# Patient Record
Sex: Female | Born: 1965
Health system: Southern US, Community
[De-identification: ages and names within clinical notes are randomized; demographics above are authoritative.]

## PROBLEM LIST (undated history)

## (undated) ENCOUNTER — Emergency Department (HOSPITAL_BASED_OUTPATIENT_CLINIC_OR_DEPARTMENT_OTHER): Admission: EM | Payer: 59 | Source: Home / Self Care

## (undated) DIAGNOSIS — I10 Essential (primary) hypertension: Secondary | ICD-10-CM

## (undated) DIAGNOSIS — K219 Gastro-esophageal reflux disease without esophagitis: Secondary | ICD-10-CM

## (undated) DIAGNOSIS — K635 Polyp of colon: Secondary | ICD-10-CM

## (undated) DIAGNOSIS — E669 Obesity, unspecified: Secondary | ICD-10-CM

## (undated) DIAGNOSIS — E119 Type 2 diabetes mellitus without complications: Secondary | ICD-10-CM

## (undated) HISTORY — DX: Essential (primary) hypertension: I10

## (undated) HISTORY — DX: Obesity, unspecified: E66.9

## (undated) HISTORY — PX: GASTRIC BYPASS: SHX52

## (undated) HISTORY — DX: Polyp of colon: K63.5

## (undated) HISTORY — PX: AUGMENTATION MAMMAPLASTY: SUR837

## (undated) HISTORY — DX: Gastro-esophageal reflux disease without esophagitis: K21.9

## (undated) HISTORY — DX: Type 2 diabetes mellitus without complications: E11.9

---

## 2020-11-06 DIAGNOSIS — S82851A Displaced trimalleolar fracture of right lower leg, initial encounter for closed fracture: Secondary | ICD-10-CM | POA: Diagnosis not present

## 2020-11-06 DIAGNOSIS — M25571 Pain in right ankle and joints of right foot: Secondary | ICD-10-CM | POA: Diagnosis not present

## 2020-11-07 DIAGNOSIS — S82851A Displaced trimalleolar fracture of right lower leg, initial encounter for closed fracture: Secondary | ICD-10-CM | POA: Diagnosis not present

## 2020-11-07 DIAGNOSIS — S82831A Other fracture of upper and lower end of right fibula, initial encounter for closed fracture: Secondary | ICD-10-CM | POA: Diagnosis not present

## 2020-11-07 DIAGNOSIS — S8251XA Displaced fracture of medial malleolus of right tibia, initial encounter for closed fracture: Secondary | ICD-10-CM | POA: Diagnosis not present

## 2020-11-12 DIAGNOSIS — S82851A Displaced trimalleolar fracture of right lower leg, initial encounter for closed fracture: Secondary | ICD-10-CM | POA: Diagnosis not present

## 2020-11-12 DIAGNOSIS — M25571 Pain in right ankle and joints of right foot: Secondary | ICD-10-CM | POA: Diagnosis not present

## 2020-11-15 DIAGNOSIS — Z6841 Body Mass Index (BMI) 40.0 and over, adult: Secondary | ICD-10-CM | POA: Diagnosis not present

## 2020-11-15 DIAGNOSIS — S93431A Sprain of tibiofibular ligament of right ankle, initial encounter: Secondary | ICD-10-CM | POA: Diagnosis not present

## 2020-11-15 DIAGNOSIS — M25371 Other instability, right ankle: Secondary | ICD-10-CM | POA: Diagnosis not present

## 2020-11-15 DIAGNOSIS — S82851A Displaced trimalleolar fracture of right lower leg, initial encounter for closed fracture: Secondary | ICD-10-CM | POA: Diagnosis not present

## 2020-11-15 DIAGNOSIS — I10 Essential (primary) hypertension: Secondary | ICD-10-CM | POA: Diagnosis not present

## 2020-12-03 DIAGNOSIS — S82831D Other fracture of upper and lower end of right fibula, subsequent encounter for closed fracture with routine healing: Secondary | ICD-10-CM | POA: Diagnosis not present

## 2020-12-03 DIAGNOSIS — S82851D Displaced trimalleolar fracture of right lower leg, subsequent encounter for closed fracture with routine healing: Secondary | ICD-10-CM | POA: Diagnosis not present

## 2020-12-03 DIAGNOSIS — Z09 Encounter for follow-up examination after completed treatment for conditions other than malignant neoplasm: Secondary | ICD-10-CM | POA: Diagnosis not present

## 2020-12-19 ENCOUNTER — Ambulatory Visit: Payer: 59 | Attending: Foot and Ankle Surgery | Admitting: Physical Therapy

## 2020-12-19 ENCOUNTER — Encounter: Payer: Self-pay | Admitting: Physical Therapy

## 2020-12-19 ENCOUNTER — Other Ambulatory Visit: Payer: Self-pay

## 2020-12-19 DIAGNOSIS — R262 Difficulty in walking, not elsewhere classified: Secondary | ICD-10-CM | POA: Diagnosis not present

## 2020-12-19 DIAGNOSIS — R600 Localized edema: Secondary | ICD-10-CM | POA: Insufficient documentation

## 2020-12-19 DIAGNOSIS — M25671 Stiffness of right ankle, not elsewhere classified: Secondary | ICD-10-CM | POA: Insufficient documentation

## 2020-12-19 DIAGNOSIS — M25571 Pain in right ankle and joints of right foot: Secondary | ICD-10-CM | POA: Diagnosis not present

## 2020-12-19 NOTE — Therapy (Signed)
[facility] [address], [address] [place], [place], [address] Phone: [phone]   Fax:  [phone]  Physical Therapy Evaluation  Patient Details  Name: Natasha [name] MRN: [id number] Date of Birth: 1966-03-07 Referring Provider (PT): Macie Burows, MD   Encounter Date: 12/19/2020   PT End of Session - 12/19/20 1456    Visit Number 1    Date for PT Re-Evaluation 02/13/21    Authorization Type [employer]    Authorization Time Period 12/19/20 to 02/13/21    PT Start Time 1158    PT Stop Time 1230    PT Time Calculation (min) 32 min    Activity Tolerance Patient tolerated treatment well;No increased pain    Behavior During Therapy [facility] for tasks assessed/performed           History reviewed. No pertinent past medical history.  History reviewed. No pertinent surgical history.  There were no vitals filed for this visit.    Subjective Assessment - 12/19/20 1201    Subjective Pt states that she fell and broke her ankle. She had surgery 11/15/20. She is now 50% weight bearing and is wearing her CAM boot during the day. Her pain has been minimal.    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 No/denies              [facility] PT Assessment - 12/19/20 0001      Assessment   Medical Diagnosis s/p Rt ankle ORIF    Referring Provider (PT) Macie Burows, MD    Onset Date/Surgical Date 11/15/20    Next MD Visit 12/16/20      Precautions   Precaution Comments ankle: CAM boot with ambulation      Restrictions   Other Position/Activity Restrictions 50% WB with CAM boot, increase to 100% over the next week      Balance Screen   Has the patient fallen in the past 6 months Yes    How many times? 1   injury leading to her surgery   Has the patient had a decrease in activity level because of a fear of falling?  No    Is the patient reluctant to leave their home because of a  fear of falling?  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 her      Cognition   Overall Cognitive Status Within Functional Limits for tasks assessed      ROM / Strength   AROM / PROM / Strength AROM;Strength      AROM   Overall AROM Comments Rt great toe extension 25 deg    AROM Assessment Site Ankle    Right/Left Ankle Right    Right Ankle Dorsiflexion 0    Right Ankle Plantar Flexion 25    Right Ankle Inversion 15      Palpation   Palpation comment tenderness along scar, mild adhesions noted along the scar      Ambulation/Gait   Assistive device L Axillary Crutch    Pre-Gait Activities PT adjustments to crutches for improved UE placement    Gait Comments Pt initially walking with step to pattern holding crutch in Rt arm. Following PT adjustments, she reported improvements in back discomfort and ability to ambulate upright and improved steadiness  Objective measurements completed on examination: See above findings.       [facility] Adult PT Treatment/Exercise - 12/19/20 0001      Exercises   Exercises Ankle      Ankle Exercises: Stretches   Gastroc Stretch 2 reps;30 seconds    Gastroc Stretch Limitations long sitting with strap    Other Stretch Rt plantarflexion stretch 2x30 sec    Other Stretch Rt great toe extension stretch 2x30 sec      Ankle Exercises: Supine   Other Supine Ankle Exercises Rt long sitting: PF with great toe flexion red TB x20 reps                  PT Education - 12/19/20 1454    Education Details crutch placement/set up; implemented and reviewed HEP; ice/elevation parameters; gentle scar massage    Person(s) Educated Patient    Methods Explanation;Handout;Verbal cues    Comprehension Verbalized understanding;Returned demonstration            PT Short Term Goals - 12/19/20 1520      PT SHORT TERM GOAL #1   Title Pt  will be independent with her initial HEP to increase ankle ROM, strength and decrease swelling.    Time 4    Period Weeks    Status New    Target Date 01/16/21      PT SHORT TERM GOAL #2   Title Pt will have atleast 5 deg of ankle dorsiflexion which will improve gait mechanics.    Time 4    Period Weeks    Status New      PT SHORT TERM GOAL #3   Title Pt will be able to demonstrate symmetrical gait mechanics and proper push off on the Rt during ambulation atleast 134ft, without or without AD, once cleared by her MD.    Status New             PT Long Term Goals - 12/19/20 1526      PT LONG TERM GOAL #1   Title Pt will have atleast 10 deg of active ankle DF to allow for safe walking on even surfaces.    Time 8    Period Weeks    Status New      PT LONG TERM GOAL #2   Title Pt will be able to maintain Rt single leg stance for atleast 5 sec, 2/3 trials, which will reflect improvements in ankle/foot proprioception.    Time 8    Period Weeks    Status New      PT LONG TERM GOAL #3   Title Pt will have greater than 45 deg of plantar flexion on the Rt to improve push off during ambulation.    Time 8    Period Weeks    Status New      PT LONG TERM GOAL #4   Title Pt will have improved gastroc strength, evident by her ability to complete 15 single leg heel raises on the Rt.    Time 8    Period Weeks    Status New      PT LONG TERM GOAL #5   Title Pt will be able to complete 5x sit to stand in less than 14 sec without the need for UE support and without significant weight shift off the Rt LE.    Time 8    Period Weeks    Status New  Plan - 12/19/20 1459    Clinical Impression Statement Pt is a pleasant 55 y.o F referred to OPPT s/p Rt ankle ORIF on Nov 15, 2020. Pt's pain is well controlled and she presents wearing her CAM boot. She post surgical swelling, limited ankle/foot ROM and functional weakness. Pt has dorsiflexion to neutral. She was able  to complete a series of exercises to increase ankle ROM and strength. PT also educated pt on ice/elevation and scar mobilization. PT made several adjustments to the pt's crutches to improve her gait mechanics. Pt works as a Engineer, civil (consulting) and would benefit from skilled PT to decrease post surgical swelling and address her limitations in ROM, strength and proprioception to facilitate her return to work.    Personal Factors and Comorbidities Fitness;Past/Current Experience    Examination-Activity Limitations Stairs    Stability/Clinical Decision Making Stable/Uncomplicated    Clinical Decision Making Low    Rehab Potential Excellent    PT Frequency 2x / week    PT Duration 8 weeks    PT Treatment/Interventions ADLs/Self Care Home Management;Aquatic Therapy;Cryotherapy;Stair training;Gait training;Functional mobility training;Therapeutic activities;Therapeutic exercise;Balance training;Neuromuscular re-education;Patient/family education;Dry needling;Scar mobilization;Manual techniques;Passive range of motion;Taping;Vasopneumatic Device    PT Next Visit Plan ankle ROM, progress stretches and some activities to standing if WB restrictions allow, intrinsice foot strength, gait mechanics review    PT Home Exercise Plan [email or web address]    Consulted and Agree with Plan of Care Patient           Patient will benefit from skilled therapeutic intervention in order to improve the following deficits and impairments:  Abnormal gait,Decreased activity tolerance,Hypomobility,Decreased strength,Decreased skin integrity,Decreased range of motion,Pain,Decreased balance,Decreased mobility,Difficulty walking,Increased edema,Increased muscle spasms,Decreased scar mobility  Visit Diagnosis: Stiffness of right ankle, not elsewhere classified  Pain in right ankle and joints of right foot  Localized edema  Difficulty in walking, not elsewhere classified     Problem List There are no problems to display for this  patient.   3:38 PM,12/19/20 Donita Brooks PT, DPT [facility]  [phone]  [facility] [address], [address] [place], [place], [address] Phone: [phone]   Fax:  [phone]  Name: Natasha [name] MRN: [id number] Date of Birth: 09-28-1966

## 2020-12-19 NOTE — Patient Instructions (Signed)
Access Code: [email or web address] URL: [email or web address] Date: 12/19/2020 Prepared by: [facility]  Exercises      Long Sitting Calf Stretch with Strap - 2-3 x daily - 7 x weekly - 2 sets - 30 seconds hold     Seated Ankle Plantarflexion Dorsiflexion PROM - 2-3 x daily - 7 x weekly - 2 sets - 30 second hold     Seated Self Great Toe Stretch - 2-3 x daily - 7 x weekly - 2 sets - 30 second hold     Ankle and Toe Plantarflexion with Resistance - 1-2 x daily - 7 x weekly - 1 sets - 20 reps     Seated Ankle Circles - 1 x daily - 7 x weekly - 3 sets - 10 reps   [facility] [address], [address] [place], [place] [address] Phone # [phone] Fax [phone]

## 2020-12-26 ENCOUNTER — Ambulatory Visit: Payer: 59 | Admitting: Physical Therapy

## 2020-12-27 ENCOUNTER — Other Ambulatory Visit (HOSPITAL_COMMUNITY): Payer: Self-pay | Admitting: Foot and Ankle Surgery

## 2020-12-30 ENCOUNTER — Ambulatory Visit: Payer: 59 | Admitting: Physical Therapy

## 2020-12-30 ENCOUNTER — Other Ambulatory Visit: Payer: Self-pay

## 2020-12-30 ENCOUNTER — Encounter: Payer: Self-pay | Admitting: Physical Therapy

## 2020-12-30 DIAGNOSIS — R262 Difficulty in walking, not elsewhere classified: Secondary | ICD-10-CM

## 2020-12-30 DIAGNOSIS — R600 Localized edema: Secondary | ICD-10-CM | POA: Diagnosis not present

## 2020-12-30 DIAGNOSIS — M25571 Pain in right ankle and joints of right foot: Secondary | ICD-10-CM | POA: Diagnosis not present

## 2020-12-30 DIAGNOSIS — M25671 Stiffness of right ankle, not elsewhere classified: Secondary | ICD-10-CM

## 2020-12-30 NOTE — Therapy (Signed)
[facility] [address] [place], [place], [address] Phone: [phone]   Fax:  [phone]  Physical Therapy Treatment  Patient Details  Name: Natasha [name] MRN: [id number] Date of Birth: Dec 19, 1965 Referring Provider (PT): Macie Burows, MD   Encounter Date: 12/30/2020   PT End of Session - 12/30/20 1450    Visit Number 2    Date for PT Re-Evaluation 02/13/21    Authorization Type [employer]    Authorization Time Period 12/19/20 to 02/13/21    PT Start Time 1446    PT Stop Time 1535    PT Time Calculation (min) 49 min    Activity Tolerance Patient tolerated treatment well;No increased pain    Behavior During Therapy [facility] for tasks assessed/performed           History reviewed. No pertinent past medical history.  History reviewed. No pertinent surgical history.  There were no vitals filed for this visit.   Subjective Assessment - 12/30/20 1452    Subjective Weightbearing with 100% weight in boot, uses crutch mainly as cane to get up slight inclines. Mostly stiff and achey in the AM and when she sits for long time.    Currently in Pain? No/denies    Multiple Pain Sites No              [facility] PT Assessment - 12/30/20 0001      AROM   AROM Assessment Site Ankle    Right/Left Ankle Right    Right Ankle Dorsiflexion --   5                        [facility] Adult PT Treatment/Exercise - 12/30/20 0001      Vasopneumatic   Number Minutes Vasopneumatic  --   15   Vasopnuematic Location  Ankle    Vasopneumatic Pressure Medium    Vasopneumatic Temperature  3 flakes      Manual Therapy   Manual Therapy Soft tissue mobilization    Soft tissue mobilization Scar mobs, achilles soft tissue      Ankle Exercises: Stretches   Gastroc Stretch 3 reps;30 seconds   used towel   Slant Board Stretch --   5 min Df/PF     Ankle Exercises: Seated   BAPS Sitting;Level 2   2x10 VC to keep trunk  still: all planes                   PT Short Term Goals - 12/19/20 1520      PT SHORT TERM GOAL #1   Title Pt will be independent with her initial HEP to increase ankle ROM, strength and decrease swelling.    Time 4    Period Weeks    Status New    Target Date 01/16/21      PT SHORT TERM GOAL #2   Title Pt will have atleast 5 deg of ankle dorsiflexion which will improve gait mechanics.    Time 4    Period Weeks    Status New      PT SHORT TERM GOAL #3   Title Pt will be able to demonstrate symmetrical gait mechanics and proper push off on the Rt during ambulation atleast 146ft, without or without AD, once cleared by her MD.    Status New             PT Long Term Goals - 12/19/20 1526  PT LONG TERM GOAL #1   Title Pt will have atleast 10 deg of active ankle DF to allow for safe walking on even surfaces.    Time 8    Period Weeks    Status New      PT LONG TERM GOAL #2   Title Pt will be able to maintain Rt single leg stance for atleast 5 sec, 2/3 trials, which will reflect improvements in ankle/foot proprioception.    Time 8    Period Weeks    Status New      PT LONG TERM GOAL #3   Title Pt will have greater than 45 deg of plantar flexion on the Rt to improve push off during ambulation.    Time 8    Period Weeks    Status New      PT LONG TERM GOAL #4   Title Pt will have improved gastroc strength, evident by her ability to complete 15 single leg heel raises on the Rt.    Time 8    Period Weeks    Status New      PT LONG TERM GOAL #5   Title Pt will be able to complete 5x sit to stand in less than 14 sec without the need for UE support and without significant weight shift off the Rt LE.    Time 8    Period Weeks    Status New                 Plan - 12/30/20 1451    Clinical Impression Statement Pt is independent in initial HEP. She is still in th e CAM boot until next MD appt on Monday. She only uses the crutch more as a cane when she  has to walk up a lisght incline or decline. Ankle remains with edema but showing progress with her ROM: see chart for measurements.    Personal Factors and Comorbidities Fitness;Past/Current Experience    Examination-Activity Limitations Stairs    Stability/Clinical Decision Making Stable/Uncomplicated    Rehab Potential Excellent    PT Frequency 2x / week    PT Duration 8 weeks    PT Treatment/Interventions ADLs/Self Care Home Management;Aquatic Therapy;Cryotherapy;Stair training;Gait training;Functional mobility training;Therapeutic activities;Therapeutic exercise;Balance training;Neuromuscular re-education;Patient/family education;Dry needling;Scar mobilization;Manual techniques;Passive range of motion;Taping;Vasopneumatic Device    PT Next Visit Plan Take measurements to send to MD, seated BAPS for AROM, gait if ok to be out of boot for weighbearing. Game Ready for edema    PT Home Exercise Plan [email or web address]    Consulted and Agree with Plan of Care Patient           Patient will benefit from skilled therapeutic intervention in order to improve the following deficits and impairments:  Abnormal gait,Decreased activity tolerance,Hypomobility,Decreased strength,Decreased skin integrity,Decreased range of motion,Pain,Decreased balance,Decreased mobility,Difficulty walking,Increased edema,Increased muscle spasms,Decreased scar mobility  Visit Diagnosis: Stiffness of right ankle, not elsewhere classified  Pain in right ankle and joints of right foot  Localized edema  Difficulty in walking, not elsewhere classified     Problem List There are no problems to display for this patient.   Dalynn Jhaveri, pta 12/30/2020, 3:38 PM  [facility] [address], [address] [place], [place], [address] Phone: [phone]   Fax:  [phone]  Name: Natasha [name] MRN: [id number] Date of Birth: Feb 05, 1966

## 2021-01-01 ENCOUNTER — Encounter: Payer: Self-pay | Admitting: Physical Therapy

## 2021-01-01 ENCOUNTER — Ambulatory Visit: Payer: 59 | Admitting: Physical Therapy

## 2021-01-01 ENCOUNTER — Other Ambulatory Visit: Payer: Self-pay

## 2021-01-01 DIAGNOSIS — R600 Localized edema: Secondary | ICD-10-CM

## 2021-01-01 DIAGNOSIS — R262 Difficulty in walking, not elsewhere classified: Secondary | ICD-10-CM | POA: Diagnosis not present

## 2021-01-01 DIAGNOSIS — M25571 Pain in right ankle and joints of right foot: Secondary | ICD-10-CM

## 2021-01-01 DIAGNOSIS — M25671 Stiffness of right ankle, not elsewhere classified: Secondary | ICD-10-CM

## 2021-01-01 NOTE — Therapy (Signed)
[facility] [address], [address] [place], [place], [address] Phone: [phone]   Fax:  [phone]  Physical Therapy Treatment  Patient Details  Name: Natasha [name] MRN: [id number] Date of Birth: [birth year] Referring Provider (PT): Macie Burows, MD   Encounter Date: 01/01/2021   PT End of Session - 01/01/21 0809    Visit Number 3    Date for PT Re-Evaluation 02/13/21    Authorization Type [employer]    Authorization Time Period 12/19/20 to 02/13/21    PT Start Time 0808    PT Stop Time 0855   pt late   PT Time Calculation (min) 47 min    Activity Tolerance Patient tolerated treatment well;No increased pain    Behavior During Therapy [facility] for tasks assessed/performed           History reviewed. No pertinent past medical history.  History reviewed. No pertinent surgical history.  There were no vitals filed for this visit.   Subjective Assessment - 01/01/21 0810    Subjective No increased pain with treatment, arrives with a "stiff and achey" ankle. Pt also not wearing CAM boot or using crutch. Reports it doesn't hurt to do that.    Currently in Pain? --   Stiff and a little achey (2/10) at ankle   Multiple Pain Sites No              [facility] PT Assessment - 01/01/21 0001      Assessment   Medical Diagnosis s/p Rt ankle ORIF    Referring Provider (PT) Macie Burows, MD      AROM   AROM Assessment Site Ankle    Right Ankle Dorsiflexion --   5   Right Ankle Plantar Flexion --   30   Right Ankle Inversion 20                         [facility] Adult PT Treatment/Exercise - 01/01/21 0001      Vasopneumatic   Number Minutes Vasopneumatic  --   15   Vasopnuematic Location  Ankle    Vasopneumatic Pressure Medium    Vasopneumatic Temperature  3 flakes      Manual Therapy   Manual Therapy Soft tissue mobilization;Passive ROM    Soft tissue mobilization Scar mobs, achilles soft tissue     Passive ROM All planes with end range stretch      Ankle Exercises: Seated   BAPS Sitting;Level 2   2 min in each direction   Other Seated Ankle Exercises isometric inv/eversion with ball 10x each 5 sec hold      Ankle Exercises: Stretches   Other Stretch Green soft ball rolling to bottom of foot. 2 min    Other Stretch Toe flex/ext over Lax ball 20x                    PT Short Term Goals - 01/01/21 7782      PT SHORT TERM GOAL #1   Title Pt will be independent with her initial HEP to increase ankle ROM, strength and decrease swelling.    Time 4    Period Weeks    Status Achieved    Target Date 01/16/21      PT SHORT TERM GOAL #2   Title Pt will have atleast 5 deg of ankle dorsiflexion which will improve gait mechanics.    Period Weeks    Status Achieved  PT Long Term Goals - 12/19/20 1526      PT LONG TERM GOAL #1   Title Pt will have atleast 10 deg of active ankle DF to allow for safe walking on even surfaces.    Time 8    Period Weeks    Status New      PT LONG TERM GOAL #2   Title Pt will be able to maintain Rt single leg stance for atleast 5 sec, 2/3 trials, which will reflect improvements in ankle/foot proprioception.    Time 8    Period Weeks    Status New      PT LONG TERM GOAL #3   Title Pt will have greater than 45 deg of plantar flexion on the Rt to improve push off during ambulation.    Time 8    Period Weeks    Status New      PT LONG TERM GOAL #4   Title Pt will have improved gastroc strength, evident by her ability to complete 15 single leg heel raises on the Rt.    Time 8    Period Weeks    Status New      PT LONG TERM GOAL #5   Title Pt will be able to complete 5x sit to stand in less than 14 sec without the need for UE support and without significant weight shift off the Rt LE.    Time 8    Period Weeks    Status New                 Plan - 01/01/21 9563    Clinical Impression Statement Pt arrives with just  a little "ache " in her ankle. Edema appears better than Monday, but it is early in the AM and pt "has not done anything yet today." AROM measurements improved by 5 degrees in all planes, probably due to decreased edema. pt will see MD Monday.    Personal Factors and Comorbidities Fitness;Past/Current Experience    Examination-Activity Limitations Stairs    Stability/Clinical Decision Making Stable/Uncomplicated    Rehab Potential Excellent    PT Frequency 2x / week    PT Duration 8 weeks    PT Treatment/Interventions ADLs/Self Care Home Management;Aquatic Therapy;Cryotherapy;Stair training;Gait training;Functional mobility training;Therapeutic activities;Therapeutic exercise;Balance training;Neuromuscular re-education;Patient/family education;Dry needling;Scar mobilization;Manual techniques;Passive range of motion;Taping;Vasopneumatic Device    PT Next Visit Plan Check to see if MD lifted restrictions, progress per MD recommendation    PT Home Exercise Plan [email or web address]    Consulted and Agree with Plan of Care Patient           Patient will benefit from skilled therapeutic intervention in order to improve the following deficits and impairments:  Abnormal gait,Decreased activity tolerance,Hypomobility,Decreased strength,Decreased skin integrity,Decreased range of motion,Pain,Decreased balance,Decreased mobility,Difficulty walking,Increased edema,Increased muscle spasms,Decreased scar mobility  Visit Diagnosis: Stiffness of right ankle, not elsewhere classified  Pain in right ankle and joints of right foot  Localized edema  Difficulty in walking, not elsewhere classified     Problem List There are no problems to display for this patient.   Deshawn Witty, PTA 01/01/2021, 9:08 AM  [facility] [address], [address] [place], [place], [address] Phone: [phone]   Fax:  [phone]  Name: Natasha [name] MRN: [id number] Date of  Birth: 01/19/1966

## 2021-01-06 ENCOUNTER — Encounter: Payer: 59 | Admitting: Physical Therapy

## 2021-01-06 DIAGNOSIS — M7731 Calcaneal spur, right foot: Secondary | ICD-10-CM | POA: Diagnosis not present

## 2021-01-06 DIAGNOSIS — M7989 Other specified soft tissue disorders: Secondary | ICD-10-CM | POA: Diagnosis not present

## 2021-01-06 DIAGNOSIS — Z09 Encounter for follow-up examination after completed treatment for conditions other than malignant neoplasm: Secondary | ICD-10-CM | POA: Diagnosis not present

## 2021-01-09 ENCOUNTER — Ambulatory Visit: Payer: 59 | Attending: Foot and Ankle Surgery

## 2021-01-09 ENCOUNTER — Other Ambulatory Visit: Payer: Self-pay

## 2021-01-09 DIAGNOSIS — M25671 Stiffness of right ankle, not elsewhere classified: Secondary | ICD-10-CM | POA: Insufficient documentation

## 2021-01-09 DIAGNOSIS — M25571 Pain in right ankle and joints of right foot: Secondary | ICD-10-CM | POA: Diagnosis not present

## 2021-01-09 DIAGNOSIS — R262 Difficulty in walking, not elsewhere classified: Secondary | ICD-10-CM | POA: Diagnosis not present

## 2021-01-09 DIAGNOSIS — R600 Localized edema: Secondary | ICD-10-CM | POA: Diagnosis not present

## 2021-01-09 NOTE — Therapy (Signed)
[facility] [address] [place], [place], [address] Phone: [phone]   Fax:  [phone]  Physical Therapy Treatment  Patient Details  Name: Natasha [name] MRN: [id number] Date of Birth: 1966-05-09 Referring Provider (PT): Macie Burows, MD   Encounter Date: 01/09/2021   PT End of Session - 01/09/21 1319    Visit Number 4    Date for PT Re-Evaluation 02/13/21    Authorization Type [employer]    Authorization Time Period 12/19/20 to 02/13/21    PT Start Time 1242   late   PT Stop Time 1317    PT Time Calculation (min) 35 min    Activity Tolerance Patient tolerated treatment well;No increased pain    Behavior During Therapy [facility] for tasks assessed/performed           History reviewed. No pertinent past medical history.  History reviewed. No pertinent surgical history.  There were no vitals filed for this visit.   Subjective Assessment - 01/09/21 1243    Subjective Went to MD yesterday and he cleared to be WB without the boot. X-ray was good.    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 No/denies   pain when walking out of boot.                            [facility] Adult PT Treatment/Exercise - 01/09/21 0001      Ambulation/Gait   Ambulation/Gait Yes    Ambulation/Gait Assistance 6: Modified independent (Device/Increase time)    Pre-Gait Activities heel strike and toe off at countertop, weightshifting side to side    Gait Comments Gait training with standard cane on the Lt- emphasis on symmetry.      Exercises   Exercises Knee/Hip      Knee/Hip Exercises: Standing   Heel Raises Both;3 sets;5 reps    Forward Step Up Right;2 sets;10 reps;Step Height: 6";Hand Hold: 2      Knee/Hip Exercises: Seated   Sit to Sand 10 reps;1 set   focus on Rt=Lt weightbearing     Ankle Exercises: Seated   BAPS Sitting;Level 2   2 min in each direction                  PT Education - 01/09/21 1301    Education Details Access Code: [email or web address]    Person(s) Educated Patient    Methods Explanation;Demonstration;Handout    Comprehension Verbalized understanding;Returned demonstration            PT Short Term Goals - 01/01/21 0842      PT SHORT TERM GOAL #1   Title Pt will be independent with her initial HEP to increase ankle ROM, strength and decrease swelling.    Time 4    Period Weeks    Status Achieved    Target Date 01/16/21      PT SHORT TERM GOAL #2   Title Pt will have atleast 5 deg of ankle dorsiflexion which will improve gait mechanics.    Period Weeks    Status Achieved             PT Long Term Goals - 12/19/20 1526      PT LONG TERM GOAL #1   Title Pt will have atleast 10 deg of active ankle DF to allow for safe walking on even surfaces.    Time 8  Period Weeks    Status New      PT LONG TERM GOAL #2   Title Pt will be able to maintain Rt single leg stance for atleast 5 sec, 2/3 trials, which will reflect improvements in ankle/foot proprioception.    Time 8    Period Weeks    Status New      PT LONG TERM GOAL #3   Title Pt will have greater than 45 deg of plantar flexion on the Rt to improve push off during ambulation.    Time 8    Period Weeks    Status New      PT LONG TERM GOAL #4   Title Pt will have improved gastroc strength, evident by her ability to complete 15 single leg heel raises on the Rt.    Time 8    Period Weeks    Status New      PT LONG TERM GOAL #5   Title Pt will be able to complete 5x sit to stand in less than 14 sec without the need for UE support and without significant weight shift off the Rt LE.    Time 8    Period Weeks    Status New                 Plan - 01/09/21 1305    Clinical Impression Statement Pt saw MD this week with orders to transition out of the boot into normal shoe as able and no ROM restrictions.  Goal is to return to work on 02/12/21.   X-ray showed that hardware is intact and healing well.  PT worked on gait with cane and weight shifting to improve gait pattern and reduce compensatory motion.  Pt did very well with initial weight bearing session.  Pt requires tactile and verbal cues for hip alignment with standing exercise.  Pt will work on getting a cane and compression garments to control swelling.  Pt will continue to benefit from skilled PT to address Rt ankle strength, flexibility, gait and strength/endurance needed to return to work.    PT Frequency 2x / week    PT Duration 8 weeks    PT Treatment/Interventions ADLs/Self Care Home Management;Aquatic Therapy;Cryotherapy;Stair training;Gait training;Functional mobility training;Therapeutic activities;Therapeutic exercise;Balance training;Neuromuscular re-education;Patient/family education;Dry needling;Scar mobilization;Manual techniques;Passive range of motion;Taping;Vasopneumatic Device    PT Next Visit Plan work on weightbearing progression, flexiblity and strength progression    PT Home Exercise Plan [email or web address]    Consulted and Agree with Plan of Care Patient           Patient will benefit from skilled therapeutic intervention in order to improve the following deficits and impairments:  Abnormal gait,Decreased activity tolerance,Hypomobility,Decreased strength,Decreased skin integrity,Decreased range of motion,Pain,Decreased balance,Decreased mobility,Difficulty walking,Increased edema,Increased muscle spasms,Decreased scar mobility  Visit Diagnosis: Stiffness of right ankle, not elsewhere classified  Pain in right ankle and joints of right foot  Localized edema  Difficulty in walking, not elsewhere classified     Problem List There are no problems to display for this patient.   Lorrene Reid, PT 01/09/21 1:20 PM  [facility] [address], [address] [place], [place], [address] Phone: [phone]   Fax:   [phone]  Name: Natasha [name] MRN: [id number] Date of Birth: 1966-04-10

## 2021-01-09 NOTE — Patient Instructions (Signed)
Access Code: [email or web address] URL: [email or web address] Date: 01/09/2021 Prepared by: Tresa Endo  Exercises  Side to Side Weight Shift with Counter Support - 2 x daily - 7 x weekly - 2 sets - 10 reps Staggered Stance Forward Backward Weight Shift with Counter Support - 2 x daily - 7 x weekly - 2 sets - 10 reps Heel rises with counter support - 1 x daily - 7 x weekly - 2-3 sets - 5 reps Gastroc Stretch on Wall - 2 x daily - 7 x weekly - 1 sets - 3 reps - 20 hold

## 2021-01-13 ENCOUNTER — Other Ambulatory Visit: Payer: Self-pay

## 2021-01-13 ENCOUNTER — Ambulatory Visit: Payer: 59

## 2021-01-13 DIAGNOSIS — R262 Difficulty in walking, not elsewhere classified: Secondary | ICD-10-CM

## 2021-01-13 DIAGNOSIS — M25571 Pain in right ankle and joints of right foot: Secondary | ICD-10-CM

## 2021-01-13 DIAGNOSIS — M25671 Stiffness of right ankle, not elsewhere classified: Secondary | ICD-10-CM

## 2021-01-13 DIAGNOSIS — R600 Localized edema: Secondary | ICD-10-CM | POA: Diagnosis not present

## 2021-01-13 NOTE — Therapy (Signed)
[facility] [address] [place], [place], [address] Phone: [phone]   Fax:  [phone]  Physical Therapy Treatment  Patient Details  Name: Natasha [name] MRN: [id number] Date of Birth: November 16, 1966 Referring Provider (PT): Macie Burows, MD   Encounter Date: 01/13/2021   PT End of Session - 01/13/21 1227    Visit Number 5    Date for PT Re-Evaluation 02/13/21    Authorization Type [employer]    PT Start Time 1146    PT Stop Time 1241    PT Time Calculation (min) 55 min    Activity Tolerance Patient tolerated treatment well;No increased pain    Behavior During Therapy [facility] for tasks assessed/performed           History reviewed. No pertinent past medical history.  History reviewed. No pertinent surgical history.  There were no vitals filed for this visit.   Subjective Assessment - 01/13/21 1151    Subjective I am doing my exercises and working on my gait.    Currently in Pain? No/denies                             [facility] Adult PT Treatment/Exercise - 01/13/21 0001      Ambulation/Gait   Pre-Gait Activities heel strike, step through, toe off at edge of treadmill    Gait Comments .      Knee/Hip Exercises: Standing   Heel Raises Both;3 sets;5 reps    Forward Step Up Right;2 sets;10 reps;Step Height: 6";Hand Hold: 2      Vasopneumatic   Number Minutes Vasopneumatic  --   15   Vasopnuematic Location  Ankle    Vasopneumatic Pressure Medium    Vasopneumatic Temperature  3 flakes      Manual Therapy   Manual Therapy Soft tissue mobilization;Passive ROM    Soft tissue mobilization Scar mobs, achilles soft tissue    Passive ROM All planes with end range stretch      Ankle Exercises: Seated   BAPS Sitting;Level 2   2 min in each direction     Ankle Exercises: Stretches   Slant Board Stretch 3 reps;10 seconds               Balance Exercises - 01/13/21 0001       Balance Exercises: Standing   Tandem Stance Eyes open;2 reps;15 secs    SLS 5 reps;10 secs;Upper extremity support 1               PT Short Term Goals - 01/13/21 1152      PT SHORT TERM GOAL #1   Title Pt will be independent with her initial HEP to increase ankle ROM, strength and decrease swelling.    Status Achieved             PT Long Term Goals - 12/19/20 1526      PT LONG TERM GOAL #1   Title Pt will have atleast 10 deg of active ankle DF to allow for safe walking on even surfaces.    Time 8    Period Weeks    Status New      PT LONG TERM GOAL #2   Title Pt will be able to maintain Rt single leg stance for atleast 5 sec, 2/3 trials, which will reflect improvements in ankle/foot proprioception.    Time 8    Period Weeks    Status  New      PT LONG TERM GOAL #3   Title Pt will have greater than 45 deg of plantar flexion on the Rt to improve push off during ambulation.    Time 8    Period Weeks    Status New      PT LONG TERM GOAL #4   Title Pt will have improved gastroc strength, evident by her ability to complete 15 single leg heel raises on the Rt.    Time 8    Period Weeks    Status New      PT LONG TERM GOAL #5   Title Pt will be able to complete 5x sit to stand in less than 14 sec without the need for UE support and without significant weight shift off the Rt LE.    Time 8    Period Weeks    Status New                 Plan - 01/13/21 1210    Clinical Impression Statement Pt is doing well with weightbearing exercise. Gait symmetry is improved although remains antalgic with longer distances.  Pt with improved muscle activation with heel raises and step-ups on the Rt with reduced substitution.   Pt is challenged with proprioceptive tasks consistent with coming out of a boot and required minor tactile and verbal cues for alignment with exercise.  Pt with continued edema and responded well to manual therapy and Game ready to improve mobility and  reduce edema.    PT Frequency 2x / week    PT Duration 8 weeks    PT Treatment/Interventions ADLs/Self Care Home Management;Aquatic Therapy;Cryotherapy;Stair training;Gait training;Functional mobility training;Therapeutic activities;Therapeutic exercise;Balance training;Neuromuscular re-education;Patient/family education;Dry needling;Scar mobilization;Manual techniques;Passive range of motion;Taping;Vasopneumatic Device    PT Next Visit Plan work on weightbearing progression, flexiblity and strength progression.  Proprioception and edema management    PT Home Exercise Plan [email or web address]    Consulted and Agree with Plan of Care Patient           Patient will benefit from skilled therapeutic intervention in order to improve the following deficits and impairments:  Abnormal gait,Decreased activity tolerance,Hypomobility,Decreased strength,Decreased skin integrity,Decreased range of motion,Pain,Decreased balance,Decreased mobility,Difficulty walking,Increased edema,Increased muscle spasms,Decreased scar mobility  Visit Diagnosis: Pain in right ankle and joints of right foot  Stiffness of right ankle, not elsewhere classified  Localized edema  Difficulty in walking, not elsewhere classified     Problem List There are no problems to display for this patient.   Lorrene Reid, PT 01/13/21 12:28 PM  [facility] [address] [place], [place], [address] Phone: [phone]   Fax:  [phone]  Name: Natasha [name] MRN: [id number] Date of Birth: Oct 22, 1966

## 2021-01-15 ENCOUNTER — Encounter: Payer: Self-pay | Admitting: Physical Therapy

## 2021-01-15 ENCOUNTER — Other Ambulatory Visit: Payer: Self-pay

## 2021-01-15 ENCOUNTER — Ambulatory Visit: Payer: 59 | Admitting: Physical Therapy

## 2021-01-15 DIAGNOSIS — R600 Localized edema: Secondary | ICD-10-CM

## 2021-01-15 DIAGNOSIS — M25571 Pain in right ankle and joints of right foot: Secondary | ICD-10-CM | POA: Diagnosis not present

## 2021-01-15 DIAGNOSIS — M25671 Stiffness of right ankle, not elsewhere classified: Secondary | ICD-10-CM

## 2021-01-15 DIAGNOSIS — R262 Difficulty in walking, not elsewhere classified: Secondary | ICD-10-CM | POA: Diagnosis not present

## 2021-01-15 NOTE — Therapy (Signed)
[facility] [address], [address] [place], [place], [address] Phone: [phone]   Fax:  [phone]  Physical Therapy Treatment  Patient Details  Name: Natasha [name] MRN: [id number] Date of Birth: [birth year] Referring Provider (PT): Macie Burows, MD   Encounter Date: 01/15/2021   PT End of Session - 01/15/21 1146    Visit Number 6    Date for PT Re-Evaluation 02/13/21    Authorization Type [employer]    Authorization Time Period 12/19/20 to 02/13/21    PT Start Time 1145    PT Stop Time 1232    PT Time Calculation (min) 47 min    Activity Tolerance Patient tolerated treatment well;No increased pain    Behavior During Therapy [facility] for tasks assessed/performed           History reviewed. No pertinent past medical history.  History reviewed. No pertinent surgical history.  There were no vitals filed for this visit.   Subjective Assessment - 01/15/21 1147    Subjective Stiff ankle this AM. Pt bought and is wearing an ankle compression sleeve today.    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 No/denies   Stiff ankle   Multiple Pain Sites No                             [facility] Adult PT Treatment/Exercise - 01/15/21 0001      Vasopneumatic   Number Minutes Vasopneumatic  --   15   Vasopnuematic Location  Ankle    Vasopneumatic Pressure Medium    Vasopneumatic Temperature  3 flakes      Ankle Exercises: Seated   BAPS Sitting;Level 3   20x each   Other Seated Ankle Exercises Rocker board warm up 5 min ankle DF/PF      Ankle Exercises: Stretches   Slant Board Stretch 3 reps;20 seconds      Ankle Exercises: Standing   Rebounder weight shifting 3 ways 1 min    Heel Raises Both;10 reps   2 sets   Other Standing Ankle Exercises Df stretch on second step  20x   2 sets                   PT Short Term Goals - 01/13/21 1152      PT  SHORT TERM GOAL #1   Title Pt will be independent with her initial HEP to increase ankle ROM, strength and decrease swelling.    Status Achieved             PT Long Term Goals - 12/19/20 1526      PT LONG TERM GOAL #1   Title Pt will have atleast 10 deg of active ankle DF to allow for safe walking on even surfaces.    Time 8    Period Weeks    Status New      PT LONG TERM GOAL #2   Title Pt will be able to maintain Rt single leg stance for atleast 5 sec, 2/3 trials, which will reflect improvements in ankle/foot proprioception.    Time 8    Period Weeks    Status New      PT LONG TERM GOAL #3   Title Pt will have greater than 45 deg of plantar flexion on the Rt to improve push off during ambulation.    Time 8  Period Weeks    Status New      PT LONG TERM GOAL #4   Title Pt will have improved gastroc strength, evident by her ability to complete 15 single leg heel raises on the Rt.    Time 8    Period Weeks    Status New      PT LONG TERM GOAL #5   Title Pt will be able to complete 5x sit to stand in less than 14 sec without the need for UE support and without significant weight shift off the Rt LE.    Time 8    Period Weeks    Status New                 Plan - 01/15/21 1146    Clinical Impression Statement Pt arrives with chief complaint of ankle stiffness. pt successfully performed a combination of ROM and stretching exercises followed by weightbearing ankle strengthening and stabilization. pt had no pain with any exercises and reported a feeling of "loosening" as she stretched. Continue to use the Game Ready for ankle edema.    Personal Factors and Comorbidities Fitness;Past/Current Experience    Examination-Activity Limitations Stairs    Stability/Clinical Decision Making Stable/Uncomplicated    Rehab Potential Excellent    PT Frequency 2x / week    PT Duration 8 weeks    PT Treatment/Interventions ADLs/Self Care Home Management;Aquatic  Therapy;Cryotherapy;Stair training;Gait training;Functional mobility training;Therapeutic activities;Therapeutic exercise;Balance training;Neuromuscular re-education;Patient/family education;Dry needling;Scar mobilization;Manual techniques;Passive range of motion;Taping;Vasopneumatic Device    PT Next Visit Plan work on weightbearing progression, flexiblity and strength progression.  Proprioception and edema management    PT Home Exercise Plan [email or web address]    Consulted and Agree with Plan of Care Patient           Patient will benefit from skilled therapeutic intervention in order to improve the following deficits and impairments:  Abnormal gait,Decreased activity tolerance,Hypomobility,Decreased strength,Decreased skin integrity,Decreased range of motion,Pain,Decreased balance,Decreased mobility,Difficulty walking,Increased edema,Increased muscle spasms,Decreased scar mobility  Visit Diagnosis: Pain in right ankle and joints of right foot  Stiffness of right ankle, not elsewhere classified  Localized edema  Difficulty in walking, not elsewhere classified     Problem List There are no problems to display for this patient.   Octavius Shin, PTA 01/15/2021, 12:20 PM  [facility] [address], [address] [place], [place], [address] Phone: [phone]   Fax:  [phone]  Name: Natasha [name] MRN: [id number] Date of Birth: Sep 14, 1966

## 2021-01-20 ENCOUNTER — Other Ambulatory Visit: Payer: Self-pay

## 2021-01-20 ENCOUNTER — Ambulatory Visit: Payer: 59

## 2021-01-20 DIAGNOSIS — M25671 Stiffness of right ankle, not elsewhere classified: Secondary | ICD-10-CM

## 2021-01-20 DIAGNOSIS — R262 Difficulty in walking, not elsewhere classified: Secondary | ICD-10-CM | POA: Diagnosis not present

## 2021-01-20 DIAGNOSIS — R600 Localized edema: Secondary | ICD-10-CM

## 2021-01-20 DIAGNOSIS — M25571 Pain in right ankle and joints of right foot: Secondary | ICD-10-CM | POA: Diagnosis not present

## 2021-01-20 NOTE — Therapy (Signed)
[facility] [address] [place], [place], [address] Phone: [phone]   Fax:  [phone]  Physical Therapy Treatment  Patient Details  Name: Natasha [name] MRN: [id number] Date of Birth: [birth year] Referring Provider (PT): Macie Burows, MD   Encounter Date: 01/20/2021   PT End of Session - 01/20/21 1222    Visit Number 7    Date for PT Re-Evaluation 02/13/21    Authorization Type [employer]    PT Start Time 1143    PT Stop Time 1237    PT Time Calculation (min) 54 min    Activity Tolerance Patient tolerated treatment well;No increased pain    Behavior During Therapy [facility] for tasks assessed/performed           History reviewed. No pertinent past medical history.  History reviewed. No pertinent surgical history.  There were no vitals filed for this visit.   Subjective Assessment - 01/20/21 1143    Subjective I feel good once I get past the initial stiffness after I get up and moving.  I have to remember to elevate and ice when I have been on my leg all day.    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 No/denies   stiffness                            [facility] Adult PT Treatment/Exercise - 01/20/21 0001      Knee/Hip Exercises: Stretches   Gastroc Stretch Right;2 reps;20 seconds      Knee/Hip Exercises: Aerobic   Nustep Level 2x 10 minutes-PT present to discuss progress      Knee/Hip Exercises: Standing   Heel Raises Both;2 sets;10 reps    Heel Raises Limitations standing on foam pad    Rebounder wieght shifting 3 ways x 1 min each    Walking with Sports Cord 20# holding handles of pulleys: reverse x 10 reps- focus on heel to toe gait and control.    Gait Training tandem line walk 2x25 feet    Other Standing Knee Exercises single leg on Rt with reach to colored pods with Lt LE x 5 sets    Other Standing Knee Exercises tandem stance  with ball toss: Rt and Lt 2x10 tosses each position, modified single leg stance with ball toss 2x10 tosses      Vasopneumatic   Number Minutes Vasopneumatic  15 minutes    Vasopnuematic Location  Ankle    Vasopneumatic Pressure Medium      Ankle Exercises: Standing   Other Standing Ankle Exercises Df stretch on second step  20x   2 sets                   PT Short Term Goals - 01/20/21 1147      PT SHORT TERM GOAL #3   Title Pt will be able to demonstrate symmetrical gait mechanics and proper push off on the Rt during ambulation atleast 136ft, without or without AD, once cleared by her MD.    Status Achieved             PT Long Term Goals - 12/19/20 1526      PT LONG TERM GOAL #1   Title Pt will have atleast 10 deg of active ankle DF to allow for safe walking on even surfaces.    Time 8    Period  Weeks    Status New      PT LONG TERM GOAL #2   Title Pt will be able to maintain Rt single leg stance for atleast 5 sec, 2/3 trials, which will reflect improvements in ankle/foot proprioception.    Time 8    Period Weeks    Status New      PT LONG TERM GOAL #3   Title Pt will have greater than 45 deg of plantar flexion on the Rt to improve push off during ambulation.    Time 8    Period Weeks    Status New      PT LONG TERM GOAL #4   Title Pt will have improved gastroc strength, evident by her ability to complete 15 single leg heel raises on the Rt.    Time 8    Period Weeks    Status New      PT LONG TERM GOAL #5   Title Pt will be able to complete 5x sit to stand in less than 14 sec without the need for UE support and without significant weight shift off the Rt LE.    Time 8    Period Weeks    Status New                 Plan - 01/20/21 1153    Clinical Impression Statement Pt demonstrates improved gait pattern overall without device with symmetry with short distances. Pt arrives with chief complaint of ankle stiffness. Pt successfully performed a  combination of ROM and stretching exercises followed by weightbearing ankle strengthening and stabilization.  Was able to stabilize on Rt ankle with multidirectional reaching and perturbations.  Pt will continue to benefit from skilled PT to improve ankle strength, flexibility and proprioception/endurance s/p ORIF of Rt ankle.    PT Frequency 2x / week    PT Duration 8 weeks    PT Treatment/Interventions ADLs/Self Care Home Management;Aquatic Therapy;Cryotherapy;Stair training;Gait training;Functional mobility training;Therapeutic activities;Therapeutic exercise;Balance training;Neuromuscular re-education;Patient/family education;Dry needling;Scar mobilization;Manual techniques;Passive range of motion;Taping;Vasopneumatic Device    PT Next Visit Plan work on weightbearing progression, flexiblity and strength progression.  Proprioception and edema management    PT Home Exercise Plan [email or web address]    Recommended Other Services initial cert is signed    Consulted and Agree with Plan of Care Patient           Patient will benefit from skilled therapeutic intervention in order to improve the following deficits and impairments:  Abnormal gait,Decreased activity tolerance,Hypomobility,Decreased strength,Decreased skin integrity,Decreased range of motion,Pain,Decreased balance,Decreased mobility,Difficulty walking,Increased edema,Increased muscle spasms,Decreased scar mobility  Visit Diagnosis: Pain in right ankle and joints of right foot  Stiffness of right ankle, not elsewhere classified  Localized edema  Difficulty in walking, not elsewhere classified     Problem List There are no problems to display for this patient.   Lorrene Reid, PT 01/20/21 12:24 PM   [facility] [address], [address] [place], [place], [address] Phone: [phone]   Fax:  [phone]  Name: Natasha [name] MRN: [id number] Date of Birth: 08-28-1966

## 2021-01-22 ENCOUNTER — Other Ambulatory Visit: Payer: Self-pay

## 2021-01-22 ENCOUNTER — Ambulatory Visit: Payer: 59

## 2021-01-22 DIAGNOSIS — R262 Difficulty in walking, not elsewhere classified: Secondary | ICD-10-CM | POA: Diagnosis not present

## 2021-01-22 DIAGNOSIS — M25571 Pain in right ankle and joints of right foot: Secondary | ICD-10-CM

## 2021-01-22 DIAGNOSIS — M25671 Stiffness of right ankle, not elsewhere classified: Secondary | ICD-10-CM | POA: Diagnosis not present

## 2021-01-22 DIAGNOSIS — R600 Localized edema: Secondary | ICD-10-CM | POA: Diagnosis not present

## 2021-01-22 NOTE — Therapy (Signed)
[facility] [address] [place], [place], [address] Phone: [phone]   Fax:  [phone]  Physical Therapy Treatment  Patient Details  Name: Natasha [name] MRN: [id number] Date of Birth: 09-07-1966 Referring Provider (PT): Macie Burows, MD   Encounter Date: 01/22/2021   PT End of Session - 01/22/21 1216    Visit Number 8    Date for PT Re-Evaluation 02/13/21    Authorization Type [employer]    PT Start Time 1148    PT Stop Time 1240    PT Time Calculation (min) 52 min    Activity Tolerance Patient tolerated treatment well;No increased pain    Behavior During Therapy [facility] for tasks assessed/performed           History reviewed. No pertinent past medical history.  History reviewed. No pertinent surgical history.  There were no vitals filed for this visit.   Subjective Assessment - 01/22/21 1150    Subjective Walking more and I took the stairs.  I was sore after that.    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 No/denies                             [facility] Adult PT Treatment/Exercise - 01/22/21 0001      Knee/Hip Exercises: Aerobic   Nustep Level 2x 10 minutes-PT present to discuss progress      Knee/Hip Exercises: Standing   Heel Raises Both;2 sets;10 reps    Heel Raises Limitations standing on foam pad    Rocker Board 3 minutes    SLS on foam pad: Rt 5x10 seconds    Rebounder weight shifting 3 ways x 1 min each    Walking with Sports Cord 20# holding handles of pulleys: reverse x 10 reps- focus on heel to toe gait and control.    Gait Training tandem line walk 2x25 feet    Other Standing Knee Exercises single leg on Rt with reach to colored pods with Lt LE x 5 sets.  Tandem stance on foam pad 3x15 seconds in each position    Other Standing Knee Exercises tandem stance with ball toss: Rt and Lt 2x10 tosses each position,  modified single leg stance with ball toss 2x10 tosses- blue weighted toss      Vasopneumatic   Number Minutes Vasopneumatic  15 minutes    Vasopnuematic Location  Ankle    Vasopneumatic Pressure Medium    Vasopneumatic Temperature  3 flakes                    PT Short Term Goals - 01/20/21 1147      PT SHORT TERM GOAL #3   Title Pt will be able to demonstrate symmetrical gait mechanics and proper push off on the Rt during ambulation atleast 189ft, without or without AD, once cleared by her MD.    Status Achieved             PT Long Term Goals - 12/19/20 1526      PT LONG TERM GOAL #1   Title Pt will have atleast 10 deg of active ankle DF to allow for safe walking on even surfaces.    Time 8    Period Weeks    Status New      PT LONG TERM GOAL #2   Title Pt will be able  to maintain Rt single leg stance for atleast 5 sec, 2/3 trials, which will reflect improvements in ankle/foot proprioception.    Time 8    Period Weeks    Status New      PT LONG TERM GOAL #3   Title Pt will have greater than 45 deg of plantar flexion on the Rt to improve push off during ambulation.    Time 8    Period Weeks    Status New      PT LONG TERM GOAL #4   Title Pt will have improved gastroc strength, evident by her ability to complete 15 single leg heel raises on the Rt.    Time 8    Period Weeks    Status New      PT LONG TERM GOAL #5   Title Pt will be able to complete 5x sit to stand in less than 14 sec without the need for UE support and without significant weight shift off the Rt LE.    Time 8    Period Weeks    Status New                 Plan - 01/22/21 1209    Clinical Impression Statement Pt demonstrates improved gait pattern overall without device with symmetry with short distances. Pt successfully performed a combination of ROM and stretching exercises followed by weightbearing ankle strengthening and stabilization.  Was able to stabilize on Rt ankle with  multidirectional reaching and perturbations.  Pt was challenged by addition of foam pad for proprioception tasks.  Pt will continue to benefit from skilled PT to improve ankle strength, flexibility and proprioception/endurance s/p ORIF of Rt ankle.    PT Frequency 2x / week    PT Duration 8 weeks    PT Treatment/Interventions ADLs/Self Care Home Management;Aquatic Therapy;Cryotherapy;Stair training;Gait training;Functional mobility training;Therapeutic activities;Therapeutic exercise;Balance training;Neuromuscular re-education;Patient/family education;Dry needling;Scar mobilization;Manual techniques;Passive range of motion;Taping;Vasopneumatic Device    PT Next Visit Plan work on weightbearing progression, flexiblity and strength progression.  Proprioception and edema management    PT Home Exercise Plan [id number]    Consulted and Agree with Plan of Care Patient           Patient will benefit from skilled therapeutic intervention in order to improve the following deficits and impairments:  Abnormal gait,Decreased activity tolerance,Hypomobility,Decreased strength,Decreased skin integrity,Decreased range of motion,Pain,Decreased balance,Decreased mobility,Difficulty walking,Increased edema,Increased muscle spasms,Decreased scar mobility  Visit Diagnosis: Stiffness of right ankle, not elsewhere classified  Pain in right ankle and joints of right foot  Localized edema  Difficulty in walking, not elsewhere classified     Problem List There are no problems to display for this patient.    Lorrene Reid, PT 01/22/21 12:17 PM  [facility] [address] [place], [place], [address] Phone: [phone]   Fax:  [phone]  Name: Natasha [name] MRN: [id number] Date of Birth: 1966-07-16

## 2021-01-23 ENCOUNTER — Other Ambulatory Visit (HOSPITAL_COMMUNITY): Payer: Self-pay | Admitting: Internal Medicine

## 2021-01-23 ENCOUNTER — Other Ambulatory Visit: Payer: Self-pay | Admitting: Nurse Practitioner

## 2021-01-23 ENCOUNTER — Ambulatory Visit (INDEPENDENT_AMBULATORY_CARE_PROVIDER_SITE_OTHER): Payer: 59 | Admitting: Nurse Practitioner

## 2021-01-23 ENCOUNTER — Encounter: Payer: Self-pay | Admitting: Nurse Practitioner

## 2021-01-23 VITALS — BP 140/92 | HR 78 | Temp 97.8°F | Ht 67.0 in | Wt 343.0 lb

## 2021-01-23 DIAGNOSIS — Z8781 Personal history of (healed) traumatic fracture: Secondary | ICD-10-CM

## 2021-01-23 DIAGNOSIS — Z7689 Persons encountering health services in other specified circumstances: Secondary | ICD-10-CM | POA: Diagnosis not present

## 2021-01-23 DIAGNOSIS — Z566 Other physical and mental strain related to work: Secondary | ICD-10-CM

## 2021-01-23 DIAGNOSIS — G4709 Other insomnia: Secondary | ICD-10-CM | POA: Diagnosis not present

## 2021-01-23 DIAGNOSIS — Z1159 Encounter for screening for other viral diseases: Secondary | ICD-10-CM | POA: Diagnosis not present

## 2021-01-23 DIAGNOSIS — F1020 Alcohol dependence, uncomplicated: Secondary | ICD-10-CM | POA: Diagnosis not present

## 2021-01-23 DIAGNOSIS — Z13228 Encounter for screening for other metabolic disorders: Secondary | ICD-10-CM | POA: Diagnosis not present

## 2021-01-23 DIAGNOSIS — Z6841 Body Mass Index (BMI) 40.0 and over, adult: Secondary | ICD-10-CM

## 2021-01-23 DIAGNOSIS — I158 Other secondary hypertension: Secondary | ICD-10-CM | POA: Diagnosis not present

## 2021-01-23 DIAGNOSIS — R0683 Snoring: Secondary | ICD-10-CM | POA: Diagnosis not present

## 2021-01-23 MED ORDER — ESZOPICLONE 2 MG PO TABS: 2.0000 mg | ORAL_TABLET | Freq: Every day | ORAL | 2 refills | Status: DC

## 2021-01-23 MED FILL — ESZOPICLONE 2 MG TAB: 2 | 30 days supply | Qty: 30 | Fill #0

## 2021-01-23 MED FILL — SHINGRIX 50 MCG SUS: 50 | 1 days supply | Qty: 1 | Fill #0
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
## 2021-01-23 NOTE — Patient Instructions (Addendum)
Insomnia Insomnia is a sleep disorder that makes it difficult to fall asleep or stay asleep. Insomnia can cause fatigue, low energy, difficulty concentrating, mood swings, and poor performance at work or school. There are three different ways to classify insomnia:  Difficulty falling asleep.  Difficulty staying asleep.  Waking up too early in the morning. Any type of insomnia can be long-term (chronic) or short-term (acute). Both are common. Short-term insomnia usually lasts for three months or less. Chronic insomnia occurs at least three times a week for longer than three months. What are the causes? Insomnia may be caused by another condition, situation, or substance, such as:  Anxiety.  Certain medicines.  Gastroesophageal reflux disease (GERD) or other gastrointestinal conditions.  Asthma or other breathing conditions.  Restless legs syndrome, sleep apnea, or other sleep disorders.  Chronic pain.  Menopause.  Stroke.  Abuse of alcohol, tobacco, or illegal drugs.  Mental health conditions, such as depression.  Caffeine.  Neurological disorders, such as Alzheimer's disease.  An overactive thyroid (hyperthyroidism). Sometimes, the cause of insomnia may not be known. What increases the risk? Risk factors for insomnia include:  Gender. Women are affected more often than men.  Age. Insomnia is more common as you get older.  Stress.  Lack of exercise.  Irregular work schedule or working night shifts.  Traveling between different time zones.  Certain medical and mental health conditions. What are the signs or symptoms? If you have insomnia, the main symptom is having trouble falling asleep or having trouble staying asleep. This may lead to other symptoms, such as:  Feeling fatigued or having low energy.  Feeling nervous about going to sleep.  Not feeling rested in the morning.  Having trouble concentrating.  Feeling irritable, anxious, or depressed. How  is this diagnosed? This condition may be diagnosed based on:  Your symptoms and medical history. Your health care provider may ask about: ? Your sleep habits. ? Any medical conditions you have. ? Your mental health.  A physical exam. How is this treated? Treatment for insomnia depends on the cause. Treatment may focus on treating an underlying condition that is causing insomnia. Treatment may also include:  Medicines to help you sleep.  Counseling or therapy.  Lifestyle adjustments to help you sleep better. Follow these instructions at home: Eating and drinking  Limit or avoid alcohol, caffeinated beverages, and cigarettes, especially close to bedtime. These can disrupt your sleep.  Do not eat a large meal or eat spicy foods right before bedtime. This can lead to digestive discomfort that can make it hard for you to sleep.   Sleep habits  Keep a sleep diary to help you and your health care provider figure out what could be causing your insomnia. Write down: ? When you sleep. ? When you wake up during the night. ? How well you sleep. ? How rested you feel the next day. ? Any side effects of medicines you are taking. ? What you eat and drink.  Make your bedroom a dark, comfortable place where it is easy to fall asleep. ? Put up shades or blackout curtains to block light from outside. ? Use a white noise machine to block noise. ? Keep the temperature cool.  Limit screen use before bedtime. This includes: ? Watching TV. ? Using your smartphone, tablet, or computer.  Stick to a routine that includes going to bed and waking up at the same times every day and night. This can help you fall asleep faster. Consider   making a quiet activity, such as reading, part of your nighttime routine.  Try to avoid taking naps during the day so that you sleep better at night.  Get out of bed if you are still awake after 15 minutes of trying to sleep. Keep the lights down, but try reading or  doing a quiet activity. When you feel sleepy, go back to bed.   General instructions  Take over-the-counter and prescription medicines only as told by your health care provider.  Exercise regularly, as told by your health care provider. Avoid exercise starting several hours before bedtime.  Use relaxation techniques to manage stress. Ask your health care provider to suggest some techniques that may work well for you. These may include: ? Breathing exercises. ? Routines to release muscle tension. ? Visualizing peaceful scenes.  Make sure that you drive carefully. Avoid driving if you feel very sleepy.  Keep all follow-up visits as told by your health care provider. This is important. Contact a health care provider if:  You are tired throughout the day.  You have trouble in your daily routine due to sleepiness.  You continue to have sleep problems, or your sleep problems get worse. Get help right away if:  You have serious thoughts about hurting yourself or someone else. If you ever feel like you may hurt yourself or others, or have thoughts about taking your own life, get help right away. You can go to your nearest emergency department or call:  Your local emergency services (911 in the [place]  A suicide crisis helpline, such as the National Suicide Prevention Lifeline at (419)435-0248. This is open 24 hours a day. Summary  Insomnia is a sleep disorder that makes it difficult to fall asleep or stay asleep.  Insomnia can be long-term (chronic) or short-term (acute).  Treatment for insomnia depends on the cause. Treatment may focus on treating an underlying condition that is causing insomnia.  Keep a sleep diary to help you and your health care provider figure out what could be causing your insomnia. This information is not intended to replace advice given to you by your health care provider. Make sure you discuss any questions you have with your health care provider. Document  Revised: 10/03/2020 Document Reviewed: 10/03/2020 Elsevier Patient Education  2021 [employer]    We will be checking her thyroid studies and insulin levels  Will be ordering a sleep study allow 7-10 business days to process  The [facility] will call you for the bone density  Let's start with your sleep issues and work through to see if we can help with weight loss  Be sure to take the Lunesta at least 8 hours before awakening  Call if any issues or concerns

## 2021-01-24 DIAGNOSIS — Z1159 Encounter for screening for other viral diseases: Secondary | ICD-10-CM | POA: Diagnosis not present

## 2021-01-24 DIAGNOSIS — Z8781 Personal history of (healed) traumatic fracture: Secondary | ICD-10-CM | POA: Diagnosis not present

## 2021-01-24 DIAGNOSIS — Z13228 Encounter for screening for other metabolic disorders: Secondary | ICD-10-CM | POA: Diagnosis not present

## 2021-01-24 DIAGNOSIS — Z6841 Body Mass Index (BMI) 40.0 and over, adult: Secondary | ICD-10-CM | POA: Diagnosis not present

## 2021-01-25 LAB — CMP14+EGFR
ALT: 17 IU/L (ref 0–32)
AST: 17 IU/L (ref 0–40)
Albumin/Globulin Ratio: 1.4 (ref 1.2–2.2)
Albumin: 4.3 g/dL (ref 3.8–4.9)
Alkaline Phosphatase: 108 IU/L (ref 44–121)
BUN/Creatinine Ratio: 18 (ref 9–23)
BUN: 15 mg/dL (ref 6–24)
Bilirubin Total: 0.6 mg/dL (ref 0.0–1.2)
CO2: 23 mmol/L (ref 20–29)
Calcium: 9.8 mg/dL (ref 8.7–10.2)
Chloride: 97 mmol/L (ref 96–106)
Creatinine, Ser: 0.84 mg/dL (ref 0.57–1.00)
GFR calc Af Amer: 92 mL/min/{1.73_m2} (ref >59)
GFR calc non Af Amer: 80 mL/min/{1.73_m2} (ref >59)
Globulin, Total: 3.1 g/dL (ref 1.5–4.5)
Glucose: 98 mg/dL (ref 65–99)
Potassium: 4.1 mmol/L (ref 3.5–5.2)
Sodium: 137 mmol/L (ref 134–144)
Total Protein: 7.4 g/dL (ref 6.0–8.5)

## 2021-01-25 LAB — INSULIN, RANDOM: INSULIN: 5.1 u[IU]/mL (ref 2.6–24.9)

## 2021-01-25 LAB — HEPATITIS C ANTIBODY: Hep C Virus Ab: <0.1 s/co ratio (ref 0.0–0.9)

## 2021-01-25 LAB — VITAMIN D 25 HYDROXY (VIT D DEFICIENCY, FRACTURES): Vit D, 25-Hydroxy: 29.5 ng/mL — ABNORMAL LOW (ref 30.0–100.0)

## 2021-01-25 LAB — HEMOGLOBIN A1C
Est. average glucose Bld gHb Est-mCnc: 126 mg/dL
Hgb A1c MFr Bld: 6 % — ABNORMAL HIGH (ref 4.8–5.6)

## 2021-01-25 LAB — TSH: TSH: 1.69 u[IU]/mL (ref 0.450–4.500)

## 2021-01-25 LAB — THYROID ANTIBODIES
Thyroglobulin Antibody: <1 IU/mL (ref 0.0–0.9)
Thyroperoxidase Ab SerPl-aCnc: <8 IU/mL (ref 0–34)

## 2021-01-27 ENCOUNTER — Other Ambulatory Visit: Payer: Self-pay

## 2021-01-27 ENCOUNTER — Encounter: Payer: Self-pay | Admitting: Physical Therapy

## 2021-01-27 ENCOUNTER — Ambulatory Visit: Payer: 59 | Admitting: Physical Therapy

## 2021-01-27 DIAGNOSIS — M25671 Stiffness of right ankle, not elsewhere classified: Secondary | ICD-10-CM

## 2021-01-27 DIAGNOSIS — R600 Localized edema: Secondary | ICD-10-CM

## 2021-01-27 DIAGNOSIS — M25571 Pain in right ankle and joints of right foot: Secondary | ICD-10-CM | POA: Diagnosis not present

## 2021-01-27 DIAGNOSIS — R262 Difficulty in walking, not elsewhere classified: Secondary | ICD-10-CM | POA: Diagnosis not present

## 2021-01-27 NOTE — Therapy (Signed)
[facility] [address], [address] [place], [place], [address] Phone: [phone]   Fax:  [phone]  Physical Therapy Treatment  Patient Details  Name: Natasha [name] MRN: [id number] Date of Birth: Feb 13, 1966 Referring Provider (PT): Macie Burows, MD   Encounter Date: 01/27/2021   PT End of Session - 01/27/21 1011    Visit Number 9    Date for PT Re-Evaluation 02/13/21    Authorization Type [employer]    Authorization Time Period 12/19/20 to 02/13/21    PT Start Time 1012    PT Stop Time 1105    PT Time Calculation (min) 53 min    Activity Tolerance Patient tolerated treatment well;No increased pain    Behavior During Therapy WFL for tasks assessed/performed           Past Medical History:  Diagnosis Date  . Hypertension     Past Surgical History:  Procedure Laterality Date  . GASTRIC BYPASS      There were no vitals filed for this visit.   Subjective Assessment - 01/27/21 1012    Subjective I walk bettter when I take an Advil in the morning.    Currently in Pain? No/denies   just stiff   Multiple Pain Sites No                             [facility] Adult PT Treatment/Exercise - 01/27/21 0001      Knee/Hip Exercises: Standing   Heel Raises Limitations standing on mini tramp 2x10    Forward Step Up Right;1 set;10 reps;Hand Hold: 2;Step Height: 6"    Step Down Left;10 reps;Hand Hold: 2;Step Height: 6";2 sets    Step Down Limitations Vc for alignment of foot/hip    SLS with toe taps to colored pods 10x total passes    Rebounder weight shifting 3 ways x 1 min each    Walking with Sports Cord 20# holding handles of pulleys: reverse x 10 reps- focus on heel to toe gait and control.    Other Standing Knee Exercises SLS RT, LT on floor slider; 10x eaach direction      Vasopneumatic   Number Minutes Vasopneumatic  15 minutes    Vasopnuematic Location  Ankle    Vasopneumatic Pressure  Medium    Vasopneumatic Temperature  3 flakes      Ankle Exercises: Seated   Other Seated Ankle Exercises Rocker board warm up 5 min ankle DF/PF                    PT Short Term Goals - 01/20/21 1147      PT SHORT TERM GOAL #3   Title Pt will be able to demonstrate symmetrical gait mechanics and proper push off on the Rt during ambulation atleast 125ft, without or without AD, once cleared by her MD.    Status Achieved             PT Long Term Goals - 12/19/20 1526      PT LONG TERM GOAL #1   Title Pt will have atleast 10 deg of active ankle DF to allow for safe walking on even surfaces.    Time 8    Period Weeks    Status New      PT LONG TERM GOAL #2   Title Pt will be able to maintain Rt single leg stance for atleast 5 sec, 2/3 trials,  which will reflect improvements in ankle/foot proprioception.    Time 8    Period Weeks    Status New      PT LONG TERM GOAL #3   Title Pt will have greater than 45 deg of plantar flexion on the Rt to improve push off during ambulation.    Time 8    Period Weeks    Status New      PT LONG TERM GOAL #4   Title Pt will have improved gastroc strength, evident by her ability to complete 15 single leg heel raises on the Rt.    Time 8    Period Weeks    Status New      PT LONG TERM GOAL #5   Title Pt will be able to complete 5x sit to stand in less than 14 sec without the need for UE support and without significant weight shift off the Rt LE.    Time 8    Period Weeks    Status New                 Plan - 01/27/21 1011    Clinical Impression Statement Pt continues to do well improving her symmetry when walking. pt demonstrated no significant compensations with steps downs or any other weightbearing exercise today, also pain free. Edema continues in ankle but improving. pt notices when she takes 2 Advil in the AM hersore/stiff morning ankle almost abolishes.    Personal Factors and Comorbidities Fitness;Past/Current  Experience    Examination-Activity Limitations Stairs    Stability/Clinical Decision Making Stable/Uncomplicated    Rehab Potential Excellent    PT Frequency 2x / week    PT Duration 8 weeks    PT Treatment/Interventions ADLs/Self Care Home Management;Aquatic Therapy;Cryotherapy;Stair training;Gait training;Functional mobility training;Therapeutic activities;Therapeutic exercise;Balance training;Neuromuscular re-education;Patient/family education;Dry needling;Scar mobilization;Manual techniques;Passive range of motion;Taping;Vasopneumatic Device    PT Next Visit Plan work on weightbearing progression, flexiblity and strength progression.  Proprioception and edema management. pt out of town x1 week.    PT Home Exercise Plan [email or web address]    Consulted and Agree with Plan of Care Patient           Patient will benefit from skilled therapeutic intervention in order to improve the following deficits and impairments:  Abnormal gait,Decreased activity tolerance,Hypomobility,Decreased strength,Decreased skin integrity,Decreased range of motion,Pain,Decreased balance,Decreased mobility,Difficulty walking,Increased edema,Increased muscle spasms,Decreased scar mobility  Visit Diagnosis: Stiffness of right ankle, not elsewhere classified  Pain in right ankle and joints of right foot  Difficulty in walking, not elsewhere classified  Localized edema     Problem List There are no problems to display for this patient.   Wylder Macomber, PTA 01/27/2021, 10:53 AM  [facility] [address], [address] [place], [place], [address] Phone: [phone]   Fax:  [phone]  Name: Natasha [name] MRN: [id number] Date of Birth: Jul 07, 1966

## 2021-01-29 ENCOUNTER — Other Ambulatory Visit: Payer: Self-pay | Admitting: Nurse Practitioner

## 2021-01-29 ENCOUNTER — Encounter: Payer: 59 | Admitting: Physical Therapy

## 2021-01-29 ENCOUNTER — Other Ambulatory Visit (HOSPITAL_COMMUNITY): Payer: Self-pay | Admitting: Nurse Practitioner

## 2021-01-29 MED ORDER — METFORMIN HCL 500 MG PO TABS
500.0000 mg | ORAL_TABLET | Freq: Two times a day (BID) | ORAL | 2 refills | Status: DC
Start: 2021-01-29 — End: 2021-07-01

## 2021-01-30 MED FILL — METFORMIN HCL 500 MG TABS: 500 | 30 days supply | Qty: 60 | Fill #0

## 2021-01-31 MED FILL — VIT D2 1.25 MG (50,000 UNIT: 1.25 MG | 28 days supply | Qty: 4 | Fill #0

## 2021-02-03 ENCOUNTER — Other Ambulatory Visit: Payer: Self-pay

## 2021-02-03 ENCOUNTER — Ambulatory Visit: Payer: 59

## 2021-02-03 DIAGNOSIS — M25571 Pain in right ankle and joints of right foot: Secondary | ICD-10-CM | POA: Diagnosis not present

## 2021-02-03 DIAGNOSIS — M25671 Stiffness of right ankle, not elsewhere classified: Secondary | ICD-10-CM

## 2021-02-03 DIAGNOSIS — R262 Difficulty in walking, not elsewhere classified: Secondary | ICD-10-CM

## 2021-02-03 DIAGNOSIS — R600 Localized edema: Secondary | ICD-10-CM | POA: Diagnosis not present

## 2021-02-03 NOTE — Therapy (Signed)
[facility] [address], [address] [place], [place], [address] Phone: [phone]   Fax:  [phone]  Physical Therapy Treatment  Patient Details  Name: Natasha [name] MRN: [id number] Date of Birth: March 17, 1966 Referring Provider (PT): Macie Burows, MD   Encounter Date: 02/03/2021   PT End of Session - 02/03/21 1225    Visit Number 10    Date for PT Re-Evaluation 02/13/21    Authorization Type [employer]    Authorization Time Period 12/19/20 to 02/13/21    PT Start Time 1147    PT Stop Time 1225    PT Time Calculation (min) 38 min    Activity Tolerance Patient tolerated treatment well;No increased pain    Behavior During Therapy WFL for tasks assessed/performed           Past Medical History:  Diagnosis Date  . Hypertension     Past Surgical History:  Procedure Laterality Date  . GASTRIC BYPASS      There were no vitals filed for this visit.   Subjective Assessment - 02/03/21 1151    Subjective I have stiffness in the morning when I get up.    Currently in Pain? No/denies                             [facility] Adult PT Treatment/Exercise - 02/03/21 0001      Knee/Hip Exercises: Standing   Heel Raises Both;2 sets;10 reps    Forward Step Up Right;1 set;10 reps;Hand Hold: 2;Step Height: 8"    Rocker Board 3 minutes    Walking with Sports Cord 20# holding handles of pulleys: reverse x 10 reps- focus on heel to toe gait and control.    Other Standing Knee Exercises tandem stance on foam pad: Rt and Lt 2x30 seconds each    Other Standing Knee Exercises SLS Rt, Lt on floor slider; 3x6 each direction      Ankle Exercises: Seated   BAPS Sitting;Level 3   4 ways x 1 min each                   PT Short Term Goals - 01/20/21 1147      PT SHORT TERM GOAL #3   Title Pt will be able to demonstrate symmetrical gait mechanics and proper push off on the Rt during ambulation atleast  170ft, without or without AD, once cleared by her MD.    Status Achieved             PT Long Term Goals - 12/19/20 1526      PT LONG TERM GOAL #1   Title Pt will have atleast 10 deg of active ankle DF to allow for safe walking on even surfaces.    Time 8    Period Weeks    Status New      PT LONG TERM GOAL #2   Title Pt will be able to maintain Rt single leg stance for atleast 5 sec, 2/3 trials, which will reflect improvements in ankle/foot proprioception.    Time 8    Period Weeks    Status New      PT LONG TERM GOAL #3   Title Pt will have greater than 45 deg of plantar flexion on the Rt to improve push off during ambulation.    Time 8    Period Weeks    Status New  PT LONG TERM GOAL #4   Title Pt will have improved gastroc strength, evident by her ability to complete 15 single leg heel raises on the Rt.    Time 8    Period Weeks    Status New      PT LONG TERM GOAL #5   Title Pt will be able to complete 5x sit to stand in less than 14 sec without the need for UE support and without significant weight shift off the Rt LE.    Time 8    Period Weeks    Status New                 Plan - 02/03/21 1224    Clinical Impression Statement Pt continues to experience ankle stiffness particularly in the morning consistent with s/p ORIF of the ankle.  Pt was challenged by the increase in step height with step-up exercise.  Pt requires verbal cues for knee flexion on Rt with slider activity to improve eccentric control of the Rt LE.  Pt demonstrates good symmetry with ambulation on level surface.  Pt with good proprioceptive control in standing on foam pad today and self corrects for ankle alignment and equal weightbearing. Pt has vasopneumatic device at home and declined [employer] today.  Pt will continue to benefit from skilled PT to address Rt ankle strength, stability and flexibility.    PT Frequency 2x / week    PT Duration 8 weeks    PT Treatment/Interventions  ADLs/Self Care Home Management;Aquatic Therapy;Cryotherapy;Stair training;Gait training;Functional mobility training;Therapeutic activities;Therapeutic exercise;Balance training;Neuromuscular re-education;Patient/family education;Dry needling;Scar mobilization;Manual techniques;Passive range of motion;Taping;Vasopneumatic Device    PT Next Visit Plan work on weightbearing progression, flexiblity and strength progression.  Proprioception and edema management.    PT Home Exercise Plan [email or web address]    Consulted and Agree with Plan of Care Patient           Patient will benefit from skilled therapeutic intervention in order to improve the following deficits and impairments:  Abnormal gait,Decreased activity tolerance,Hypomobility,Decreased strength,Decreased skin integrity,Decreased range of motion,Pain,Decreased balance,Decreased mobility,Difficulty walking,Increased edema,Increased muscle spasms,Decreased scar mobility  Visit Diagnosis: Pain in right ankle and joints of right foot  Stiffness of right ankle, not elsewhere classified  Difficulty in walking, not elsewhere classified  Localized edema     Problem List There are no problems to display for this patient.    Lorrene Reid, PT 02/03/21 12:27 PM  [facility] [address], [address] [place], [place], [address] Phone: [phone]   Fax:  [phone]  Name: Natasha [name] MRN: [id number] Date of Birth: 29-Oct-1966

## 2021-02-04 ENCOUNTER — Encounter: Payer: Self-pay | Admitting: Nurse Practitioner

## 2021-02-05 ENCOUNTER — Other Ambulatory Visit: Payer: Self-pay

## 2021-02-05 ENCOUNTER — Encounter: Payer: Self-pay | Admitting: Physical Therapy

## 2021-02-05 ENCOUNTER — Ambulatory Visit: Payer: 59 | Attending: Foot and Ankle Surgery | Admitting: Physical Therapy

## 2021-02-05 DIAGNOSIS — M25671 Stiffness of right ankle, not elsewhere classified: Secondary | ICD-10-CM | POA: Diagnosis not present

## 2021-02-05 DIAGNOSIS — R262 Difficulty in walking, not elsewhere classified: Secondary | ICD-10-CM | POA: Insufficient documentation

## 2021-02-05 DIAGNOSIS — R600 Localized edema: Secondary | ICD-10-CM | POA: Diagnosis not present

## 2021-02-05 DIAGNOSIS — M25571 Pain in right ankle and joints of right foot: Secondary | ICD-10-CM | POA: Insufficient documentation

## 2021-02-05 NOTE — Patient Instructions (Signed)
Access Code: [email or web address] URL: [email or web address] Date: 02/05/2021 Prepared by: Raynelle Fanning  Exercises Long Sitting Calf Stretch with Strap - 2-3 x daily - 7 x weekly - 2 sets - 30 seconds hold Seated Ankle Plantarflexion Dorsiflexion PROM - 2-3 x daily - 7 x weekly - 2 sets - 30 second hold Seated Self Great Toe Stretch - 2-3 x daily - 7 x weekly - 2 sets - 30 second hold Ankle and Toe Plantarflexion with Resistance - 1-2 x daily - 7 x weekly - 1 sets - 20 reps Seated Ankle Circles - 1 x daily - 7 x weekly - 3 sets - 10 reps Side to Side Weight Shift with Counter Support - 2 x daily - 7 x weekly - 2 sets - 10 reps Staggered Stance Forward Backward Weight Shift with Counter Support - 2 x daily - 7 x weekly - 2 sets - 10 reps Heel rises with counter support - 1 x daily - 7 x weekly - 2-3 sets - 5 reps Gastroc Stretch on Wall - 2 x daily - 7 x weekly - 1 sets - 3 reps - 20 hold Eccentric Heel Lowering on Step - 1 x daily - 7 x weekly - 3 sets - 10 reps  Patient Education Trigger Point Dry Needling

## 2021-02-05 NOTE — Therapy (Signed)
[facility] [address], [address] [place], [place], [address] Phone: [phone]   Fax:  [phone]  Physical Therapy Treatment  Patient Details  Name: Natasha [name] MRN: [id number] Date of Birth: 06/12/1966 Referring Provider (PT): Macie Burows, MD   Encounter Date: 02/05/2021   PT End of Session - 02/05/21 1148    Visit Number 11    Date for PT Re-Evaluation 02/13/21    Authorization Type [employer]    Authorization Time Period 12/19/20 to 02/13/21    PT Start Time 1148    PT Stop Time 1227    PT Time Calculation (min) 39 min    Activity Tolerance Patient tolerated treatment well;No increased pain    Behavior During Therapy WFL for tasks assessed/performed           Past Medical History:  Diagnosis Date  . Hypertension     Past Surgical History:  Procedure Laterality Date  . GASTRIC BYPASS      There were no vitals filed for this visit.   Subjective Assessment - 02/05/21 1148    Subjective I have been having some achiles pain in the right.    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 Yes    Pain Score 4     Pain Location Ankle    Pain Orientation Right    Pain Descriptors / Indicators Sharp    Pain Type Acute pain    Pain Frequency Intermittent    Aggravating Factors  getting up from floor, heel raises                             [facility] Adult PT Treatment/Exercise - 02/05/21 0001      Manual Therapy   Manual Therapy Soft tissue mobilization;Passive ROM;Myofascial release    Manual therapy comments Skilled palpation and monitoring of soft tissues during DN    Soft tissue mobilization to right gastroc/soleus    Myofascial Release transfriction massge to right achilles tendon      Ankle Exercises: Standing   Heel Raises Both;Limitations    Heel Raises Limitations 8 reps with eccentric lowering; 4 reps just right ankle with  eccentric lowering first but started to hurt      Ankle Exercises: Stretches   Plantar Fascia Stretch 1 rep;20 seconds    Slant Board Stretch 3 reps;60 seconds    Other Stretch ant tib stretch seated and with leg extended and pointing toes med/straight/lat x 5 reps    Other Stretch foot on 1st step and lunging for ankle DF x 10      Ankle Exercises: Seated   Other Seated Ankle Exercises 4 way tband blue and black x 5 ea; black band issued for HEP            Trigger Point Dry Needling - 02/05/21 0001    Consent Given? Yes    Education Handout Provided Yes    Muscles Treated Lower Quadrant Gastrocnemius;Soleus    Dry Needling Comments right medial    Gastrocnemius Response Palpable increased muscle length    Soleus Response Palpable increased muscle length                PT Education - 02/05/21 1740    Education Details HEP; DN education and aftercare    Person(s) Educated Patient    Methods Explanation;Demonstration;Handout    Comprehension Verbalized understanding;Returned  demonstration            PT Short Term Goals - 01/20/21 1147      PT SHORT TERM GOAL #3   Title Pt will be able to demonstrate symmetrical gait mechanics and proper push off on the Rt during ambulation atleast 167ft, without or without AD, once cleared by her MD.    Status Achieved             PT Long Term Goals - 12/19/20 1526      PT LONG TERM GOAL #1   Title Pt will have atleast 10 deg of active ankle DF to allow for safe walking on even surfaces.    Time 8    Period Weeks    Status New      PT LONG TERM GOAL #2   Title Pt will be able to maintain Rt single leg stance for atleast 5 sec, 2/3 trials, which will reflect improvements in ankle/foot proprioception.    Time 8    Period Weeks    Status New      PT LONG TERM GOAL #3   Title Pt will have greater than 45 deg of plantar flexion on the Rt to improve push off during ambulation.    Time 8    Period Weeks    Status New       PT LONG TERM GOAL #4   Title Pt will have improved gastroc strength, evident by her ability to complete 15 single leg heel raises on the Rt.    Time 8    Period Weeks    Status New      PT LONG TERM GOAL #5   Title Pt will be able to complete 5x sit to stand in less than 14 sec without the need for UE support and without significant weight shift off the Rt LE.    Time 8    Period Weeks    Status New                 Plan - 02/05/21 1740    Clinical Impression Statement Patient presents today with c/o achilles tendon pain which happens when getting off of floor and with heel raises. She is point tender at distal achilles and in medial soleus and gastroc. We worked on eccentric heel raises on the step. She was able to do unilaterally, but pain started to increase, so bil heel raise with eccentric lowering was issued. Additionally, an initial trial of DN was done to her right med soleus and gastroc with good results. Patient requested stronger band for ankle HEP so black was issued. She is still having pain with weighted DF on the right.    PT Treatment/Interventions ADLs/Self Care Home Management;Aquatic Therapy;Cryotherapy;Stair training;Gait training;Functional mobility training;Therapeutic activities;Therapeutic exercise;Balance training;Neuromuscular re-education;Patient/family education;Dry needling;Scar mobilization;Manual techniques;Passive range of motion;Taping;Vasopneumatic Device    PT Next Visit Plan Assess DN and achilles pain, may benefit from ionto, but not on POC; work on weightbearing progression, flexiblity and strength progression.  Proprioception and edema management.    PT Home Exercise Plan [email or web address]           Patient will benefit from skilled therapeutic intervention in order to improve the following deficits and impairments:  Abnormal gait,Decreased activity tolerance,Hypomobility,Decreased strength,Decreased skin integrity,Decreased range of  motion,Pain,Decreased balance,Decreased mobility,Difficulty walking,Increased edema,Increased muscle spasms,Decreased scar mobility  Visit Diagnosis: Pain in right ankle and joints of right foot  Stiffness of right ankle, not elsewhere classified  Difficulty in walking,  not elsewhere classified     Problem List There are no problems to display for this patient.   Raynelle Fanning Rilei Kravitz PT 02/05/2021, 5:46 PM  [facility] [address] [place], [place], [address] Phone: [phone]   Fax:  [phone]  Name: Natasha [name] MRN: [id number] Date of Birth: 01-29-1966

## 2021-02-10 ENCOUNTER — Encounter: Payer: 59 | Admitting: Physical Therapy

## 2021-02-10 DIAGNOSIS — M7989 Other specified soft tissue disorders: Secondary | ICD-10-CM | POA: Diagnosis not present

## 2021-02-10 DIAGNOSIS — M7731 Calcaneal spur, right foot: Secondary | ICD-10-CM | POA: Diagnosis not present

## 2021-02-10 DIAGNOSIS — Z09 Encounter for follow-up examination after completed treatment for conditions other than malignant neoplasm: Secondary | ICD-10-CM | POA: Diagnosis not present

## 2021-02-12 ENCOUNTER — Other Ambulatory Visit: Payer: Self-pay

## 2021-02-12 ENCOUNTER — Ambulatory Visit: Payer: 59

## 2021-02-12 DIAGNOSIS — R600 Localized edema: Secondary | ICD-10-CM

## 2021-02-12 DIAGNOSIS — M25571 Pain in right ankle and joints of right foot: Secondary | ICD-10-CM

## 2021-02-12 DIAGNOSIS — R262 Difficulty in walking, not elsewhere classified: Secondary | ICD-10-CM | POA: Diagnosis not present

## 2021-02-12 DIAGNOSIS — M25671 Stiffness of right ankle, not elsewhere classified: Secondary | ICD-10-CM

## 2021-02-12 NOTE — Therapy (Addendum)
[facility] [address] [place], [place], [address] Phone: [phone]   Fax:  [phone]  Physical Therapy Treatment  Patient Details  Name: Natasha [name] MRN: [id number] Date of Birth: [birth year] Referring Provider (PT): Altamese Cabal, MD   Encounter Date: 02/12/2021   PT End of Session - 02/12/21 1228    Visit Number 12    Date for PT Re-Evaluation 02/13/21    Authorization Type [employer]    PT Start Time 9179    PT Stop Time 1225    PT Time Calculation (min) 34 min    Activity Tolerance Patient tolerated treatment well;No increased pain    Behavior During Therapy WFL for tasks assessed/performed           Past Medical History:  Diagnosis Date  . Hypertension     Past Surgical History:  Procedure Laterality Date  . GASTRIC BYPASS      There were no vitals filed for this visit.   Subjective Assessment - 02/12/21 1153    Subjective I saw the MD.  He extended my return to work 03/14/21.  I have been trying to get back to the things I like to do.    Currently in Pain? Yes    Pain Score --   1-7/10   Pain Location Ankle    Pain Orientation Right    Pain Type Surgical pain    Pain Onset More than a month ago    Pain Frequency Intermittent    Aggravating Factors  morning, getting up from the floor, change of weather    Pain Relieving Factors stretching, rest              [facility] PT Assessment - 02/12/21 0001      Assessment   Medical Diagnosis s/p Rt ankle ORIF    Referring Provider (PT) Altamese Cabal, MD    Onset Date/Surgical Date 11/15/20    Next MD Visit 3 months      AROM   Right Ankle Dorsiflexion 5    Right Ankle Plantar Flexion 40      Strength   Overall Strength Deficits    Overall Strength Comments ankle PF: 4/5 bilaterally, all other 5/5      Palpation   Palpation comment single leg stance: 5-7 seconds 3 trials      Ambulation/Gait   Ambulation/Gait Yes     Ambulation/Gait Assistance 6: Modified independent (Device/Increase time)    Gait Pattern Step-through pattern;Antalgic;Decreased dorsiflexion - right                                 PT Education - 02/12/21 1224    Education Details discussion of progression of exercises and endurance tasks to prepare for return to work    [employer]) Educated Patient    Methods Explanation;Demonstration    Comprehension Verbalized understanding;Returned demonstration            PT Short Term Goals - 01/20/21 1147      PT SHORT TERM GOAL #3   Title Pt will be able to demonstrate symmetrical gait mechanics and proper push off on the Rt during ambulation atleast 120f, without or without AD, once cleared by her MD.    Status Achieved             PT Long Term Goals - 02/12/21 1156      PT LONG TERM GOAL #  1   Title Pt will have atleast 10 deg of active ankle DF to allow for safe walking on even surfaces.    Status Not Met      PT LONG TERM GOAL #2   Title Pt will be able to maintain Rt single leg stance for atleast 5 sec, 2/3 trials, which will reflect improvements in ankle/foot proprioception.    Status Achieved      PT LONG TERM GOAL #3   Title Pt will have greater than 45 deg of plantar flexion on the Rt to improve push off during ambulation.    Baseline 40    Status Partially Met      PT LONG TERM GOAL #4   Title Pt will have improved gastroc strength, evident by her ability to complete 15 single leg heel raises on the Rt.    Baseline not able to do this on either leg    Status Deferred      PT LONG TERM GOAL #5   Title Pt will be able to complete 5x sit to stand in less than 14 sec without the need for UE support and without significant weight shift off the Rt LE.    Baseline 11.37    Status Achieved                 Plan - 02/12/21 1227    Clinical Impression Statement Pt will be out of work until 03/14/21 per MD while continuing to work on her  endurance, gait and functional strength. Pt has done very well post-op and has met most of her goals.  Gait is antalgic after periods of immobility when ankle is stiff and when fatigued.  Pt with improved 5x sit to stand time and SLS is improved to 5-7 seconds for 2/3 trials. Session focused on discussion of how to advance exercise and activity to prepare for return to work.  Pt will return if needed and if not, will be discharged to HEP after 03/14/21.    PT Frequency 2x / week    PT Duration 8 weeks    PT Treatment/Interventions ADLs/Self Care Home Management;Aquatic Therapy;Cryotherapy;Stair training;Gait training;Functional mobility training;Therapeutic activities;Therapeutic exercise;Balance training;Neuromuscular re-education;Patient/family education;Dry needling;Scar mobilization;Manual techniques;Passive range of motion;Taping;Vasopneumatic Device    PT Next Visit Plan ERO will be needed if pt returns.  If pt doesn't return by 03/14/21- D/C to HEP    PT Home Exercise Plan [id number]    Consulted and Agree with Plan of Care Patient           Patient will benefit from skilled therapeutic intervention in order to improve the following deficits and impairments:  Abnormal gait,Decreased activity tolerance,Hypomobility,Decreased strength,Decreased skin integrity,Decreased range of motion,Pain,Decreased balance,Decreased mobility,Difficulty walking,Increased edema,Increased muscle spasms,Decreased scar mobility  Visit Diagnosis: Pain in right ankle and joints of right foot  Stiffness of right ankle, not elsewhere classified  Difficulty in walking, not elsewhere classified  Localized edema     Problem List There are no problems to display for this patient.   Sigurd Sos, PT 02/12/21 12:29 PM PHYSICAL THERAPY DISCHARGE SUMMARY  Visits from Start of Care: 12  Current functional level related to goals / functional outcomes: See above for current PT status.  Pt was placed on hold and was  going back to work.     Remaining deficits: See above for current PT status.     Education / Equipment: HEP Plan: Patient agrees to discharge.  Patient goals were partially met. Patient is being discharged  due to the patient's request.  ?????        Sigurd Sos, PT 04/03/21 10:14 AM   [facility] [address], [place], [place], [address] Phone: [phone]   Fax:  [phone]  Name: TAMECA JEREZ MRN: [id number] Date of Birth: [birth year]

## 2021-02-25 ENCOUNTER — Institutional Professional Consult (permissible substitution): Payer: 59 | Admitting: Neurology

## 2021-02-26 ENCOUNTER — Ambulatory Visit: Payer: 59 | Admitting: Neurology

## 2021-02-26 ENCOUNTER — Encounter: Payer: Self-pay | Admitting: Neurology

## 2021-02-26 VITALS — BP 122/78 | HR 78 | Ht 67.5 in | Wt 339.0 lb

## 2021-02-26 DIAGNOSIS — R0683 Snoring: Secondary | ICD-10-CM | POA: Diagnosis not present

## 2021-02-26 DIAGNOSIS — R0681 Apnea, not elsewhere classified: Secondary | ICD-10-CM | POA: Diagnosis not present

## 2021-02-26 DIAGNOSIS — G4700 Insomnia, unspecified: Secondary | ICD-10-CM | POA: Diagnosis not present

## 2021-02-26 DIAGNOSIS — Z9189 Other specified personal risk factors, not elsewhere classified: Secondary | ICD-10-CM

## 2021-02-26 DIAGNOSIS — Z6841 Body Mass Index (BMI) 40.0 and over, adult: Secondary | ICD-10-CM | POA: Diagnosis not present

## 2021-02-26 DIAGNOSIS — G4719 Other hypersomnia: Secondary | ICD-10-CM | POA: Diagnosis not present

## 2021-02-26 NOTE — Patient Instructions (Signed)

## 2021-02-26 NOTE — Progress Notes (Signed)
Subjective:    Patient ID: Natasha [name] is a 55 y.o. female.  HPI     Huston Foley, MD, PhD [facility] [address] [address] [place], [place] [address]  Dear Lolita Cram,   I saw your patient, Natasha [name], upon your kind request in my sleep clinic today for initial consultation of her sleep disorder, in particular, concern for underlying obstructive sleep apnea in the context of difficulty sleeping.  The patient is unaccompanied today.  As you know, Natasha [name] is a 55 year old right-handed woman with an underlying medical history of hypertension, history of ankle fracture on the left and right, mildly low vitamin D, and morbid obesity with a BMI of over 50, status post weight loss surgery in 2004 with reversal in 2017, who reports snoring and witnessed issues breathing while asleep, as reported by her boyfriend who is a PA.  Patient reports that her snoring is worse when she sleeps on her back.  She typically sleeps on her sides.  She has no family history of sleep apnea.  She is not sure if her tonsils came out.  She had gastric bypass surgery but had a reversal several years later.  She has regained most of her weight.  She is working currently on weight loss and is counting her calories.  She works as an Charity fundraiser at [employer].  She moved from [place] a little over 1 year ago.  She is single, her boyfriend still lives and works in [place].  She is not sure if she will stay long-term.  She reports that her father had a diagnosis of narcolepsy.  She is currently not working because she had right ankle repair in December 2021.  Bedtime generally when she works is around 11 and rise time around 5:45 AM.  She has been on [place].  In the recent past, she admits to utilizing alcohol to help her sleep at night.  She has chronic difficulty going to sleep and staying asleep.  She denies using any alcohol currently.  She has been on Lunesta and takes it around 11 PM, 2 mg  strength.  It helps.  She went from getting an average of 3 hours of sleep to about 4 to 5 hours of sleep I reviewed your office note from 01/23/2021.  Her Epworth sleepiness score is 6 out of 24, fatigue severity score is 26 out of 63.  She denies night to night nocturia or recurrent morning headaches.  She does not drink caffeine daily, typically only when she has to go to work.  She has to be at work at 7.  She is a non-smoker and currently does not drink any alcohol.  Her Past Medical History Is Significant For: Past Medical History:  Diagnosis Date  . Hypertension     Her Past Surgical History Is Significant For: Past Surgical History:  Procedure Laterality Date  . GASTRIC BYPASS      Her Family History Is Significant For: Family History  Problem Relation Age of Onset  . Kidney disease Mother   . Diabetes Father     Her Social History Is Significant For: Social History   Socioeconomic History  . Marital status: Single    Spouse name: Not on file  . Number of children: Not on file  . Years of education: Not on file  . Highest education level: Not on file  Occupational History  . Not on file  Tobacco Use  . Smoking status: Never  Smoker  . Smokeless tobacco: Never Used  Substance and Sexual Activity  . Alcohol use: Yes  . Drug use: Never  . Sexual activity: Not on file  Other Topics Concern  . Not on file  Social History Narrative  . Not on file   Social Determinants of Health   Financial Resource Strain: Not on file  Food Insecurity: Not on file  Transportation Needs: Not on file  Physical Activity: Not on file  Stress: Not on file  Social Connections: Not on file    Her Allergies Are:  No Known Allergies:   Her Current Medications Are:  Outpatient Encounter Medications as of 02/26/2021  Medication Sig  . amLODipine (NORVASC) 5 MG tablet Take 5 mg by mouth daily.  . eszopiclone (LUNESTA) 2 MG TABS tablet Take 1 tablet (2 mg total) by mouth at bedtime.  Take immediately before bedtime  . metFORMIN (GLUCOPHAGE) 500 MG tablet Take 1 tablet (500 mg total) by mouth 2 (two) times daily with a meal.  . telmisartan-hydrochlorothiazide (MICARDIS HCT) 80-25 MG tablet Take 1 tablet by mouth daily.   No facility-administered encounter medications on file as of 02/26/2021.  :   Review of Systems:  Out of a complete 14 point review of systems, all are reviewed and negative with the exception of these symptoms as listed below: Review of Systems  Neurological:       Here for sleep consult. Reports she thinks she had a sleep study many years ago but was never started on CPAP.   Epworth Sleepiness Scale 0= would never doze 1= slight chance of dozing 2= moderate chance of dozing 3= high chance of dozing  Sitting and reading: Watching TV: Sitting inactive in a public place (ex. Theater or meeting): As a passenger in a car for an hour without a break: Lying down to rest in the afternoon: Sitting and talking to someone: Sitting quietly after lunch (no alcohol): In a car, while stopped in traffic: Total:     Objective:  Neurological Exam  Physical Exam Physical Examination:   Vitals:   02/26/21 0914  BP: 122/78  Pulse: 78  SpO2: 98%    General Examination: The patient is a very pleasant 55 y.o. female in no acute distress. She appears well-developed and well-nourished and well groomed.   HEENT: Normocephalic, atraumatic, pupils are equal, round and reactive to light, extraocular tracking is good without limitation to gaze excursion or nystagmus noted. Hearing is grossly intact. Face is symmetric with normal facial animation. Speech is clear with no dysarthria noted. There is no hypophonia. There is no lip, neck/head, jaw or voice tremor. Neck is supple with full range of passive and active motion. There are no carotid bruits on auscultation. Oropharynx exam reveals: mild mouth dryness, good dental hygiene, and mild airway crowding secondary  to small airway entry, tonsils could be absent or very small, Mallampati class III, neck circumference of 15 5/8 inches.  Tongue is on the longer side.  Tongue protrudes centrally and palate elevates symmetrically.  She has a mild to moderate overbite.   Chest: Clear to auscultation without wheezing, rhonchi or crackles noted.  Heart: S1+S2+0, regular and normal without murmurs, rubs or gallops noted.   Abdomen: Soft, non-tender and non-distended with normal bowel sounds appreciated on auscultation.  Extremities: There is no pitting edema in the distal lower extremities bilaterally.   Skin: Warm and dry without trophic changes noted.   Musculoskeletal: exam reveals healing surgical scar lateral right ankle.  Neurologically:  Mental status: The patient is awake, alert and oriented in all 4 spheres. Her immediate and remote memory, attention, language skills and fund of knowledge are appropriate. There is no evidence of aphasia, agnosia, apraxia or anomia. Speech is clear with normal prosody and enunciation. Thought process is linear. Mood is normal and affect is normal.  Cranial nerves II - XII are as described above under HEENT exam.  Motor exam: Normal bulk, strength and tone is noted. There is no tremor. Fine motor skills and coordination: grossly intact.  Cerebellar testing: No dysmetria or intention tremor. There is no truncal or gait ataxia.  Sensory exam: intact to light touch in the upper and lower extremities.  Gait, station and balance: She stands with mild difficulty, has a slight limp.  She walks slowly and cautiously.  Assessment and Plan:  In summary, Natasha [name] is a very pleasant 55 y.o.-year old female with an underlying medical history of hypertension, history of ankle fracture on the left and right, mildly low vitamin D, and morbid obesity with a BMI of over 50, status post gastric bypass surgery in 2004 with reversal in 2017, whose history and physical exam are  concerning for obstructive sleep apnea (OSA). I had a long chat with the patient about my findings and the diagnosis of OSA, its prognosis and treatment options. We talked about medical treatments, surgical interventions and non-pharmacological approaches. I explained in particular the risks and ramifications of untreated moderate to severe OSA, especially with respect to developing cardiovascular disease down the Road, including congestive heart failure, difficult to treat hypertension, cardiac arrhythmias, or stroke. Even type 2 diabetes has, in part, been linked to untreated OSA. Symptoms of untreated OSA include daytime sleepiness, memory problems, mood irritability and mood disorder such as depression and anxiety, lack of energy, as well as recurrent headaches, especially morning headaches. We talked about trying to maintain a healthy lifestyle in general, as well as the importance of weight control. We also talked about the importance of good sleep hygiene.  She knows not to use alcohol as a sleep aid and certainly not to combine alcohol with Lunesta. I recommended the following at this time: sleep study.  I explained the sleep test procedure to the patient and also outlined possible surgical and non-surgical treatment options of OSA, including the use of a custom-made dental device (which would require a referral to a specialist dentist or oral surgeon), upper airway surgical options, such as traditional UPPP or a novel less invasive surgical option in the form of Inspire hypoglossal nerve stimulation (which would involve a referral to an ENT surgeon). I also explained the CPAP treatment option to the patient, who indicated that she would be willing to try CPAP if the need arises. I explained the importance of being compliant with PAP treatment, not only for insurance purposes but primarily to improve Her symptoms, and for the patient's long term health benefit, including to reduce Her cardiovascular  risks. I answered all her questions today and the patient was in agreement. I plan to see her back after the sleep study is completed and encouraged her to call with any interim questions, concerns, problems or updates.   Thank you very much for allowing me to participate in the care of this nice patient. If I can be of any further assistance to you please do not hesitate to call me at [phone].  Sincerely,   Huston Foley, MD, PhD

## 2021-02-27 ENCOUNTER — Other Ambulatory Visit (HOSPITAL_COMMUNITY): Payer: Self-pay | Admitting: Nurse Practitioner

## 2021-02-27 ENCOUNTER — Encounter: Payer: Self-pay | Admitting: Nurse Practitioner

## 2021-02-27 ENCOUNTER — Other Ambulatory Visit: Payer: Self-pay

## 2021-02-27 ENCOUNTER — Ambulatory Visit: Payer: 59 | Admitting: Nurse Practitioner

## 2021-02-27 VITALS — BP 128/72 | HR 61 | Temp 97.7°F | Ht 67.0 in | Wt 338.4 lb

## 2021-02-27 DIAGNOSIS — Z6841 Body Mass Index (BMI) 40.0 and over, adult: Secondary | ICD-10-CM | POA: Diagnosis not present

## 2021-02-27 DIAGNOSIS — G4709 Other insomnia: Secondary | ICD-10-CM | POA: Diagnosis not present

## 2021-02-27 MED ORDER — SAXENDA 18 MG/3ML ~~LOC~~ SOPN: 3.0000 mg | PEN_INJECTOR | Freq: Every day | SUBCUTANEOUS | 1 refills | Status: DC

## 2021-02-27 NOTE — Patient Instructions (Addendum)

## 2021-02-27 NOTE — Progress Notes (Signed)
I,Yamilka Roman [employer] as a Neurosurgeon for [employer], FNP.,have documented all relevant documentation on the behalf of Arnette Felts, FNP,as directed by  Arnette Felts, FNP while in the presence of Arnette Felts, FNP. This visit occurred during the SARS-CoV-2 public health emergency.  Safety protocols were in place, including screening questions prior to the visit, additional usage of staff PPE, and extensive cleaning of exam room while observing appropriate contact time as indicated for disinfecting solutions.  Subjective:     Patient ID: Natasha [name] , female    DOB: [birth year] , 55 y.o.   MRN: [id number]   Chief Complaint  Patient presents with  . Insomnia    HPI  Patient presents today for insomnia follow up. Pt does state she is experiencing more shortness of breath when she is walking.    She is able to sleep about 5 5 hours with the Lunesta.   She seen the Sleep provider yesterday and is awaiting approval from insurance.   She is having aching ankles   Wt Readings from Last 3 Encounters: 02/27/21 : (!) 338 lb 6.4 oz (153.5 kg) 02/26/21 : (!) 339 lb (153.8 kg) 01/23/21 : (!) 343 lb (155.6 kg)  Her scale at home was 336 lbs this morning. She has done vitamin B12 injections, phentermine. She had gastric bypass in 2004.      Insomnia Primary symptoms: no fragmented sleep, no sleep disturbance, no difficulty falling asleep, frequent awakening.  The current episode started more than one year. The onset quality is gradual. The problem occurs every several days. The problem has been gradually worsening since onset. The symptoms are aggravated by alcohol. How many beverages per day that contain caffeine: 2-3.  Past treatments include alcohol. Typical bedtime:  11-12 P.M..  PMH includes: no hypertension.     Past Medical History:  Diagnosis Date  . Hypertension      Family History  Problem Relation Age of Onset  . Kidney disease Mother   . Diabetes Father      Current  Outpatient Medications:  .  amLODipine (NORVASC) 5 MG tablet, Take 5 mg by mouth daily., Disp: , Rfl:  .  eszopiclone (LUNESTA) 2 MG TABS tablet, Take 1 tablet (2 mg total) by mouth at bedtime. Take immediately before bedtime, Disp: 30 tablet, Rfl: 2 .  Ibuprofen (ADVIL) 200 MG CAPS, Take 200 mg by mouth. Pt states she takes 200 in the morning 200 at night., Disp: , Rfl:  .  Liraglutide -Weight Management (SAXENDA) 18 MG/3ML SOPN, Inject 3 mg into the skin daily., Disp: 15 mL, Rfl: 1 .  metFORMIN (GLUCOPHAGE) 500 MG tablet, Take 1 tablet (500 mg total) by mouth 2 (two) times daily with a meal., Disp: 60 tablet, Rfl: 2 .  telmisartan-hydrochlorothiazide (MICARDIS HCT) 80-25 MG tablet, Take 1 tablet by mouth daily., Disp: , Rfl:    No Known Allergies   Review of Systems  Constitutional: Negative.   HENT: Negative.   Eyes: Negative.   Respiratory: Negative.   Cardiovascular: Negative.   Gastrointestinal: Negative.   Endocrine: Negative.   Genitourinary: Negative.   Musculoskeletal: Negative.   Skin: Negative.   Allergic/Immunologic: Negative.   Neurological: Negative.   Hematological: Negative.   Psychiatric/Behavioral: Negative for sleep disturbance. The patient has insomnia.      Today's Vitals   02/27/21 1015  BP: 128/72  Pulse: 61  Temp: 97.7 F (36.5 C)  SpO2: 99%  Weight: (!) 338 lb 6.4 oz (153.5 kg)  Height:  5\' 7"  (1.702 m)   Body mass index is 53 kg/m.  BP Readings from Last 3 Encounters:  02/27/21 128/72  02/26/21 122/78  01/23/21 (!) 140/92   Objective:  Physical Exam Vitals reviewed.  Constitutional:      General: She is not in acute distress.    Appearance: Normal appearance. She is obese.  Cardiovascular:     Rate and Rhythm: Normal rate and regular rhythm.     Pulses: Normal pulses.     Heart sounds: Normal heart sounds. No murmur heard.   Pulmonary:     Effort: Pulmonary effort is normal. No respiratory distress.     Breath sounds: Normal breath  sounds. No wheezing.  Skin:    General: Skin is warm and dry.     Capillary Refill: Capillary refill takes less than 2 seconds.  Neurological:     General: No focal deficit present.     Mental Status: She is alert and oriented to person, place, and time.     Cranial Nerves: No cranial nerve deficit.     Motor: No weakness.  Psychiatric:        Mood and Affect: Mood normal.        Behavior: Behavior normal.        Thought Content: Thought content normal.        Judgment: Judgment normal.         Assessment And Plan:     1. Other insomnia  She is doing well on the lunesta, she has been getting at least 5.5 hours of sleep a night  Continue with current dose.  2. Class 3 severe obesity due to excess calories without serious comorbidity with body mass index (BMI) of 50.0 to 59.9 in adult [facility])  Chronic  Discussed healthy diet and regular exercise options   Encouraged to exercise at least 150 minutes per week with 2 days of strength training  we have started Saxenda, discussed side effects to include nausea, difficulty swallowing and abdominal pain. She is to titrate weekly as tolerated. Goal to lose 10% body weight in 4 months if approved by insurance  Return in 2 months for weight check. - Liraglutide -Weight Management (SAXENDA) 18 MG/3ML SOPN; Inject 3 mg into the skin daily.  Dispense: 15 mL; Refill: 1   Patient was given opportunity to ask questions. Patient verbalized understanding of the plan and was able to repeat key elements of the plan. All questions were answered to their satisfaction.  11-21-1992, FNP   I, Arnette Felts, FNP, have reviewed all documentation for this visit. The documentation on 02/27/21 for the exam, diagnosis, procedures, and orders are all accurate and complete.   IF YOU HAVE BEEN REFERRED TO A SPECIALIST, IT MAY TAKE 1-2 WEEKS TO SCHEDULE/PROCESS THE REFERRAL. IF YOU HAVE NOT HEARD FROM US/SPECIALIST IN TWO WEEKS, PLEASE GIVE 03/01/21 A CALL AT  [phone] X [phone].   THE PATIENT IS ENCOURAGED TO PRACTICE SOCIAL DISTANCING DUE TO THE COVID-19 PANDEMIC.

## 2021-02-28 ENCOUNTER — Encounter: Payer: Self-pay | Admitting: Nurse Practitioner

## 2021-03-03 ENCOUNTER — Telehealth: Payer: Self-pay

## 2021-03-03 NOTE — Telephone Encounter (Signed)
Patient notified that Saxenda Pa has been approved through 06/29/21 [facility]

## 2021-03-04 MED FILL — METFORMIN HCL 500 MG TABS: 500 | 30 days supply | Qty: 60 | Fill #1

## 2021-03-04 MED FILL — ESZOPICLONE 2 MG TAB: 2 | 30 days supply | Qty: 30 | Fill #1

## 2021-03-04 MED FILL — SAXENDA 18 MG/3 ML PEN: 18 | 30 days supply | Qty: 15 | Fill #0

## 2021-03-05 ENCOUNTER — Other Ambulatory Visit: Payer: Self-pay

## 2021-03-05 ENCOUNTER — Encounter: Payer: Self-pay | Admitting: Nurse Practitioner

## 2021-03-05 ENCOUNTER — Telehealth: Payer: Self-pay

## 2021-03-05 ENCOUNTER — Other Ambulatory Visit (HOSPITAL_COMMUNITY): Payer: Self-pay | Admitting: Nurse Practitioner

## 2021-03-05 MED ORDER — NOVOFINE 30G X 8 MM MISC: 2 refills | Status: AC

## 2021-03-05 MED ORDER — ACCU-CHEK GUIDE ME W/DEVICE KIT: PACK | 3 refills | Status: DC

## 2021-03-05 MED FILL — UNIFINE PENTIPS 8MM 31G: 31G X 8 MM | 90 days supply | Qty: 100 | Fill #0

## 2021-03-05 NOTE — Telephone Encounter (Signed)
Spoke w/pt pen needles has been sent in, per JM pt needs to contact her insurance company and let us know which monitor they will pay for. Per pt she will do that. I contacted the pharmacy regarding the glucose monitor after receiving a text message, apparently the one that was ordered isn't covered by my insurance; my blood sugar may be more affected by me intermittent fasting as well ( 14:10) along with the Saxenda. I have been experiencing feeling weakness after meals.  Secondly, I am missing the needles for the prescription of Saxenda, I was advised that I am waiting on your office to submit the order  I am doing pretty good with the medication otherwise, I have lost 9 pounds since the last visit and I am very optimistic  Thank you for you help with this journey!

## 2021-03-06 ENCOUNTER — Other Ambulatory Visit (HOSPITAL_COMMUNITY): Payer: Self-pay | Admitting: Nurse Practitioner

## 2021-03-06 ENCOUNTER — Other Ambulatory Visit: Payer: Self-pay

## 2021-03-06 MED ORDER — FREESTYLE LANCETS MISC: 3 refills | Status: AC

## 2021-03-06 MED ORDER — FREESTYLE LITE TEST VI STRP: ORAL_STRIP | 3 refills | Status: DC

## 2021-03-06 MED ORDER — FREESTYLE LITE W/DEVICE KIT: 1.0000 | PACK | Freq: Three times a day (TID) | 1 refills | Status: AC

## 2021-03-06 MED FILL — FREESTYLE LANCETS: 90 days supply | Qty: 100 | Fill #0

## 2021-03-06 MED FILL — FREESTYLE LITE TEST STRIP: 90 days supply | Qty: 50 | Fill #0

## 2021-03-06 MED FILL — FREESTYLE LITE METER: W/DEVICE | 1 days supply | Qty: 1 | Fill #0

## 2021-03-10 ENCOUNTER — Encounter: Payer: Self-pay | Admitting: Nurse Practitioner

## 2021-03-27 ENCOUNTER — Other Ambulatory Visit (HOSPITAL_COMMUNITY): Payer: Self-pay

## 2021-03-28 ENCOUNTER — Other Ambulatory Visit (HOSPITAL_COMMUNITY): Payer: Self-pay

## 2021-03-31 ENCOUNTER — Other Ambulatory Visit (HOSPITAL_COMMUNITY): Payer: Self-pay

## 2021-03-31 MED FILL — Eszopiclone Tab 2 MG: ORAL | 30 days supply | Qty: 30 | Fill #0 | Status: AC

## 2021-03-31 MED FILL — Zoster Vac Recombinant Adjuvanted for IM Inj 50 MCG/0.5ML: INTRAMUSCULAR | 1 days supply | Qty: 1 | Fill #0 | Status: AC

## 2021-03-31 MED FILL — Metformin HCl Tab 500 MG: ORAL | 30 days supply | Qty: 60 | Fill #0 | Status: AC

## 2021-03-31 MED FILL — Eszopiclone Tab 2 MG: ORAL | 30 days supply | Qty: 30 | Fill #0 | Status: CN

## 2021-04-01 ENCOUNTER — Other Ambulatory Visit (HOSPITAL_COMMUNITY): Payer: Self-pay

## 2021-04-02 ENCOUNTER — Other Ambulatory Visit: Payer: Self-pay

## 2021-04-02 ENCOUNTER — Ambulatory Visit (INDEPENDENT_AMBULATORY_CARE_PROVIDER_SITE_OTHER): Payer: 59 | Admitting: Neurology

## 2021-04-02 DIAGNOSIS — Z9189 Other specified personal risk factors, not elsewhere classified: Secondary | ICD-10-CM

## 2021-04-02 DIAGNOSIS — R0683 Snoring: Secondary | ICD-10-CM

## 2021-04-02 DIAGNOSIS — G4700 Insomnia, unspecified: Secondary | ICD-10-CM

## 2021-04-02 DIAGNOSIS — R0681 Apnea, not elsewhere classified: Secondary | ICD-10-CM

## 2021-04-02 DIAGNOSIS — G4719 Other hypersomnia: Secondary | ICD-10-CM

## 2021-04-02 DIAGNOSIS — G4720 Circadian rhythm sleep disorder, unspecified type: Secondary | ICD-10-CM

## 2021-04-18 NOTE — Procedures (Signed)
PATIENT'S NAME:  Natasha, [name] DOB:      1966/01/26      MR#:    [id number]     DATE OF RECORDING: 04/02/2021 REFERRING M.D.:  Arnette Felts, FNP Study Performed:   Baseline Polysomnogram HISTORY: 55 year old woman with a history of hypertension, history of ankle fracture on the left and right, mildly low vitamin D, and morbid obesity with a BMI of over 50, status post weight loss surgery in 2004 with reversal in 2017, who reports snoring and witnessed apneas. The patient endorsed the Epworth Sleepiness Scale at 6 points. The patient's weight 339 pounds with a height of 67 (inches), resulting in a BMI of 53.3 kg/m2. The patient's neck circumference measured 16 inches.  CURRENT MEDICATIONS: Norvasc, Lunesta, Glucophage, Micardis HCT   PROCEDURE:  This is a multichannel digital polysomnogram utilizing the Somnostar 11.2 system.  Electrodes and sensors were applied and monitored per AASM Specifications.   EEG, EOG, Chin and Limb EMG, were sampled at 200 Hz.  ECG, Snore and Nasal Pressure, Thermal Airflow, Respiratory Effort, CPAP Flow and Pressure, Oximetry was sampled at 50 Hz. Digital video and audio were recorded.      BASELINE STUDY  Lights Out was at 22:02 and Lights On at 05:02.  Total recording time (TRT) was 420 minutes, with a total sleep time (TST) of 341.5 minutes.   The patient's sleep latency was 23.5 minutes.  REM latency was 166 minutes, which is delayed. The sleep efficiency was 81.3%.     SLEEP ARCHITECTURE: WASO (Wake after sleep onset) was 49 minutes, with mild to moderate sleep fragmentation noted. There were 42.5 minutes in Stage N1, 236.5 minutes Stage N2, 0 minutes Stage N3 and 62.5 minutes in Stage REM.  The percentage of Stage N1 was 12.4%, which is increased, Stage N2 was 69.3%, which is increased, Stage N3 was absent and Stage R (REM sleep) was 18.3%, which is near-normal.   The arousals were noted as: 38 were spontaneous, 0 were associated with PLMs, 1 were associated with  respiratory events.  RESPIRATORY ANALYSIS:  There were a total of 17 respiratory events:  0 obstructive apneas, 0 central apneas and 0 mixed apneas with a total of 0 apneas and an apnea index (AI) of 0 /hour. There were 17 hypopneas with a hypopnea index of 3. /hour. The patient also had 0 respiratory event related arousals (RERAs).      The total APNEA/HYPOPNEA INDEX (AHI) was 3./hour and the total RESPIRATORY DISTURBANCE INDEX was  3. /hour.  13 events occurred in REM sleep and 8 events in NREM. The REM AHI was  12.48 /hour, versus a non-REM AHI of .9. The patient spent 0 minutes of total sleep time in the supine position and 342 minutes in non-supine.. The supine AHI was 0.0 versus a non-supine AHI of 3.0.  OXYGEN SATURATION & C02:  The Wake baseline 02 saturation was 96%, with the lowest being 87%. Time spent below 89% saturation equaled 1 minutes.  PERIODIC LIMB MOVEMENTS: The patient had a total of 18 Periodic Limb Movements.  The Periodic Limb Movement (PLM) index was 3.2 and the PLM Arousal index was 0/hour.  Audio and video analysis did not show any abnormal or unusual movements, behaviors, phonations or vocalizations. The patient took no bathroom breaks. Mild to moderate snoring was noted. The EKG was in keeping with normal sinus rhythm (NSR).  Post-study, the patient indicated that sleep was better than usual.   IMPRESSION:  1. Primary Snoring 2. Dysfunctions  associated with sleep stages or arousal from sleep  RECOMMENDATIONS:  1. This study does not demonstrate any significant obstructive or central sleep disordered breathing, with the exception of snoring and mild REM related sleep apnea. The absence of supine sleep may have led to some underestimation of her sleep disordered breathing. With a total AHI of less than 5/hour (3.0/hour), treatment with a positive airway pressure device, such as CPAP or AutoPap is not recommended.  Weight loss and ongoing avoidance of the supine sleep  position may improve her snoring. 2. This study shows sleep fragmentation and abnormal sleep stage percentages; these are nonspecific findings and per se do not signify an intrinsic sleep disorder or a cause for the patient's sleep-related symptoms. Causes include (but are not limited to) the first night effect of the sleep study, circadian rhythm disturbances, medication effect or an underlying mood disorder or medical problem.  3. The patient should be cautioned not to drive, work at heights, or operate dangerous or heavy equipment when tired or sleepy. Review and reiteration of good sleep hygiene measures should be pursued with any patient. 4. The patient will be advised to follow up with the referring provider, who will be notified of the test results.  I certify that I have reviewed the entire raw data recording prior to the issuance of this report in accordance with the Standards of Accreditation of the American Academy of Sleep Medicine (AASM)  Huston Foley, MD, PhD Diplomat, American Board of Neurology and Sleep Medicine (Neurology and Sleep Medicine)

## 2021-04-18 NOTE — Progress Notes (Signed)
Patient referred by Arnette Felts, NP, seen by me on 02/26/21, diagnostic PSG on 04/02/21.   Please call and notify the patient that the recent sleep study did not show any significant obstructive sleep apnea, with the exception of snoring and mild REM related sleep apnea. The absence of supine sleep may have led to some underestimation of her sleep disordered breathing. With a total AHI of less than 5/hour (3.0/hour), treatment with a positive airway pressure device, such as CPAP or AutoPap is not recommended.  Weight loss and ongoing avoidance of the supine sleep position may improve her snoring. At this juncture, she can follow up with her PCP routinely.   Thanks,  Huston Foley, MD, PhD [facility])

## 2021-04-23 ENCOUNTER — Other Ambulatory Visit (HOSPITAL_COMMUNITY): Payer: Self-pay

## 2021-04-23 MED FILL — Liraglutide (Weight Mngmt) Soln Pen-Inj 18 MG/3ML (6 MG/ML): SUBCUTANEOUS | 30 days supply | Qty: 15 | Fill #0 | Status: AC

## 2021-04-24 ENCOUNTER — Ambulatory Visit: Payer: 59 | Admitting: Nurse Practitioner

## 2021-05-01 ENCOUNTER — Ambulatory Visit: Payer: 59 | Admitting: Nurse Practitioner

## 2021-05-01 ENCOUNTER — Other Ambulatory Visit (HOSPITAL_COMMUNITY): Payer: Self-pay

## 2021-05-01 ENCOUNTER — Other Ambulatory Visit: Payer: Self-pay

## 2021-05-01 ENCOUNTER — Encounter: Payer: Self-pay | Admitting: Nurse Practitioner

## 2021-05-01 VITALS — BP 122/84 | HR 77 | Temp 98.2°F | Ht 66.6 in | Wt 311.0 lb

## 2021-05-01 DIAGNOSIS — R7309 Other abnormal glucose: Secondary | ICD-10-CM | POA: Diagnosis not present

## 2021-05-01 DIAGNOSIS — Z114 Encounter for screening for human immunodeficiency virus [HIV]: Secondary | ICD-10-CM

## 2021-05-01 DIAGNOSIS — Z01419 Encounter for gynecological examination (general) (routine) without abnormal findings: Secondary | ICD-10-CM

## 2021-05-01 DIAGNOSIS — R232 Flushing: Secondary | ICD-10-CM

## 2021-05-01 DIAGNOSIS — Z6841 Body Mass Index (BMI) 40.0 and over, adult: Secondary | ICD-10-CM | POA: Diagnosis not present

## 2021-05-01 DIAGNOSIS — G4709 Other insomnia: Secondary | ICD-10-CM

## 2021-05-01 DIAGNOSIS — Z1231 Encounter for screening mammogram for malignant neoplasm of breast: Secondary | ICD-10-CM | POA: Diagnosis not present

## 2021-05-01 DIAGNOSIS — Z124 Encounter for screening for malignant neoplasm of cervix: Secondary | ICD-10-CM | POA: Diagnosis not present

## 2021-05-01 DIAGNOSIS — Z1211 Encounter for screening for malignant neoplasm of colon: Secondary | ICD-10-CM

## 2021-05-01 MED ORDER — SAXENDA 18 MG/3ML ~~LOC~~ SOPN
3.0000 mg | PEN_INJECTOR | Freq: Every day | SUBCUTANEOUS | 1 refills | Status: DC
  Filled 2021-05-01 – 2021-06-26 (×3): qty 15, 30d supply, fill #0

## 2021-05-01 MED ORDER — ESZOPICLONE 2 MG PO TABS
2.0000 mg | ORAL_TABLET | Freq: Every day | ORAL | 5 refills | Status: DC
  Filled 2021-05-01: qty 30, 30d supply, fill #0

## 2021-05-01 NOTE — Progress Notes (Signed)
I,Natasha [name] [employer] as a Education administrator for [employer], FNP.,have documented all relevant documentation on the behalf of Natasha Brine, FNP,as directed by  Natasha Brine, FNP while in the presence of Natasha [name], Natasha [name]. This visit occurred during the SARS-CoV-2 public health emergency.  Safety protocols were in place, including screening questions prior to the visit, additional usage of staff PPE, and extensive cleaning of exam room while observing appropriate contact time as indicated for disinfecting solutions.  Subjective:     Patient ID: Natasha [name] , female    DOB: Oct 21, 1966 , 55 y.o.   MRN: [id number]   Chief Complaint  Patient presents with  . Weight Check    HPI  Patient presents today for a weight check. Patient stated when she first started the saxenda it was really rough but now she is tolerating it well with diet changes.  She is no longer intermittent fasting due to causing her GI upset. When she is working she is not able to exercise as much.  She is having constipation. She is drinking at least 64 oz water a day.   Wt Readings from Last 3 Encounters: 05/01/21 : (!) 311 lb (141.1 kg) 02/27/21 : (!) 338 lb 6.4 oz (153.5 kg) 02/26/21 : (!) 339 lb (153.8 kg)    Past Medical History:  Diagnosis Date  . Hypertension      Family History  Problem Relation Age of Onset  . Kidney disease Mother   . Diabetes Father      Current Outpatient Medications:  .  amLODipine (NORVASC) 5 MG tablet, Take 5 mg by mouth daily., Disp: , Rfl:  .  Blood Glucose Monitoring Suppl (FREESTYLE LITE) w/Device KIT, 1 each by Does not apply route 3 (three) times daily. DX: E11.65, Disp: 1 kit, Rfl: 1 .  Blood Glucose Monitoring Suppl (FREESTYLE LITE) w/Device KIT, USE AS DIRECTED TO CHECK BLOOD SUGAR, Disp: 1 kit, Rfl: 1 .  Cholecalciferol (VITAMIN D) 125 MCG (5000 UT) CAPS, Take 1 capsule by mouth daily at 6 (six) AM., Disp: , Rfl:  .  glucose blood test strip, USE TO CHECK BLOOD SUGARS 3  TIMES A WEEK DX: E11.65 (Patient taking differently: USE TO CHECK BLOOD SUGARS 3 TIMES A WEEK DX: E11.65), Disp: 150 strip, Rfl: 3 .  Insulin Pen Needle (NOVOFINE) 30G X 8 MM MISC, USE AS DIRECTED WITH SAXENDA, Disp: 100 each, Rfl: 2 .  Insulin Pen Needle 31G X 8 MM MISC, USE AS DIRECTED WITH SAXENDA, Disp: 100 each, Rfl: 2 .  Lancets (FREESTYLE) lancets, Use to check blood sugars 3 times a week DX: E11.65, Disp: 150 each, Rfl: 3 .  metFORMIN (GLUCOPHAGE) 500 MG tablet, Take 1 tablet (500 mg total) by mouth 2 (two) times daily with a meal., Disp: 60 tablet, Rfl: 2 .  telmisartan-hydrochlorothiazide (MICARDIS HCT) 80-25 MG tablet, Take 1 tablet by mouth daily., Disp: , Rfl:  .  eszopiclone (LUNESTA) 2 MG TABS tablet, Take 1 tablet (2 mg total) by mouth daily at bedtime. Take immediately before bedime., Disp: 30 tablet, Rfl: 5 .  Liraglutide -Weight Management (SAXENDA) 18 MG/3ML SOPN, Inject 3 mg into the skin daily., Disp: 15 mL, Rfl: 1   No Known Allergies   Review of Systems  Constitutional: Negative.   Respiratory: Negative.  Negative for shortness of breath and wheezing.   Cardiovascular: Negative.  Negative for chest pain, palpitations and leg swelling.  Neurological: Negative.   Psychiatric/Behavioral: Negative.      Today's Vitals  05/01/21 1054  BP: 122/84  Pulse: 77  Temp: 98.2 F (36.8 C)  TempSrc: Oral  Weight: (!) 311 lb (141.1 kg)  Height: 5' 6.6" (1.692 m)  PainSc: 0-No pain   Body mass index is 49.3 kg/m.   Objective:  Physical Exam Vitals reviewed.  Constitutional:      General: She is not in acute distress.    Appearance: Normal appearance. She is obese.  Cardiovascular:     Rate and Rhythm: Normal rate and regular rhythm.     Pulses: Normal pulses.     Heart sounds: Normal heart sounds. No murmur heard.   Pulmonary:     Effort: Pulmonary effort is normal. No respiratory distress.     Breath sounds: Normal breath sounds. No wheezing.  Neurological:      General: No focal deficit present.     Mental Status: She is alert and oriented to person, place, and time.     Cranial Nerves: No cranial nerve deficit.     Motor: No weakness.  Psychiatric:        Mood and Affect: Mood normal.        Behavior: Behavior normal.        Thought Content: Thought content normal.        Judgment: Judgment normal.         Assessment And Plan:     1. Class 3 severe obesity due to excess calories with body mass index (BMI) of 45.0 to 49.9 in adult, unspecified whether serious comorbidity present (HCC)  Chronic, I have congratulated her on losing 23 lbs since her last visit  Discussed healthy diet and regular exercise options   Encouraged to exercise at least 150 minutes per week with 2 days of strength training  She is going to continue with the [place] for now as it is working if she comes to a plateau we can consider changing her medications - Liraglutide -Weight Management (SAXENDA) 18 MG/3ML SOPN; Inject 3 mg into the skin daily.  Dispense: 15 mL; Refill: 1  2. Other insomnia  Chronic, this has been effective for her - eszopiclone (LUNESTA) 2 MG TABS tablet; Take 1 tablet (2 mg total) by mouth daily at bedtime. Take immediately before bedime.  Dispense: 30 tablet; Refill: 5  3. Abnormal glucose  Chronic, will recheck HgbA1c today was 6 as last visit  Continue with current medications  Encouraged to limit intake of sugary foods and drinks - Hemoglobin A1c - CMP14+EGFR  4. Encounter for screening colonoscopy  According to USPTF Colorectal cancer Screening guidelines. Colonoscopy is recommended every 10 years, starting at age 30 years.  Will refer to GI for colon cancer screening. - Ambulatory referral to Gastroenterology  5. Screening mammogram, encounter for  Pt instructed on Self Breast Exam.According to ACOG guidelines Women aged 31 and older are recommended to get an annual mammogram. Form completed and given to patient contact the  [facility] for appointment scheduing.   Pt encouraged to get annual mammogram - MM DIGITAL SCREENING BILATERAL; Future  6. Encounter for gynecological examination  She would like to have her PAP done with a GYN due to having problems with hot flashes as well - Ambulatory referral to Obstetrics / Gynecology  7. Hot flashes - Ambulatory referral to Obstetrics / Gynecology  8. Encounter for HIV (human immunodeficiency virus) test - HIV antibody (with reflex)     Patient was given opportunity to ask questions. Patient verbalized understanding of the plan and was  able to repeat key elements of the plan. All questions were answered to their satisfaction.  Natasha Brine, FNP   I, Natasha Brine, FNP, have reviewed all documentation for this visit. The documentation on 05/01/21 for the exam, diagnosis, procedures, and orders are all accurate and complete.   IF YOU HAVE BEEN REFERRED TO A SPECIALIST, IT MAY TAKE 1-2 WEEKS TO SCHEDULE/PROCESS THE REFERRAL. IF YOU HAVE NOT HEARD FROM US/SPECIALIST IN TWO WEEKS, PLEASE GIVE [place] A CALL AT [phone].   THE PATIENT IS ENCOURAGED TO PRACTICE SOCIAL DISTANCING DUE TO THE COVID-19 PANDEMIC.

## 2021-05-01 NOTE — Patient Instructions (Signed)

## 2021-05-02 LAB — CMP14+EGFR
ALT: 11 IU/L (ref 0–32)
AST: 11 IU/L (ref 0–40)
Albumin/Globulin Ratio: 1.7 (ref 1.2–2.2)
Albumin: 4.6 g/dL (ref 3.8–4.9)
Alkaline Phosphatase: 103 IU/L (ref 44–121)
BUN/Creatinine Ratio: 13 (ref 9–23)
BUN: 11 mg/dL (ref 6–24)
Bilirubin Total: 0.4 mg/dL (ref 0.0–1.2)
CO2: 23 mmol/L (ref 20–29)
Calcium: 9.4 mg/dL (ref 8.7–10.2)
Chloride: 100 mmol/L (ref 96–106)
Creatinine, Ser: 0.84 mg/dL (ref 0.57–1.00)
Globulin, Total: 2.7 g/dL (ref 1.5–4.5)
Glucose: 76 mg/dL (ref 65–99)
Potassium: 4.2 mmol/L (ref 3.5–5.2)
Sodium: 139 mmol/L (ref 134–144)
Total Protein: 7.3 g/dL (ref 6.0–8.5)
eGFR: 83 mL/min/{1.73_m2} (ref >59)

## 2021-05-02 LAB — HEMOGLOBIN A1C
Est. average glucose Bld gHb Est-mCnc: 123 mg/dL
Hgb A1c MFr Bld: 5.9 % — ABNORMAL HIGH (ref 4.8–5.6)

## 2021-05-02 LAB — HIV ANTIBODY (ROUTINE TESTING W REFLEX): HIV Screen 4th Generation wRfx: NONREACTIVE

## 2021-05-09 ENCOUNTER — Other Ambulatory Visit (HOSPITAL_COMMUNITY): Payer: Self-pay

## 2021-05-20 ENCOUNTER — Encounter: Payer: Self-pay | Admitting: Nurse Practitioner

## 2021-05-20 ENCOUNTER — Other Ambulatory Visit: Payer: Self-pay | Admitting: Nurse Practitioner

## 2021-05-20 ENCOUNTER — Other Ambulatory Visit (HOSPITAL_COMMUNITY): Payer: Self-pay

## 2021-05-20 MED ORDER — METFORMIN HCL 500 MG PO TABS
500.0000 mg | ORAL_TABLET | Freq: Two times a day (BID) | ORAL | 2 refills | Status: DC
  Filled 2021-05-20 – 2021-06-26 (×2): qty 60, 30d supply, fill #0
  Filled 2021-08-15: qty 60, 30d supply, fill #1

## 2021-06-11 ENCOUNTER — Other Ambulatory Visit: Payer: Self-pay | Admitting: Nurse Practitioner

## 2021-06-11 ENCOUNTER — Other Ambulatory Visit (HOSPITAL_BASED_OUTPATIENT_CLINIC_OR_DEPARTMENT_OTHER): Payer: Self-pay

## 2021-06-11 ENCOUNTER — Other Ambulatory Visit (HOSPITAL_COMMUNITY): Payer: Self-pay

## 2021-06-11 MED ORDER — ESZOPICLONE 2 MG PO TABS
ORAL_TABLET | ORAL | 2 refills | Status: DC
  Filled 2021-06-11: qty 30, 30d supply, fill #0
  Filled 2021-10-02: qty 30, 30d supply, fill #1

## 2021-06-12 ENCOUNTER — Other Ambulatory Visit (HOSPITAL_BASED_OUTPATIENT_CLINIC_OR_DEPARTMENT_OTHER): Payer: Self-pay

## 2021-06-13 ENCOUNTER — Other Ambulatory Visit (HOSPITAL_BASED_OUTPATIENT_CLINIC_OR_DEPARTMENT_OTHER): Payer: Self-pay

## 2021-06-26 ENCOUNTER — Other Ambulatory Visit (HOSPITAL_BASED_OUTPATIENT_CLINIC_OR_DEPARTMENT_OTHER): Payer: Self-pay

## 2021-06-26 MED FILL — Insulin Pen Needle 31 G X 8 MM (1/3" or 5/16"): 90 days supply | Qty: 100 | Fill #0 | Status: AC

## 2021-07-01 ENCOUNTER — Other Ambulatory Visit (HOSPITAL_COMMUNITY): Payer: Self-pay

## 2021-07-01 ENCOUNTER — Ambulatory Visit: Payer: 59 | Admitting: Nurse Practitioner

## 2021-07-01 ENCOUNTER — Encounter: Payer: Self-pay | Admitting: Nurse Practitioner

## 2021-07-01 ENCOUNTER — Other Ambulatory Visit: Payer: Self-pay

## 2021-07-01 VITALS — BP 122/88 | HR 78 | Temp 98.9°F | Ht 66.0 in | Wt 305.0 lb

## 2021-07-01 DIAGNOSIS — Z6841 Body Mass Index (BMI) 40.0 and over, adult: Secondary | ICD-10-CM

## 2021-07-01 DIAGNOSIS — M25551 Pain in right hip: Secondary | ICD-10-CM

## 2021-07-01 MED ORDER — SAXENDA 18 MG/3ML ~~LOC~~ SOPN
3.0000 mg | PEN_INJECTOR | Freq: Every day | SUBCUTANEOUS | 1 refills | Status: DC
Start: 2021-07-01 — End: 2021-09-02
  Filled 2021-07-01 – 2021-07-25 (×2): qty 15, 30d supply, fill #0
  Filled 2021-08-29: qty 15, 30d supply, fill #1

## 2021-07-01 NOTE — Progress Notes (Signed)
I,Kamee Bobst,acting as a Education administrator for [name], FNP.,have documented all relevant documentation on the behalf of [name], FNP,as directed by  [name], FNP while in the presence of [name], Natasha [name].  This visit occurred during the SARS-CoV-2 public health emergency.  Safety protocols were in place, including screening questions prior to the visit, additional usage of staff PPE, and extensive cleaning of exam room while observing appropriate contact time as indicated for disinfecting solutions.  Subjective:     Patient ID: Natasha [name] , female    DOB: July 01, 1966 , 55 y.o.   MRN: [id number]   Chief Complaint  Patient presents with   Obesity     HPI  Patient presents today for a weight check. Patient stated she has no questions or concerns as of now. She is going to sch her colonoscopy to be done soon. Pt currently takes saxenda for weight loss. She is working with a Physiological scientist 2 times a week. She will go walking if she does not see him will walk. She is incorporating strength training. Does not have an appetite in the morning and will typically snack on healthy options.   Continues to have right hip pain when lying down at night. Has been using diclofenac.   Wt Readings from Last 3 Encounters: 07/01/21 : (!) 305 lb (138.3 kg) 05/01/21 : (!) 311 lb (141.1 kg) 02/27/21 : (!) 338 lb 6.4 oz (153.5 kg)       Past Medical History:  Diagnosis Date   Hypertension      Family History  Problem Relation Age of Onset   Kidney disease Mother    Diabetes Father      Current Outpatient Medications:    amLODipine (NORVASC) 5 MG tablet, Take 5 mg by mouth daily., Disp: , Rfl:    Blood Glucose Monitoring Suppl (FREESTYLE LITE) w/Device KIT, 1 each by Does not apply route 3 (three) times daily. DX: E11.65, Disp: 1 kit, Rfl: 1   Blood Glucose Monitoring Suppl (FREESTYLE LITE) w/Device KIT, USE AS DIRECTED TO CHECK BLOOD SUGAR, Disp: 1 kit, Rfl: 1   Cholecalciferol (VITAMIN  D) 125 MCG (5000 UT) CAPS, Take 1 capsule by mouth daily at 6 (six) AM., Disp: , Rfl:    eszopiclone (LUNESTA) 2 MG TABS tablet, Take 1 tablet (2 mg total) by mouth daily at bedtime. Take immediately before bedime., Disp: 30 tablet, Rfl: 5   eszopiclone (LUNESTA) 2 MG TABS tablet, TAKE 1 TABLET (2 MG TOTAL) BY MOUTH AT BEDTIME. TAKE IMMEDIATELY BEFORE BEDTIME, Disp: 30 tablet, Rfl: 2   glucose blood test strip, USE TO CHECK BLOOD SUGARS 3 TIMES A WEEK DX: E11.65 (Patient taking differently: USE TO CHECK BLOOD SUGARS 3 TIMES A WEEK DX: E11.65), Disp: 150 strip, Rfl: 3   Insulin Pen Needle (NOVOFINE) 30G X 8 MM MISC, USE AS DIRECTED WITH SAXENDA, Disp: 100 each, Rfl: 2   Insulin Pen Needle 31G X 8 MM MISC, USE AS DIRECTED WITH SAXENDA, Disp: 100 each, Rfl: 2   Lancets (FREESTYLE) lancets, Use to check blood sugars 3 times a week DX: E11.65, Disp: 150 each, Rfl: 3   metFORMIN (GLUCOPHAGE) 500 MG tablet, Take 1 tablet (500 mg total) by mouth 2 (two) times daily with a meal., Disp: 60 tablet, Rfl: 2   telmisartan-hydrochlorothiazide (MICARDIS HCT) 80-25 MG tablet, Take 1 tablet by mouth daily., Disp: , Rfl:    Liraglutide -Weight Management (SAXENDA) 18 MG/3ML SOPN, Inject 3 mg into the skin daily.,  Disp: 15 mL, Rfl: 1   No Known Allergies   Review of Systems  Constitutional: Negative.   Respiratory: Negative.    Cardiovascular: Negative.  Negative for chest pain, palpitations and leg swelling.  Neurological: Negative.  Negative for dizziness and headaches.  Psychiatric/Behavioral: Negative.      Today's Vitals   07/01/21 0937  BP: 122/88  Pulse: 78  Temp: 98.9 F (37.2 C)  Weight: (!) 305 lb (138.3 kg)  Height: _0  (1.676 m)   Body mass index is 49.23 kg/m.  Wt Readings from Last 3 Encounters:  07/01/21 (!) 305 lb (138.3 kg)  05/01/21 (!) 311 lb (141.1 kg)  02/27/21 (!) 338 lb 6.4 oz (153.5 kg)    Objective:  Physical Exam Vitals reviewed.  Constitutional:      General: She is  not in acute distress.    Appearance: Normal appearance. She is obese.  Cardiovascular:     Rate and Rhythm: Normal rate and regular rhythm.     Pulses: Normal pulses.     Heart sounds: Normal heart sounds. No murmur heard. Pulmonary:     Effort: Pulmonary effort is normal. No respiratory distress.     Breath sounds: Normal breath sounds. No wheezing.  Neurological:     General: No focal deficit present.     Mental Status: She is alert and oriented to person, place, and time.     Cranial Nerves: No cranial nerve deficit.     Motor: No weakness.  Psychiatric:        Mood and Affect: Mood normal.        Behavior: Behavior normal.        Thought Content: Thought content normal.        Judgment: Judgment normal.        Assessment And Plan:     1. Class 3 severe obesity due to excess calories with body mass index (BMI) of 45.0 to 49.9 in adult, unspecified whether serious comorbidity present ([place]) Comments: Continued weight loss of 6 lbs Encouraged to change her exercising or diet more for persistent weight loss She is encouraged to strive for BMI less than 30 to decrease cardiac risk. Advised to aim for at least 150 minutes of exercise per week.  - Liraglutide -Weight Management (SAXENDA) 18 MG/3ML SOPN; Inject 3 mg into the skin daily.  Dispense: 15 mL; Refill: 1  2. Right hip pain  She will continue to use the diclofenac and weight loss then see how she is doing before having a referral    Patient was given opportunity to ask questions. Patient verbalized understanding of the plan and was able to repeat key elements of the plan. All questions were answered to their satisfaction.  [name], FNP   I, [name], FNP, have reviewed all documentation for this visit. The documentation on 07/01/21 for the exam, diagnosis, procedures, and orders are all accurate and complete.   IF YOU HAVE BEEN REFERRED TO A SPECIALIST, IT MAY TAKE 1-2 WEEKS TO SCHEDULE/PROCESS THE REFERRAL. IF  YOU HAVE NOT HEARD FROM US/SPECIALIST IN TWO WEEKS, PLEASE GIVE [place] A CALL AT [phone].   THE PATIENT IS ENCOURAGED TO PRACTICE SOCIAL DISTANCING DUE TO THE COVID-19 PANDEMIC.

## 2021-07-01 NOTE — Patient Instructions (Addendum)
Exercising to Lose Weight Exercise is structured, repetitive physical activity to improve fitness and health. Getting regular exercise is important for everyone. It is especially important if you are overweight. Being overweight increases your risk of heart disease, stroke, diabetes, high blood pressure, and several types of cancer.Reducing your calorie intake and exercising can help you lose weight. Exercise is usually categorized as moderate or vigorous intensity. To lose weight, most people need to do a certain amount of moderate-intensity orvigorous-intensity exercise each week. Moderate-intensity exercise  Moderate-intensity exercise is any activity that gets you moving enough to burn at least three times more energy (calories) than if you were sitting. Examples of moderate exercise include: Walking a mile in 15 minutes. Doing light yard work. Biking at an easy pace. Most people should get at least 150 minutes (2 hours and 30 minutes) a week ofmoderate-intensity exercise to maintain their body weight. Vigorous-intensity exercise Vigorous-intensity exercise is any activity that gets you moving enough to burn at least six times more calories than if you were sitting. When you exercise at this intensity, you should be working hard enough that you are not able tocarry on a conversation. Examples of vigorous exercise include: Running. Playing a team sport, such as football, basketball, and soccer. Jumping rope. Most people should get at least 75 minutes (1 hour and 15 minutes) a week ofvigorous-intensity exercise to maintain their body weight. How can exercise affect me? When you exercise enough to burn more calories than you eat, you lose weight. Exercise also reduces body fat and builds muscle. The more muscle you have, the more calories you burn. Exercise also: Improves mood. Reduces stress and tension. Improves your overall fitness, flexibility, and endurance. Increases bone strength. The  amount of exercise you need to lose weight depends on: Your age. The type of exercise. Any health conditions you have. Your overall physical ability. Talk to your health care provider about how much exercise you need and whattypes of activities are safe for you. What actions can I take to lose weight? Nutrition  Make changes to your diet as told by your health care provider or diet and nutrition specialist (dietitian). This may include: Eating fewer calories. Eating more protein. Eating less unhealthy fats. Eating a diet that includes fresh fruits and vegetables, whole grains, low-fat dairy products, and lean protein. Avoiding foods with added fat, salt, and sugar. Drink plenty of water while you exercise to prevent dehydration or heat stroke.  Activity Choose an activity that you enjoy and set realistic goals. Your health care provider can help you make an exercise plan that works for you. Exercise at a moderate or vigorous intensity most days of the week. The intensity of exercise may vary from person to person. You can tell how intense a workout is for you by paying attention to your breathing and heartbeat. Most people will notice their breathing and heartbeat get faster with more intense exercise. Do resistance training twice each week, such as: Push-ups. Sit-ups. Lifting weights. Using resistance bands. Getting short amounts of exercise can be just as helpful as long structured periods of exercise. If you have trouble finding time to exercise, try to include exercise in your daily routine. Get up, stretch, and walk around every 30 minutes throughout the day. Go for a walk during your lunch break. Park your car farther away from your destination. If you take public transportation, get off one stop early and walk the rest of the way. Make phone calls while standing up and   walking around. Take the stairs instead of elevators or escalators. Wear comfortable clothes and shoes with  good support. Do not exercise so much that you hurt yourself, feel dizzy, or get very short of breath. Where to find more information U.S. Department of Health and Human Services: ThisPath.fi Centers for Disease Control and Prevention (CDC): FootballExhibition.com.br Contact a health care provider: Before starting a new exercise program. If you have questions or concerns about your weight. If you have a medical problem that keeps you from exercising. Get help right away if you have any of the following while exercising: Injury. Dizziness. Difficulty breathing or shortness of breath that does not go away when you stop exercising. Chest pain. Rapid heartbeat. Summary Being overweight increases your risk of heart disease, stroke, diabetes, high blood pressure, and several types of cancer. Losing weight happens when you burn more calories than you eat. Reducing the amount of calories you eat in addition to getting regular moderate or vigorous exercise each week helps you lose weight. This information is not intended to replace advice given to you by your health care provider. Make sure you discuss any questions you have with your healthcare provider. Document Revised: 03/04/2020 Document Reviewed: 03/21/2020 Elsevier Patient Education  2022 Elsevier Inc.   [facility] phone number for your colonoscopy [phone]   [facility] [phone]   [address]

## 2021-07-04 ENCOUNTER — Other Ambulatory Visit: Payer: 59

## 2021-07-16 ENCOUNTER — Encounter: Payer: Self-pay | Admitting: Nurse Practitioner

## 2021-07-25 ENCOUNTER — Other Ambulatory Visit (HOSPITAL_BASED_OUTPATIENT_CLINIC_OR_DEPARTMENT_OTHER): Payer: Self-pay

## 2021-07-28 ENCOUNTER — Other Ambulatory Visit (HOSPITAL_BASED_OUTPATIENT_CLINIC_OR_DEPARTMENT_OTHER): Payer: Self-pay

## 2021-07-29 ENCOUNTER — Other Ambulatory Visit (HOSPITAL_BASED_OUTPATIENT_CLINIC_OR_DEPARTMENT_OTHER): Payer: Self-pay

## 2021-07-30 ENCOUNTER — Telehealth: Payer: Self-pay

## 2021-07-30 ENCOUNTER — Other Ambulatory Visit (HOSPITAL_BASED_OUTPATIENT_CLINIC_OR_DEPARTMENT_OTHER): Payer: Self-pay

## 2021-07-30 NOTE — Telephone Encounter (Signed)
Attempted to call pt to let her know her PA for Bernie Covey has been approved through 07/28/22. YL,RMA

## 2021-07-31 ENCOUNTER — Ambulatory Visit (HOSPITAL_BASED_OUTPATIENT_CLINIC_OR_DEPARTMENT_OTHER)
Admission: RE | Admit: 2021-07-31 | Discharge: 2021-07-31 | Disposition: A | Discharge status: Home / Self Care | Payer: 59 | Source: Ambulatory Visit | Attending: Nurse Practitioner | Admitting: Nurse Practitioner

## 2021-07-31 ENCOUNTER — Other Ambulatory Visit (HOSPITAL_BASED_OUTPATIENT_CLINIC_OR_DEPARTMENT_OTHER): Payer: Self-pay

## 2021-07-31 ENCOUNTER — Other Ambulatory Visit: Payer: Self-pay | Admitting: Nurse Practitioner

## 2021-07-31 ENCOUNTER — Encounter (HOSPITAL_BASED_OUTPATIENT_CLINIC_OR_DEPARTMENT_OTHER): Payer: Self-pay | Admitting: Radiology

## 2021-07-31 ENCOUNTER — Other Ambulatory Visit: Payer: Self-pay

## 2021-07-31 DIAGNOSIS — Z1231 Encounter for screening mammogram for malignant neoplasm of breast: Secondary | ICD-10-CM | POA: Diagnosis not present

## 2021-07-31 MED ORDER — CARESTART COVID-19 HOME TEST VI KIT
PACK | 0 refills | Status: DC
  Filled 2021-07-31: qty 4, 4d supply, fill #0

## 2021-08-05 ENCOUNTER — Encounter: Payer: Self-pay | Admitting: Internal Medicine

## 2021-08-06 ENCOUNTER — Other Ambulatory Visit: Payer: Self-pay | Admitting: Nurse Practitioner

## 2021-08-06 DIAGNOSIS — R928 Other abnormal and inconclusive findings on diagnostic imaging of breast: Secondary | ICD-10-CM

## 2021-08-07 ENCOUNTER — Encounter: Payer: 59 | Admitting: Obstetrics and Gynecology

## 2021-08-15 ENCOUNTER — Encounter: Payer: Self-pay | Admitting: Internal Medicine

## 2021-08-15 ENCOUNTER — Other Ambulatory Visit (HOSPITAL_BASED_OUTPATIENT_CLINIC_OR_DEPARTMENT_OTHER): Payer: Self-pay

## 2021-08-15 ENCOUNTER — Ambulatory Visit: Payer: 59 | Admitting: Internal Medicine

## 2021-08-15 DIAGNOSIS — K219 Gastro-esophageal reflux disease without esophagitis: Secondary | ICD-10-CM

## 2021-08-15 DIAGNOSIS — Z1211 Encounter for screening for malignant neoplasm of colon: Secondary | ICD-10-CM | POA: Diagnosis not present

## 2021-08-15 NOTE — Progress Notes (Signed)
Chief Complaint: Consideration of colonoscopy  HPI : 21 female with history of pre-diabetes, obesity s/p gastric bypass in 2004, and hypertension presents for consideration of colonoscopy.  Her last colonoscopy was 5+ years ago that showed small polyps. She thinks that she is overdue. Denies melena, hematochezia, diarrhea, constipation, weight loss. She is actively trying to lose weight. Has family history of colon cancer in her maternal grandmother at an unclear age. Her only abdominal surgery is a gastric bypass.   She has issues with GERD that are not excessively bothersome. She is currently taking Rolaids PRN. Used to be on omeprazole. Denies dysphagia, N&V, or abdominal pain.  BMI of 49.2 on 07/01/21   Past Medical History:  Diagnosis Date   Colon polyps    GERD (gastroesophageal reflux disease)    Hypertension    Obesity      Past Surgical History:  Procedure Laterality Date   AUGMENTATION MAMMAPLASTY     GASTRIC BYPASS     Family History  Problem Relation Age of Onset   Kidney disease Mother    Hypertension Mother    Heart disease Father    Diabetes Father    Colon cancer Maternal Grandmother    Social History   Tobacco Use   Smoking status: Never   Smokeless tobacco: Never  Vaping Use   Vaping Use: Never used  Substance Use Topics   Alcohol use: Yes    Comment: 1 drink per night   Drug use: Never   Current Outpatient Medications  Medication Sig Dispense Refill   amLODipine (NORVASC) 5 MG tablet Take 5 mg by mouth daily.     Blood Glucose Monitoring Suppl (FREESTYLE LITE) w/Device KIT 1 each by Does not apply route 3 (three) times daily. DX: E11.65 1 kit 1   Blood Glucose Monitoring Suppl (FREESTYLE LITE) w/Device KIT USE AS DIRECTED TO CHECK BLOOD SUGAR 1 kit 1   Ca Carbonate-Mag Hydroxide (ROLAIDS PO) Take 1 tablet by mouth as needed.     Cholecalciferol (VITAMIN D) 125 MCG (5000 UT) CAPS Take 1 capsule by mouth daily at 6 (six) AM.     eszopiclone  (LUNESTA) 2 MG TABS tablet TAKE 1 TABLET (2 MG TOTAL) BY MOUTH AT BEDTIME. TAKE IMMEDIATELY BEFORE BEDTIME 30 tablet 2   glucose blood test strip USE TO CHECK BLOOD SUGARS 3 TIMES A WEEK DX: E11.65 (Patient taking differently: USE TO CHECK BLOOD SUGARS 3 TIMES A WEEK DX: E11.65) 150 strip 3   Insulin Pen Needle (NOVOFINE) 30G X 8 MM MISC USE AS DIRECTED WITH SAXENDA 100 each 2   Insulin Pen Needle 31G X 8 MM MISC USE AS DIRECTED WITH SAXENDA 100 each 2   Lancets (FREESTYLE) lancets Use to check blood sugars 3 times a week DX: E11.65 150 each 3   Liraglutide -Weight Management (SAXENDA) 18 MG/3ML SOPN Inject 3 mg into the skin daily. 15 mL 1   metFORMIN (GLUCOPHAGE) 500 MG tablet Take 1 tablet (500 mg total) by mouth 2 (two) times daily with a meal. 60 tablet 2   Nutritional Supplements (ESTROVEN PO) Take 1 tablet by mouth daily.     telmisartan-hydrochlorothiazide (MICARDIS HCT) 80-25 MG tablet Take 1 tablet by mouth daily.     No current facility-administered medications for this visit.   No Known Allergies   Review of Systems: All systems reviewed and negative except where noted in HPI.   Physical Exam: BP 108/76 (BP Location: Right Arm, Patient Position: Sitting, Cuff Size: Large)  Pulse 84   Ht 5' 6"  (1.676 m)   Wt (!) 304 lb 8 oz (138.1 kg)   SpO2 99%   BMI 49.15 kg/m  Constitutional: Pleasant,well-developed, female in no acute distress. HEENT: Normocephalic and atraumatic. Conjunctivae are normal.  Cardiovascular: Normal rate, regular rhythm.  Pulmonary/chest: Effort normal and breath sounds normal. No wheezing, rales or rhonchi. Abdominal: Soft, nondistended, nontender. Bowel sounds active throughout. Extremities: no edema Lymphadenopathy: No cervical adenopathy noted. Neurological: Alert and oriented to person place and time. Skin: Skin is warm and dry. No rashes noted. Psychiatric: Normal mood and affect. Behavior is normal.  Labs 05/01/21: Nml CMP  ASSESSMENT AND  PLAN:  GERD Colon cancer screening Discussed colonoscopy procedure with the patient today. She does have family history of colon cancer, though only in a 2nd degree relative. Last colonoscopy was >5 years ago with some small polyps. We will attempt to get a formal report of her prior colonoscopy. BMI is appropriate for [facility] at this time. GERD is relatively well controlled with PRN meds.  - Continue PRN Rolaids - Plan for colonoscopy in [place] - Attempt to obtain prior colonoscopy records from [facility] [phone]), name was [facility]  Christia Reading, MD

## 2021-08-15 NOTE — Patient Instructions (Signed)
If you are age 54 or younger, your body mass index should be between 19-25. Your Body mass index is 49.15 kg/m. If this is out of the aformentioned range listed, please consider follow up with your Primary Care Provider.  _________________________________________________________  The [facility] providers would like to encourage you to use [facility] to communicate with providers for non-urgent requests or questions.  Due to long hold times on the telephone, sending your provider a message by [facility] may be a faster and more efficient way to get a response.  Please allow 48 business hours for a response.  Please remember that this is for non-urgent requests.   You have been scheduled for a colonoscopy. Please follow written instructions given to you at your visit today.  Please pick up your prep supplies at the pharmacy within the next 1-3 days. If you use inhalers (even only as needed), please bring them with you on the day of your procedure.  Due to recent changes in healthcare laws, you may see the results of your imaging and laboratory studies on MyChart before your provider has had a chance to review them.  We understand that in some cases there may be results that are confusing or concerning to you. Not all laboratory results come back in the same time frame and the provider may be waiting for multiple results in order to interpret others.  Please give [place] 48 hours in order for your provider to thoroughly review all the results before contacting the office for clarification of your results.   We will attempt to obtain your records from Dr Adrian Prince [phone].  Thank you for entrusting me with your care and choosing [facility].  Dr Leonides Schanz

## 2021-08-20 ENCOUNTER — Other Ambulatory Visit: Payer: Self-pay

## 2021-08-20 ENCOUNTER — Ambulatory Visit
Admission: RE | Admit: 2021-08-20 | Discharge: 2021-08-20 | Disposition: A | Discharge status: Home / Self Care | Payer: 59 | Source: Ambulatory Visit | Attending: Nurse Practitioner | Admitting: Nurse Practitioner

## 2021-08-20 DIAGNOSIS — N6011 Diffuse cystic mastopathy of right breast: Secondary | ICD-10-CM | POA: Diagnosis not present

## 2021-08-20 DIAGNOSIS — R928 Other abnormal and inconclusive findings on diagnostic imaging of breast: Secondary | ICD-10-CM

## 2021-08-29 ENCOUNTER — Other Ambulatory Visit (HOSPITAL_BASED_OUTPATIENT_CLINIC_OR_DEPARTMENT_OTHER): Payer: Self-pay

## 2021-09-02 ENCOUNTER — Other Ambulatory Visit: Payer: Self-pay

## 2021-09-02 ENCOUNTER — Other Ambulatory Visit (HOSPITAL_BASED_OUTPATIENT_CLINIC_OR_DEPARTMENT_OTHER): Payer: Self-pay

## 2021-09-02 ENCOUNTER — Encounter: Payer: Self-pay | Admitting: Nurse Practitioner

## 2021-09-02 ENCOUNTER — Ambulatory Visit: Payer: 59 | Admitting: Nurse Practitioner

## 2021-09-02 VITALS — Temp 98.7°F | Ht 66.0 in | Wt 294.0 lb

## 2021-09-02 DIAGNOSIS — K219 Gastro-esophageal reflux disease without esophagitis: Secondary | ICD-10-CM | POA: Diagnosis not present

## 2021-09-02 DIAGNOSIS — Z6841 Body Mass Index (BMI) 40.0 and over, adult: Secondary | ICD-10-CM

## 2021-09-02 DIAGNOSIS — R059 Cough, unspecified: Secondary | ICD-10-CM | POA: Diagnosis not present

## 2021-09-02 DIAGNOSIS — Z23 Encounter for immunization: Secondary | ICD-10-CM | POA: Diagnosis not present

## 2021-09-02 MED ORDER — SAXENDA 18 MG/3ML ~~LOC~~ SOPN
3.0000 mg | PEN_INJECTOR | Freq: Every day | SUBCUTANEOUS | 1 refills | Status: DC
  Filled 2021-09-02 – 2021-10-02 (×2): qty 15, 30d supply, fill #0

## 2021-09-02 MED ORDER — OMEPRAZOLE 20 MG PO CPDR
20.0000 mg | DELAYED_RELEASE_CAPSULE | Freq: Every day | ORAL | 1 refills | Status: DC
Start: 2021-09-02 — End: 2022-03-02
  Filled 2021-09-02: qty 30, 30d supply, fill #0

## 2021-09-02 NOTE — Progress Notes (Signed)
I,Victoria Hamilton,acting as a Education administrator for [name], FNP.,have documented all relevant documentation on the behalf of [name], FNP,as directed by  [name], FNP while in the presence of [name], [place].  This visit occurred during the SARS-CoV-2 public health emergency.  Safety protocols were in place, including screening questions prior to the visit, additional usage of staff PPE, and extensive cleaning of exam room while observing appropriate contact time as indicated for disinfecting solutions.  Subjective:     Patient ID: Natasha [name] , female    DOB: [birth year] , 55 y.o.   MRN: [id number]   Chief Complaint  Patient presents with   Obesity    HPI  Pt presents today for weight check. She continues to see a Physiological scientist 2 times a week. She went on her first hike at [employer].  States "I feel better", and is able to do certain activities that she could not do before.   She complains of bad indigestion, she does have stomach pains from time being she is experiencing the bloating and lots of gas. She did previously have GERD, admitting to having a few drinks recently, she feels she made it flare back up. She also reports to not having an appetite at the moment, just staying on top of drinking lots of fluid.  She does admit to having a cough, tested for covid, negative.  She had been drinking more alcohol since going through concerns with her son.   Wt Readings from Last 3 Encounters: 09/02/21 : 294 lb (133.4 kg) 08/15/21 : (!) 304 lb 8 oz (138.1 kg) 07/01/21 : (!) 305 lb (138.3 kg)       Past Medical History:  Diagnosis Date   Colon polyps    GERD (gastroesophageal reflux disease)    Hypertension    Obesity      Family History  Problem Relation Age of Onset   Kidney disease Mother    Hypertension Mother    Heart disease Father    Diabetes Father    Colon cancer Maternal Grandmother      Current Outpatient Medications:    amLODipine (NORVASC) 5 MG  tablet, Take 5 mg by mouth daily., Disp: , Rfl:    Blood Glucose Monitoring Suppl (FREESTYLE LITE) w/Device KIT, 1 each by Does not apply route 3 (three) times daily. DX: E11.65, Disp: 1 kit, Rfl: 1   Blood Glucose Monitoring Suppl (FREESTYLE LITE) w/Device KIT, USE AS DIRECTED TO CHECK BLOOD SUGAR, Disp: 1 kit, Rfl: 1   Cholecalciferol (VITAMIN D) 125 MCG (5000 UT) CAPS, Take 1 capsule by mouth daily at 6 (six) AM., Disp: , Rfl:    eszopiclone (LUNESTA) 2 MG TABS tablet, TAKE 1 TABLET (2 MG TOTAL) BY MOUTH AT BEDTIME. TAKE IMMEDIATELY BEFORE BEDTIME, Disp: 30 tablet, Rfl: 2   glucose blood test strip, USE TO CHECK BLOOD SUGARS 3 TIMES A WEEK DX: E11.65 (Patient taking differently: USE TO CHECK BLOOD SUGARS 3 TIMES A WEEK DX: E11.65), Disp: 150 strip, Rfl: 3   Insulin Pen Needle (NOVOFINE) 30G X 8 MM MISC, USE AS DIRECTED WITH SAXENDA, Disp: 100 each, Rfl: 2   Insulin Pen Needle 31G X 8 MM MISC, USE AS DIRECTED WITH SAXENDA, Disp: 100 each, Rfl: 2   Lancets (FREESTYLE) lancets, Use to check blood sugars 3 times a week DX: E11.65, Disp: 150 each, Rfl: 3   metFORMIN (GLUCOPHAGE) 500 MG tablet, Take 1 tablet (500 mg total) by mouth 2 (two) times daily  with a meal., Disp: 60 tablet, Rfl: 2   Nutritional Supplements (ESTROVEN PO), Take 1 tablet by mouth daily., Disp: , Rfl:    omeprazole (PRILOSEC) 20 MG capsule, Take 1 capsule (20 mg total) by mouth daily., Disp: 30 capsule, Rfl: 1   telmisartan-hydrochlorothiazide (MICARDIS HCT) 80-25 MG tablet, Take 1 tablet by mouth daily., Disp: , Rfl:    vitamin B-12 (CYANOCOBALAMIN) 500 MCG tablet, Take 500 mcg by mouth daily., Disp: , Rfl:    Ca Carbonate-Mag Hydroxide (ROLAIDS PO), Take 1 tablet by mouth as needed. (Patient not taking: Reported on 09/02/2021), Disp: , Rfl:    Liraglutide -Weight Management (SAXENDA) 18 MG/3ML SOPN, Inject 3 mg into the skin daily., Disp: 15 mL, Rfl: 1   No Known Allergies   Review of Systems  Constitutional: Negative.    Respiratory: Negative.    Cardiovascular: Negative.  Negative for chest pain, palpitations and leg swelling.  Neurological: Negative.  Negative for dizziness and headaches.  Psychiatric/Behavioral: Negative.      Today's Vitals   09/02/21 1031  Temp: 98.7 F (37.1 C)  Weight: 294 lb (133.4 kg)  Height: 5' 6"  (1.676 m)  PainSc: 4    Body mass index is 47.45 kg/m.   Objective:  Physical Exam Vitals reviewed.  Constitutional:      General: She is not in acute distress.    Appearance: Normal appearance. She is obese.  Cardiovascular:     Rate and Rhythm: Normal rate and regular rhythm.     Pulses: Normal pulses.     Heart sounds: Normal heart sounds. No murmur heard. Pulmonary:     Effort: Pulmonary effort is normal. No respiratory distress.     Breath sounds: Normal breath sounds. No wheezing.  Neurological:     General: No focal deficit present.     Mental Status: She is alert and oriented to person, place, and time.     Cranial Nerves: No cranial nerve deficit.     Motor: No weakness.  Psychiatric:        Mood and Affect: Mood normal.        Behavior: Behavior normal.        Thought Content: Thought content normal.        Judgment: Judgment normal.        Assessment And Plan:     1. Cough Comments: Dry, hacking cough that is causing her discomfort to her abdomen Will treat as reflux, take omeprazole for 2 weeks and take Restora as well. - omeprazole (PRILOSEC) 20 MG capsule; Take 1 capsule (20 mg total) by mouth daily.  Dispense: 30 capsule; Refill: 1  2. Gastroesophageal reflux disease without esophagitis Comments: Avoid spicy and fatty foods - omeprazole (PRILOSEC) 20 MG capsule; Take 1 capsule (20 mg total) by mouth daily.  Dispense: 30 capsule; Refill: 1  3. Class 3 severe obesity due to excess calories with body mass index (BMI) of 45.0 to 49.9 in adult, unspecified whether serious comorbidity present ([place]) Comments: Continued weight loss of 6  lbs Encouraged to change her exercising or diet more for persistent weight loss Continue Saxenda - Liraglutide -Weight Management (SAXENDA) 18 MG/3ML SOPN; Inject 3 mg into the skin daily.  Dispense: 15 mL; Refill: 1  4. Need for influenza vaccination Influenza vaccine administered Encouraged to take Tylenol as needed for fever or muscle aches. - Flu Vaccine QUAD 6+ mos PF IM (Fluarix Quad PF)     Patient was given opportunity to ask questions. Patient verbalized  understanding of the plan and was able to repeat key elements of the plan. All questions were answered to their satisfaction.  [name], FNP   I, [name], FNP, have reviewed all documentation for this visit. The documentation on 09/03/21 for the exam, diagnosis, procedures, and orders are all accurate and complete.   IF YOU HAVE BEEN REFERRED TO A SPECIALIST, IT MAY TAKE 1-2 WEEKS TO SCHEDULE/PROCESS THE REFERRAL. IF YOU HAVE NOT HEARD FROM US/SPECIALIST IN TWO WEEKS, PLEASE GIVE [place] A CALL AT [phone].   THE PATIENT IS ENCOURAGED TO PRACTICE SOCIAL DISTANCING DUE TO THE COVID-19 PANDEMIC.

## 2021-09-02 NOTE — Patient Instructions (Signed)

## 2021-09-12 ENCOUNTER — Ambulatory Visit (AMBULATORY_SURGERY_CENTER): Payer: 59 | Admitting: Internal Medicine

## 2021-09-12 ENCOUNTER — Other Ambulatory Visit: Payer: Self-pay

## 2021-09-12 ENCOUNTER — Encounter: Payer: Self-pay | Admitting: Internal Medicine

## 2021-09-12 ENCOUNTER — Other Ambulatory Visit (HOSPITAL_BASED_OUTPATIENT_CLINIC_OR_DEPARTMENT_OTHER): Payer: Self-pay

## 2021-09-12 VITALS — BP 130/89 | HR 84 | Temp 96.6°F | Resp 12 | Ht 66.0 in | Wt 294.0 lb

## 2021-09-12 DIAGNOSIS — Z1211 Encounter for screening for malignant neoplasm of colon: Secondary | ICD-10-CM

## 2021-09-12 DIAGNOSIS — Z538 Procedure and treatment not carried out for other reasons: Secondary | ICD-10-CM

## 2021-09-12 MED ORDER — PEG 3350-KCL-NABCB-NACL-NASULF 236 G PO SOLR
4000.0000 mL | Freq: Once | ORAL | 0 refills | Status: AC
  Filled 2021-09-12: qty 4000, 2d supply, fill #0

## 2021-09-12 MED ORDER — SODIUM CHLORIDE 0.9 % IV SOLN: 500.0000 mL | INTRAVENOUS | Status: DC

## 2021-09-12 NOTE — Progress Notes (Signed)
GASTROENTEROLOGY PROCEDURE H&P NOTE   Primary Care Physician: [name], FNP    Reason for Procedure:   Colon cancer screening  Plan:    Colonoscopy  Patient is appropriate for endoscopic procedure(s) in the ambulatory ([facility]) setting.  The nature of the procedure, as well as the risks, benefits, and alternatives were carefully and thoroughly reviewed with the patient. Ample time for discussion and questions allowed. The patient understood, was satisfied, and agreed to proceed.     HPI: Natasha [name] is a 55 y.o. female who presents for colonoscopy for evaluation of colon cancer screening .  Patient was most recently seen in the Gastroenterology Clinic on 08/15/21.  No interval change in medical history since that appointment. Please refer to that note for full details regarding GI history and clinical presentation.   Past Medical History:  Diagnosis Date   Colon polyps    Diabetes mellitus without complication ([place])    pre diabetes   GERD (gastroesophageal reflux disease)    Hypertension    Obesity     Past Surgical History:  Procedure Laterality Date   AUGMENTATION MAMMAPLASTY     GASTRIC BYPASS      Prior to Admission medications   Medication Sig Start Date End Date Taking? Authorizing Provider  amLODipine (NORVASC) 5 MG tablet Take 5 mg by mouth daily.   Yes [provider]  Blood Glucose Monitoring Suppl (FREESTYLE LITE) w/Device KIT 1 each by Does not apply route 3 (three) times daily. DX: E11.65 03/06/21  Yes [name], FNP  Blood Glucose Monitoring Suppl (FREESTYLE LITE) w/Device KIT USE AS DIRECTED TO CHECK BLOOD SUGAR 03/06/21 03/06/22 Yes [name], FNP  Cholecalciferol (VITAMIN D) 125 MCG (5000 UT) CAPS Take 1 capsule by mouth daily at 6 (six) AM.   Yes [provider]  eszopiclone (LUNESTA) 2 MG TABS tablet TAKE 1 TABLET (2 MG TOTAL) BY MOUTH AT BEDTIME. TAKE IMMEDIATELY BEFORE BEDTIME 06/11/21 12/08/21 Yes [name], FNP  glucose  blood test strip USE TO CHECK BLOOD SUGARS 3 TIMES A WEEK DX: E11.65 Patient taking differently: USE TO CHECK BLOOD SUGARS 3 TIMES A WEEK DX: E11.65 03/06/21 03/06/22 Yes [name], FNP  Insulin Pen Needle (NOVOFINE) 30G X 8 MM MISC USE AS DIRECTED WITH SAXENDA 03/05/21  Yes [name], FNP  Insulin Pen Needle 31G X 8 MM MISC USE AS DIRECTED WITH SAXENDA 03/05/21 03/05/22 Yes [name], FNP  Lancets (FREESTYLE) lancets Use to check blood sugars 3 times a week DX: E11.65 03/06/21  Yes [name], FNP  Liraglutide -Weight Management (SAXENDA) 18 MG/3ML SOPN Inject 3 mg into the skin daily. 09/02/21  Yes [name], FNP  metFORMIN (GLUCOPHAGE) 500 MG tablet Take 1 tablet (500 mg total) by mouth 2 (two) times daily with a meal. 05/20/21  Yes [name], FNP  Nutritional Supplements (ESTROVEN PO) Take 1 tablet by mouth daily.   Yes [provider]  omeprazole (PRILOSEC) 20 MG capsule Take 1 capsule (20 mg total) by mouth daily. 09/02/21  Yes [name], FNP  telmisartan-hydrochlorothiazide (MICARDIS HCT) 80-25 MG tablet Take 1 tablet by mouth daily.   Yes [provider]  vitamin B-12 (CYANOCOBALAMIN) 500 MCG tablet Take 500 mcg by mouth daily.   Yes [provider]  Ca Carbonate-Mag Hydroxide (ROLAIDS PO) Take 1 tablet by mouth as needed. Patient not taking: No sig reported    [provider]    Current Outpatient Medications  Medication Sig Dispense Refill  amLODipine (NORVASC) 5 MG tablet Take 5 mg by mouth daily.     Blood Glucose Monitoring Suppl (FREESTYLE LITE) w/Device KIT 1 each by Does not apply route 3 (three) times daily. DX: E11.65 1 kit 1   Blood Glucose Monitoring Suppl (FREESTYLE LITE) w/Device KIT USE AS DIRECTED TO CHECK BLOOD SUGAR 1 kit 1   Cholecalciferol (VITAMIN D) 125 MCG (5000 UT) CAPS Take 1 capsule by mouth daily at 6 (six) AM.     eszopiclone (LUNESTA) 2 MG TABS tablet TAKE 1 TABLET (2 MG TOTAL) BY MOUTH AT BEDTIME. TAKE  IMMEDIATELY BEFORE BEDTIME 30 tablet 2   glucose blood test strip USE TO CHECK BLOOD SUGARS 3 TIMES A WEEK DX: E11.65 (Patient taking differently: USE TO CHECK BLOOD SUGARS 3 TIMES A WEEK DX: E11.65) 150 strip 3   Insulin Pen Needle (NOVOFINE) 30G X 8 MM MISC USE AS DIRECTED WITH SAXENDA 100 each 2   Insulin Pen Needle 31G X 8 MM MISC USE AS DIRECTED WITH SAXENDA 100 each 2   Lancets (FREESTYLE) lancets Use to check blood sugars 3 times a week DX: E11.65 150 each 3   Liraglutide -Weight Management (SAXENDA) 18 MG/3ML SOPN Inject 3 mg into the skin daily. 15 mL 1   metFORMIN (GLUCOPHAGE) 500 MG tablet Take 1 tablet (500 mg total) by mouth 2 (two) times daily with a meal. 60 tablet 2   Nutritional Supplements (ESTROVEN PO) Take 1 tablet by mouth daily.     omeprazole (PRILOSEC) 20 MG capsule Take 1 capsule (20 mg total) by mouth daily. 30 capsule 1   telmisartan-hydrochlorothiazide (MICARDIS HCT) 80-25 MG tablet Take 1 tablet by mouth daily.     vitamin B-12 (CYANOCOBALAMIN) 500 MCG tablet Take 500 mcg by mouth daily.     Ca Carbonate-Mag Hydroxide (ROLAIDS PO) Take 1 tablet by mouth as needed. (Patient not taking: No sig reported)     Current Facility-Administered Medications  Medication Dose Route Frequency Provider Last Rate Last Admin   0.9 %  sodium chloride infusion  500 mL Intravenous Continuous Sharyn Creamer, MD        Allergies as of 09/12/2021   (No Known Allergies)    Family History  Problem Relation Age of Onset   Kidney disease Mother    Hypertension Mother    Heart disease Father    Diabetes Father    Colon cancer Maternal Grandmother    Rectal cancer Neg Hx     Social History   Socioeconomic History   Marital status: Single    Spouse name: Not on file   Number of children: Not on file   Years of education: Not on file   Highest education level: Not on file  Occupational History   Not on file  Tobacco Use   Smoking status: Never   Smokeless tobacco: Never   Vaping Use   Vaping Use: Never used  Substance and Sexual Activity   Alcohol use: Yes    Comment: 1 drink per night   Drug use: Never   Sexual activity: Not on file  Other Topics Concern   Not on file  Social History Narrative   Not on file   Social Determinants of Health   Financial Resource Strain: Not on file  Food Insecurity: Not on file  Transportation Needs: Not on file  Physical Activity: Not on file  Stress: Not on file  Social Connections: Not on file  Intimate Partner Violence: Not on file  Physical Exam: Vital signs in last 24 hours: BP 113/75   Pulse 81   Temp (!) 96.6 F (35.9 C)   Ht _0  (1.676 m)   Wt 294 lb (133.4 kg)   SpO2 99%   BMI 47.45 kg/m  GEN: NAD EYE: Sclerae anicteric ENT: MMM CV: Non-tachycardic Pulm: CTA b/l GI: Soft NEURO:  Alert & Oriented   Christia Reading, MD [facility]   09/12/2021 9:22 AM

## 2021-09-12 NOTE — Op Note (Addendum)
[facility] Patient Name: Natasha [name] Procedure Date: 09/12/2021 9:30 AM MRN: [id number] Endoscopist: Nicole Kindred "Natasha [name] ,  Age: 55 Referring MD:  Date of Birth: February 11, 1966 Gender: Female Account #: [id number] Procedure:                Colonoscopy Indications:              Screening for colorectal malignant neoplasm,                            potential prior polyps Medicines:                Monitored Anesthesia Care Procedure:                After obtaining informed consent, the colonoscope                            was passed under direct vision. Throughout the                            procedure, the patient's blood pressure, pulse, and                            oxygen saturations were monitored continuously. The                            CF [id number] #[id number] was introduced through the anus                            with the intention of advancing to the cecum. The                            scope was advanced to the transverse colon before                            the procedure was aborted due to an inadequate                            preparation. Medications were given. The quality of                            the bowel preparation was poor. The patient                            tolerated the procedure well. Scope In: 9:35:17 AM Scope Out: 9:46:31 AM Total Procedure Duration: 0 hours 11 minutes 14 seconds  Findings:                 A large amount of semi-solid stool was found in the                            entire colon, interfering with visualization. Complications:            No immediate complications. Estimated Blood Loss:     Estimated blood loss: none. Impression:               -  Preparation of the colon was poor.                           - Stool in the entire examined colon.                           - No specimens collected. Recommendation:           - Discharge patient to home (with escort).                           - Repeat colonoscopy at the  next available                            appointment because the bowel preparation was poor.                            Will plan for Golytely bowel preparation at that                            time.                           - The findings and recommendations were discussed                            with the patient and/or patient's family. Nicole Kindred "Natasha [name],  09/12/2021 9:51:52 AM

## 2021-09-12 NOTE — Patient Instructions (Signed)
Handouts were given to your care partner on polyps, diverticulosis, and a high fiber diet with liberal fluid intake. ?You may resume your current medications today. ?Await biopsy results.  May take 1-3 weeks to receive pathology results. ?Please call if any questions or concerns. ?  ? ? ? ?YOU HAD AN ENDOSCOPIC PROCEDURE TODAY AT THE [facility]:   Refer to the procedure report that was given to you for any specific questions about what was found during the examination.  If the procedure report does not answer your questions, please call your gastroenterologist to clarify.  If you requested that your care partner not be given the details of your procedure findings, then the procedure report has been included in a sealed envelope for you to review at your convenience later. ? ?YOU SHOULD EXPECT: Some feelings of bloating in the abdomen. Passage of more gas than usual.  Walking can help get rid of the air that was put into your GI tract during the procedure and reduce the bloating. If you had a lower endoscopy (such as a colonoscopy or flexible sigmoidoscopy) you may notice spotting of blood in your stool or on the toilet paper. If you underwent a bowel prep for your procedure, you may not have a normal bowel movement for a few days. ? ?Please Note:  You might notice some irritation and congestion in your nose or some drainage.  This is from the oxygen used during your procedure.  There is no need for concern and it should clear up in a day or so. ? ?SYMPTOMS TO REPORT IMMEDIATELY: ? ?Following lower endoscopy (colonoscopy or flexible sigmoidoscopy): ? Excessive amounts of blood in the stool ? Significant tenderness or worsening of abdominal pains ? Swelling of the abdomen that is new, acute ? Fever of 100?F or higher ? ? ?For urgent or emergent issues, a gastroenterologist can be reached at any hour by calling [phone]. ?Do not use MyChart messaging for urgent concerns.  ? ? ?DIET:  We do recommend  a small meal at first, but then you may proceed to your regular diet.  Drink plenty of fluids but you should avoid alcoholic beverages for 24 hours. ? ?ACTIVITY:  You should plan to take it easy for the rest of today and you should NOT DRIVE or use heavy machinery until tomorrow (because of the sedation medicines used during the test).   ? ?FOLLOW UP: ?Our staff will call the number listed on your records 48-72 hours following your procedure to check on you and address any questions or concerns that you may have regarding the information given to you following your procedure. If we do not reach you, we will leave a message.  We will attempt to reach you two times.  During this call, we will ask if you have developed any symptoms of COVID 19. If you develop any symptoms (ie: fever, flu-like symptoms, shortness of breath, cough etc.) before then, please call [phone].  If you test positive for Covid 19 in the 2 weeks post procedure, please call and report this information to us.   ? ?If any biopsies were taken you will be contacted by phone or by letter within the next 1-3 weeks.  Please call us at [phone] if you have not heard about the biopsies in 3 weeks.  ? ? ?SIGNATURES/CONFIDENTIALITY: ?You and/or your care partner have signed paperwork which will be entered into your electronic medical record.  These signatures attest to the fact that   that the information above on your After Visit Summary has been reviewed and is understood.  Full responsibility of the confidentiality of this discharge information lies with you and/or your care-partner.  ?

## 2021-09-12 NOTE — Progress Notes (Signed)
VS- Natasha [name]. °

## 2021-09-12 NOTE — Progress Notes (Signed)
Pt was not cleaned out.  LEC colon rescheduled for 11/03/21 at 11:00 am arrive at 10:00 am.  Mathis Dad, RN rescheduled pt and printed her instructions for GOLYTELY.  I went over discharge instructions with pt and her prep instructions with her.  No problems noted in the recovery room. Maw

## 2021-09-12 NOTE — Progress Notes (Signed)
PT taken to PACU. Monitors in place. VSS. Report given to RN. 

## 2021-09-15 ENCOUNTER — Ambulatory Visit: Payer: 59 | Admitting: Emergency Medicine

## 2021-09-16 ENCOUNTER — Telehealth: Payer: Self-pay | Admitting: *Deleted

## 2021-09-16 NOTE — Telephone Encounter (Signed)
  Follow up Call-  Call back number 09/12/2021  Post procedure Call Back phone  # [phone]  Permission to leave phone message Yes     Patient questions:  Do you have a fever, pain , or abdominal swelling? No. Pain Score  0 *  Have you tolerated food without any problems? Yes.    Have you been able to return to your normal activities? Yes.    Do you have any questions about your discharge instructions: Diet   No. Medications  No. Follow up visit  No.  Do you have questions or concerns about your Care? No.  Actions: * If pain score is 4 or above: No action needed, pain <4.  Have you developed a fever since your procedure? no  2.   Have you had an respiratory symptoms (SOB or cough) since your procedure? no  3.   Have you tested positive for COVID 19 since your procedure no  4.   Have you had any family members/close contacts diagnosed with the COVID 19 since your procedure?  no   If yes to any of these questions please route to Laverna Peace, RN and Karlton Lemon, RN

## 2021-09-24 ENCOUNTER — Encounter: Payer: Self-pay | Admitting: Internal Medicine

## 2021-10-02 ENCOUNTER — Other Ambulatory Visit (HOSPITAL_BASED_OUTPATIENT_CLINIC_OR_DEPARTMENT_OTHER): Payer: Self-pay

## 2021-10-02 ENCOUNTER — Other Ambulatory Visit: Payer: Self-pay | Admitting: Nurse Practitioner

## 2021-10-02 MED FILL — Insulin Pen Needle 31 G X 8 MM (1/3" or 5/16"): 90 days supply | Qty: 100 | Fill #0 | Status: AC

## 2021-10-03 ENCOUNTER — Other Ambulatory Visit (HOSPITAL_BASED_OUTPATIENT_CLINIC_OR_DEPARTMENT_OTHER): Payer: Self-pay

## 2021-10-03 MED ORDER — METFORMIN HCL 500 MG PO TABS
500.0000 mg | ORAL_TABLET | Freq: Two times a day (BID) | ORAL | 2 refills | Status: DC
  Filled 2021-10-03: qty 60, 30d supply, fill #0
  Filled 2021-12-11 (×2): qty 60, 30d supply, fill #1
  Filled 2022-02-05 (×2): qty 60, 30d supply, fill #2

## 2021-10-30 ENCOUNTER — Encounter: Payer: Self-pay | Admitting: Internal Medicine

## 2021-11-02 ENCOUNTER — Encounter: Payer: Self-pay | Admitting: Certified Registered Nurse Anesthetist

## 2021-11-03 ENCOUNTER — Encounter: Payer: 59 | Admitting: Internal Medicine

## 2021-11-05 ENCOUNTER — Encounter: Payer: Self-pay | Admitting: Nurse Practitioner

## 2021-11-10 ENCOUNTER — Encounter: Payer: Self-pay | Admitting: Nurse Practitioner

## 2021-11-10 ENCOUNTER — Other Ambulatory Visit: Payer: Self-pay

## 2021-11-10 ENCOUNTER — Other Ambulatory Visit (HOSPITAL_BASED_OUTPATIENT_CLINIC_OR_DEPARTMENT_OTHER): Payer: Self-pay

## 2021-11-10 ENCOUNTER — Ambulatory Visit: Payer: 59 | Admitting: Nurse Practitioner

## 2021-11-10 VITALS — BP 124/80 | HR 98 | Temp 98.3°F | Ht 66.0 in | Wt 307.8 lb

## 2021-11-10 DIAGNOSIS — N951 Menopausal and female climacteric states: Secondary | ICD-10-CM | POA: Diagnosis not present

## 2021-11-10 DIAGNOSIS — Z6841 Body Mass Index (BMI) 40.0 and over, adult: Secondary | ICD-10-CM

## 2021-11-10 DIAGNOSIS — F338 Other recurrent depressive disorders: Secondary | ICD-10-CM

## 2021-11-10 MED ORDER — SAXENDA 18 MG/3ML ~~LOC~~ SOPN
3.0000 mg | PEN_INJECTOR | Freq: Every day | SUBCUTANEOUS | 1 refills | Status: DC
  Filled 2021-11-10: qty 15, 30d supply, fill #0
  Filled 2022-01-29: qty 15, 30d supply, fill #1

## 2021-11-10 MED ORDER — CITALOPRAM HYDROBROMIDE 10 MG PO TABS
10.0000 mg | ORAL_TABLET | Freq: Every day | ORAL | 2 refills | Status: DC
Start: 2021-11-10 — End: 2021-12-30
  Filled 2021-11-10: qty 30, 30d supply, fill #0

## 2021-11-10 MED ORDER — LORAZEPAM 0.5 MG PO TABS
0.5000 mg | ORAL_TABLET | Freq: Two times a day (BID) | ORAL | 0 refills | Status: DC
  Filled 2021-11-10: qty 20, 10d supply, fill #0

## 2021-11-10 NOTE — Progress Notes (Signed)
I,Yamilka J Llittleton,acting as a Education administrator for [employer], FNP.,have documented all relevant documentation on the behalf of [name], FNP,as directed by  [name], FNP while in the presence of [name], [place].   This visit occurred during the SARS-CoV-2 public health emergency.  Safety protocols were in place, including screening questions prior to the visit, additional usage of staff PPE, and extensive cleaning of exam room while observing appropriate contact time as indicated for disinfecting solutions.  Subjective:     Patient ID: Natasha [name] , female    DOB: 1966/09/15 , 55 y.o.   MRN: [id number]   Chief Complaint  Patient presents with   Weight Check   seasonal adjustment disorder    HPI  Patient presents today for a weight check. She also reports she has been having some seasonal depression since moving here from [place]. Does not want to go outside in the cold. She feels it is affecting her at work. She is now taking magnesium. She takes vitamin d daily.  In the past she took ativan for anxiety, when she gets stressed she would drink. She has been drinking more in recent weeks. She quit drinking on her own.   She has not been taking her Saxenda for the last week. She has restarted today. She has not been walking as much.    Wt Readings from Last 3 Encounters: 11/10/21 : (!) 307 lb 12.8 oz (139.6 kg) 09/12/21 : 294 lb (133.4 kg) 09/02/21 : 294 lb (133.4 kg)      Past Medical History:  Diagnosis Date   Colon polyps    Diabetes mellitus without complication ([place])    pre diabetes   GERD (gastroesophageal reflux disease)    Hypertension    Obesity      Family History  Problem Relation Age of Onset   Kidney disease Mother    Hypertension Mother    Heart disease Father    Diabetes Father    Colon cancer Maternal Grandmother      Current Outpatient Medications:    amLODipine (NORVASC) 5 MG tablet, Take 5 mg by mouth daily., Disp: , Rfl:    ASHWAGANDHA PO,  Take 1 tablet by mouth daily as needed., Disp: , Rfl:    Blood Glucose Monitoring Suppl (FREESTYLE LITE) w/Device KIT, 1 each by Does not apply route 3 (three) times daily. DX: E11.65, Disp: 1 kit, Rfl: 1   Blood Glucose Monitoring Suppl (FREESTYLE LITE) w/Device KIT, USE AS DIRECTED TO CHECK BLOOD SUGAR, Disp: 1 kit, Rfl: 1   Cholecalciferol (VITAMIN D) 125 MCG (5000 UT) CAPS, Take 1 capsule by mouth daily at 6 (six) AM., Disp: , Rfl:    citalopram (CELEXA) 10 MG tablet, Take 1 tablet (10 mg total) by mouth daily., Disp: 30 tablet, Rfl: 2   eszopiclone (LUNESTA) 2 MG TABS tablet, TAKE 1 TABLET (2 MG TOTAL) BY MOUTH AT BEDTIME. TAKE IMMEDIATELY BEFORE BEDTIME, Disp: 30 tablet, Rfl: 2   glucose blood test strip, USE TO CHECK BLOOD SUGARS 3 TIMES A WEEK DX: E11.65 (Patient taking differently: USE TO CHECK BLOOD SUGARS 3 TIMES A WEEK DX: E11.65), Disp: 150 strip, Rfl: 3   Insulin Pen Needle (NOVOFINE) 30G X 8 MM MISC, USE AS DIRECTED WITH SAXENDA, Disp: 100 each, Rfl: 2   Insulin Pen Needle 31G X 8 MM MISC, USE AS DIRECTED WITH SAXENDA, Disp: 100 each, Rfl: 2   Lancets (FREESTYLE) lancets, Use to check blood sugars 3 times a week DX:  E11.65, Disp: 150 each, Rfl: 3   LORazepam (ATIVAN) 0.5 MG tablet, Take 1 tablet (0.5 mg total) by mouth 2 (two) times daily., Disp: 20 tablet, Rfl: 0   Magnesium 250 MG TABS, Take 1 tablet by mouth at bedtime., Disp: , Rfl:    metFORMIN (GLUCOPHAGE) 500 MG tablet, Take 1 tablet (500 mg total) by mouth 2 (two) times daily with a meal., Disp: 60 tablet, Rfl: 2   Nutritional Supplements (ESTROVEN PO), Take 1 tablet by mouth daily., Disp: , Rfl:    omeprazole (PRILOSEC) 20 MG capsule, Take 1 capsule (20 mg total) by mouth daily., Disp: 30 capsule, Rfl: 1   telmisartan-hydrochlorothiazide (MICARDIS HCT) 80-25 MG tablet, Take 1 tablet by mouth daily., Disp: , Rfl:    thiamine (VITAMIN B-1) 100 MG tablet, Take 100 mg by mouth daily., Disp: , Rfl:    vitamin B-12 (CYANOCOBALAMIN)  500 MCG tablet, Take 500 mcg by mouth daily., Disp: , Rfl:    Zinc 30 MG TABS, Take 1 tablet by mouth daily at 12 noon., Disp: , Rfl:    Liraglutide -Weight Management (SAXENDA) 18 MG/3ML SOPN, Inject 3 mg into the skin daily., Disp: 15 mL, Rfl: 1   No Known Allergies   Review of Systems  Constitutional: Negative.   Respiratory: Negative.    Cardiovascular: Negative.  Negative for chest pain, palpitations and leg swelling.  Neurological: Negative.  Negative for dizziness and headaches.  Psychiatric/Behavioral:         Reports feeling sad and not wanting to do any activities    Today's Vitals   11/10/21 1603  BP: 124/80  Pulse: 98  Temp: 98.3 F (36.8 C)  Weight: (!) 307 lb 12.8 oz (139.6 kg)  Height: 5' 6"  (1.676 m)  PainSc: 0-No pain   Body mass index is 49.68 kg/m.   Objective:  Physical Exam Vitals reviewed.  Constitutional:      General: She is not in acute distress.    Appearance: Normal appearance. She is obese.  Cardiovascular:     Rate and Rhythm: Normal rate and regular rhythm.     Pulses: Normal pulses.     Heart sounds: Normal heart sounds. No murmur heard. Pulmonary:     Effort: Pulmonary effort is normal. No respiratory distress.     Breath sounds: Normal breath sounds. No wheezing.  Neurological:     General: No focal deficit present.     Mental Status: She is alert and oriented to person, place, and time.     Cranial Nerves: No cranial nerve deficit.     Motor: No weakness.  Psychiatric:        Attention and Perception: Attention and perception normal.        Mood and Affect: Mood is depressed.        Behavior: Behavior normal.        Thought Content: Thought content normal.        Cognition and Memory: Cognition and memory normal.        Judgment: Judgment normal.     Comments: She has a more "sad" mood today        Assessment And Plan:     1. Seasonal affective disorder ([place]) Comments: [place] - citalopram (CELEXA) 10 MG tablet;  Take 1 tablet (10 mg total) by mouth daily.  Dispense: 30 tablet; Refill: 2 - LORazepam (ATIVAN) 0.5 MG tablet; Take 1 tablet (0.5 mg total) by mouth 2 (two) times daily.  Dispense: 20 tablet; Refill:  0  2. Menopausal hot flushes Comments: I will refer to GYN for further evaluation to see what options she has, she has been on hormone therapy in the past - Ambulatory referral to Obstetrics / Gynecology  3. Class 3 severe obesity due to excess calories with body mass index (BMI) of 45.0 to 49.9 in adult, unspecified whether serious comorbidity present ([place]) Comments: Continued weight loss of 6 lbs Encouraged to change her exercising or diet more for persistent weight loss Continue Saxenda - Liraglutide -Weight Management (SAXENDA) 18 MG/3ML SOPN; Inject 3 mg into the skin daily.  Dispense: 15 mL; Refill: 1    Patient was given opportunity to ask questions. Patient verbalized understanding of the plan and was able to repeat key elements of the plan. All questions were answered to their satisfaction.  [name], FNP   I, [name], FNP, have reviewed all documentation for this visit. The documentation on 11/10/21 for the exam, diagnosis, procedures, and orders are all accurate and complete.   IF YOU HAVE BEEN REFERRED TO A SPECIALIST, IT MAY TAKE 1-2 WEEKS TO SCHEDULE/PROCESS THE REFERRAL. IF YOU HAVE NOT HEARD FROM US/SPECIALIST IN TWO WEEKS, PLEASE GIVE [place] A CALL AT [phone].   THE PATIENT IS ENCOURAGED TO PRACTICE SOCIAL DISTANCING DUE TO THE COVID-19 PANDEMIC.

## 2021-11-10 NOTE — Patient Instructions (Addendum)

## 2021-11-13 NOTE — Progress Notes (Signed)
Cardiology Office Note:    Date:  11/13/2021   ID:  Natasha [name], DOB 03/03/1966, MRN [id number]  PCP:  Arnette Felts, FNP   [facility] Providers Cardiologist:  None     Referring MD: Arnette Felts, FNP   No chief complaint on file. HTN  History of Present Illness:    Natasha [name] is a 55 y.o. female with a hx of obesity BMI 45, pre-DM2, referral for hypertension   She has hx of hypertension. She said her diastolic values were in the 90s. She has been on talmarsartan for years. For workman's comp she needs to see a cardiologist for hypertension. She did in [place]. She had an echo in the past, no CHF, no valve dx.  No hx of stroke. She denies angina, dyspnea on exertion, lower extremity edema, PND or orthopnea. Years ago she had a stress and it was normal. No LHC.  Blood pressures well controlled on current regimen. Norvasc 5 mg  and telmisartan-HCTz 80-25 mg.  She is working on [employer]. She has a Psychologist, educational.    CVD Risk/Equivalent: HLD- not checked HTN- yes PAD- no DMII - no Smoker-no   Family Hx: Both parents had HTN. 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     Allergies:   Patient has no known allergies.   Social History   Socioeconomic History   Marital status: Single    Spouse name: Not on file   Number of children: Not on file   Years of education: Not on file   Highest education level: Not on file  Occupational History   Not on file   Tobacco Use   Smoking status: Never   Smokeless tobacco: Never  Vaping Use   Vaping Use: Never used  Substance and Sexual Activity   Alcohol use: Yes    Comment: 1 drink per night   Drug use: Never   Sexual activity: Not on file  Other Topics Concern   Not on file  Social History Narrative   Not on file   Social Determinants of Health   Financial Resource Strain: Not on file  Food Insecurity: Not on file  Transportation Needs: Not on file  Physical Activity: Not on file  Stress: Not on file  Social Connections: Not on file     Family History: The patient's family history includes Colon cancer in her maternal grandmother; Diabetes in her father; Heart disease in her father; Hypertension in her mother; Kidney disease in her mother.  ROS:   Please see the history of present illness.     All other systems reviewed and are negative.  EKGs/Labs/Other Studies Reviewed:    The following studies were reviewed today:   EKG:  EKG is  ordered today.  The ekg ordered today demonstrates   NSR, 1st degree AV block PR 212 ms  Recent Labs: 01/24/2021: TSH 1.690 05/01/2021: ALT 11; BUN 11;  Creatinine, Ser 0.84; Potassium 4.2; Sodium 139  Recent Lipid Panel No results found for: CHOL, TRIG, HDL, CHOLHDL, VLDL, LDLCALC, LDLDIRECT   Risk Assessment/Calculations:           Physical Exam:    VS:  There were no vitals taken for this visit.    Wt Readings from Last 3 Encounters:  11/10/21 (!) 307 lb 12.8 oz (139.6 kg)  09/12/21 294 lb (133.4 kg)  09/02/21 294 lb (133.4 kg)     GEN: Obese, Well nourished, well developed in no acute distress HEENT: Normal NECK: No JVD; No carotid bruits LYMPHATICS: No lymphadenopathy CARDIAC: RRR, no murmurs, rubs, gallops RESPIRATORY:  Clear to auscultation without rales, wheezing or rhonchi  ABDOMEN: Soft, non-tender, non-distended MUSCULOSKELETAL:  No edema; No deformity  SKIN: Warm and dry NEUROLOGIC:  Alert and oriented x  3 PSYCHIATRIC:  Normal affect   ASSESSMENT:    #HTN:  Goal <130/80 mmhg. well controlled. She needs to see a cardiologist to refill her medications for workers comp. She can continue. We discussed weight loss and lifestyle. Recommend primary provider visit in January to check lipid profile.   PLAN:    In order of problems listed above:  Follow up 6 months      Medication Adjustments/Labs and Tests Ordered: Current medicines are reviewed at length with the patient today.  Concerns regarding medicines are outlined above.    Signed, Maisie Fus, MD  11/13/2021 6:24 PM     [facility]

## 2021-11-14 ENCOUNTER — Other Ambulatory Visit: Payer: Self-pay

## 2021-11-14 ENCOUNTER — Ambulatory Visit (INDEPENDENT_AMBULATORY_CARE_PROVIDER_SITE_OTHER): Payer: Worker's Compensation | Admitting: Internal Medicine

## 2021-11-14 ENCOUNTER — Other Ambulatory Visit (HOSPITAL_BASED_OUTPATIENT_CLINIC_OR_DEPARTMENT_OTHER): Payer: Self-pay

## 2021-11-14 ENCOUNTER — Encounter: Payer: Self-pay | Admitting: Internal Medicine

## 2021-11-14 VITALS — BP 117/86 | HR 83 | Ht 68.0 in | Wt 301.2 lb

## 2021-11-14 DIAGNOSIS — I10 Essential (primary) hypertension: Secondary | ICD-10-CM

## 2021-11-14 MED ORDER — TELMISARTAN-HCTZ 80-25 MG PO TABS
1.0000 | ORAL_TABLET | Freq: Every day | ORAL | 3 refills | Status: DC
Start: 2021-11-14 — End: 2022-12-16

## 2021-11-14 MED ORDER — AMLODIPINE BESYLATE 5 MG PO TABS
5.0000 mg | ORAL_TABLET | Freq: Every day | ORAL | 3 refills | Status: DC
  Filled 2021-11-14: qty 90, 90d supply, fill #0

## 2021-11-14 MED ORDER — AMLODIPINE BESYLATE 5 MG PO TABS: 5.0000 mg | ORAL_TABLET | Freq: Every day | ORAL | 3 refills | Status: DC

## 2021-11-14 MED ORDER — TELMISARTAN-HCTZ 80-25 MG PO TABS
1.0000 | ORAL_TABLET | Freq: Every day | ORAL | 3 refills | Status: DC
  Filled 2021-11-14: qty 90, 90d supply, fill #0

## 2021-11-14 NOTE — Addendum Note (Signed)
Addended by: Bea Laura B on: 11/14/2021 10:09 AM   Modules accepted: Orders

## 2021-11-14 NOTE — Patient Instructions (Signed)
Medication Instructions:  Refills sent for Cardiac Medications *If you need a refill on your cardiac medications before your next appointment, please call your pharmacy*  Follow-Up: At [facility], you and your health needs are our priority.  As part of our continuing mission to provide you with exceptional heart care, we have created designated Provider Care Teams.  These Care Teams include your primary Cardiologist (physician) and Advanced Practice Providers (APPs -  Physician Assistants and Nurse Practitioners) who all work together to provide you with the care you need, when you need it.  Your next appointment:   6 month(s)  The format for your next appointment:   In Person  Provider:   Maisie Fus, MD

## 2021-11-17 ENCOUNTER — Other Ambulatory Visit: Payer: Self-pay

## 2021-11-17 ENCOUNTER — Other Ambulatory Visit (HOSPITAL_BASED_OUTPATIENT_CLINIC_OR_DEPARTMENT_OTHER): Payer: Self-pay

## 2021-12-03 ENCOUNTER — Ambulatory Visit: Payer: 59 | Admitting: Nurse Practitioner

## 2021-12-10 ENCOUNTER — Ambulatory Visit: Payer: 59 | Admitting: Nurse Practitioner

## 2021-12-11 ENCOUNTER — Other Ambulatory Visit (HOSPITAL_BASED_OUTPATIENT_CLINIC_OR_DEPARTMENT_OTHER): Payer: Self-pay

## 2021-12-11 ENCOUNTER — Other Ambulatory Visit (HOSPITAL_COMMUNITY): Payer: Self-pay

## 2021-12-11 DIAGNOSIS — Z113 Encounter for screening for infections with a predominantly sexual mode of transmission: Secondary | ICD-10-CM | POA: Diagnosis not present

## 2021-12-11 DIAGNOSIS — Z01411 Encounter for gynecological examination (general) (routine) with abnormal findings: Secondary | ICD-10-CM | POA: Diagnosis not present

## 2021-12-11 DIAGNOSIS — Z01419 Encounter for gynecological examination (general) (routine) without abnormal findings: Secondary | ICD-10-CM | POA: Diagnosis not present

## 2021-12-11 DIAGNOSIS — F1020 Alcohol dependence, uncomplicated: Secondary | ICD-10-CM | POA: Diagnosis not present

## 2021-12-11 DIAGNOSIS — N951 Menopausal and female climacteric states: Secondary | ICD-10-CM | POA: Diagnosis not present

## 2021-12-11 DIAGNOSIS — Z6841 Body Mass Index (BMI) 40.0 and over, adult: Secondary | ICD-10-CM | POA: Diagnosis not present

## 2021-12-11 DIAGNOSIS — F339 Major depressive disorder, recurrent, unspecified: Secondary | ICD-10-CM | POA: Diagnosis not present

## 2021-12-11 LAB — HM PAP SMEAR

## 2021-12-11 MED ORDER — GABAPENTIN 300 MG PO CAPS
300.0000 mg | ORAL_CAPSULE | Freq: Every day | ORAL | 3 refills | Status: DC
  Filled 2021-12-11: qty 30, 30d supply, fill #0
  Filled 2022-01-08: qty 30, 30d supply, fill #1
  Filled 2022-02-05 (×2): qty 30, 30d supply, fill #2
  Filled 2022-07-19: qty 30, 30d supply, fill #3

## 2021-12-11 MED FILL — Lancets: 90 days supply | Qty: 100 | Fill #0 | Status: AC

## 2021-12-15 ENCOUNTER — Encounter: Payer: Self-pay | Admitting: Nurse Practitioner

## 2021-12-20 ENCOUNTER — Encounter: Payer: Self-pay | Admitting: Nurse Practitioner

## 2021-12-25 DIAGNOSIS — Z9189 Other specified personal risk factors, not elsewhere classified: Secondary | ICD-10-CM | POA: Diagnosis not present

## 2021-12-25 DIAGNOSIS — F1020 Alcohol dependence, uncomplicated: Secondary | ICD-10-CM | POA: Diagnosis not present

## 2021-12-25 DIAGNOSIS — Z1159 Encounter for screening for other viral diseases: Secondary | ICD-10-CM | POA: Diagnosis not present

## 2021-12-25 DIAGNOSIS — Z114 Encounter for screening for human immunodeficiency virus [HIV]: Secondary | ICD-10-CM | POA: Diagnosis not present

## 2021-12-26 ENCOUNTER — Other Ambulatory Visit (HOSPITAL_BASED_OUTPATIENT_CLINIC_OR_DEPARTMENT_OTHER): Payer: Self-pay

## 2021-12-29 DIAGNOSIS — F1020 Alcohol dependence, uncomplicated: Secondary | ICD-10-CM | POA: Diagnosis not present

## 2021-12-30 ENCOUNTER — Encounter: Payer: Self-pay | Admitting: Nurse Practitioner

## 2021-12-30 ENCOUNTER — Other Ambulatory Visit: Payer: Self-pay

## 2021-12-30 ENCOUNTER — Other Ambulatory Visit (HOSPITAL_BASED_OUTPATIENT_CLINIC_OR_DEPARTMENT_OTHER): Payer: Self-pay

## 2021-12-30 ENCOUNTER — Ambulatory Visit: Payer: 59 | Admitting: Nurse Practitioner

## 2021-12-30 VITALS — BP 118/80 | HR 85 | Temp 98.0°F | Ht 68.0 in | Wt 297.0 lb

## 2021-12-30 DIAGNOSIS — G4709 Other insomnia: Secondary | ICD-10-CM | POA: Diagnosis not present

## 2021-12-30 DIAGNOSIS — F338 Other recurrent depressive disorders: Secondary | ICD-10-CM | POA: Diagnosis not present

## 2021-12-30 DIAGNOSIS — R7309 Other abnormal glucose: Secondary | ICD-10-CM

## 2021-12-30 DIAGNOSIS — Z6841 Body Mass Index (BMI) 40.0 and over, adult: Secondary | ICD-10-CM

## 2021-12-30 MED ORDER — ESZOPICLONE 3 MG PO TABS
3.0000 mg | ORAL_TABLET | Freq: Every evening | ORAL | 2 refills | Status: DC | PRN
  Filled 2021-12-30: qty 30, 30d supply, fill #0
  Filled 2022-01-29: qty 30, 30d supply, fill #1
  Filled 2022-06-14: qty 20, 20d supply, fill #2
  Filled 2022-06-15: qty 10, 10d supply, fill #2

## 2021-12-30 NOTE — Patient Instructions (Signed)

## 2021-12-30 NOTE — Progress Notes (Signed)
I,Natasha [name],acting as a Education administrator for Natasha Brine, FNP.,have documented all relevant documentation on the behalf of Natasha Brine, FNP,as directed by  Natasha Brine, FNP while in the presence of Natasha [name], [place].   This visit occurred during the SARS-CoV-2 public health emergency.  Safety protocols were in place, including screening questions prior to the visit, additional usage of staff PPE, and extensive cleaning of exam room while observing appropriate contact time as indicated for disinfecting solutions.  Subjective:     Patient ID: Natasha [name] , female    DOB: [birth year] , 56 y.o.   MRN: [id number]   Chief Complaint  Patient presents with   Weight Check    HPI  Pt presents today for weight check. She would like to discuss further about medications remeron & saxenda.   Pt wants to know if she should take saxenda during the day rather than at night. - she is now back on Saxenda and had some nausea but she had also started taking naltrexone from the [facility] for her drinking. She is seeing them once a week. She has therapy with this.   She reports taking remeron at night around 11, since starting med she feels queasy and " foggy".    Starting weight: 02/27/2021 Starting date: 338 lbs Today's weight: 297 lbs Today's date: 12/30/2021  Total lbs lost to date: 39 lbs Total lbs lost since last in-office visit: 4 lbs  Wt Readings from Last 3 Encounters: 12/30/21 : 297 lb (134.7 kg) 11/14/21 : (!) 301 lb 3.2 oz (136.6 kg) 11/10/21 : (!) 307 lb 12.8 oz (139.6 kg)    Exercising: 2 days a week with personal trainer Water intake: she is drinking a little less than 64 oz water a day. Diet: regular tries to limit intake of high fat foods. She is not finishing a meal Medications: Saxenda titrating up at 1.8      Past Medical History:  Diagnosis Date   Colon polyps    Diabetes mellitus without complication ([place])    pre diabetes   GERD (gastroesophageal reflux disease)     Hypertension    Obesity      Family History  Problem Relation Age of Onset   Kidney disease Mother    Hypertension Mother    Heart disease Father    Diabetes Father    Colon cancer Maternal Grandmother      Current Outpatient Medications:    amLODipine (NORVASC) 5 MG tablet, Take 1 tablet (5 mg total) by mouth daily., Disp: 90 tablet, Rfl: 3   ASHWAGANDHA PO, Take 1 tablet by mouth daily as needed., Disp: , Rfl:    Blood Glucose Monitoring Suppl (FREESTYLE LITE) w/Device KIT, 1 each by Does not apply route 3 (three) times daily. DX: E11.65, Disp: 1 kit, Rfl: 1   Blood Glucose Monitoring Suppl (FREESTYLE LITE) w/Device KIT, USE AS DIRECTED TO CHECK BLOOD SUGAR, Disp: 1 kit, Rfl: 1   Cholecalciferol (VITAMIN D) 125 MCG (5000 UT) CAPS, Take 1 capsule by mouth daily at 6 (six) AM., Disp: , Rfl:    gabapentin (NEURONTIN) 300 MG capsule, Take 1 capsule (300 mg total) by mouth daily at bedtime., Disp: 30 capsule, Rfl: 3   glucose blood test strip, USE TO CHECK BLOOD SUGARS 3 TIMES A WEEK DX: E11.65 (Patient taking differently: USE TO CHECK BLOOD SUGARS 3 TIMES A WEEK DX: E11.65), Disp: 150 strip, Rfl: 3   Insulin Pen Needle (NOVOFINE) 30G X 8 MM MISC, USE  AS DIRECTED WITH SAXENDA, Disp: 100 each, Rfl: 2   Insulin Pen Needle 31G X 8 MM MISC, USE AS DIRECTED WITH SAXENDA, Disp: 100 each, Rfl: 2   Lancets (FREESTYLE) lancets, Use to check blood sugars 3 times a week DX: E11.65, Disp: 150 each, Rfl: 3   Lancets (FREESTYLE) lancets, USE TO CHECK BLOOD SUGARS 3 TIMES A WEEK, Disp: 100 each, Rfl: 3   Liraglutide -Weight Management (SAXENDA) 18 MG/3ML SOPN, Inject 3 mg into the skin daily., Disp: 15 mL, Rfl: 1   Magnesium 250 MG TABS, Take 1 tablet by mouth at bedtime., Disp: , Rfl:    metFORMIN (GLUCOPHAGE) 500 MG tablet, Take 1 tablet (500 mg total) by mouth 2 (two) times daily with a meal., Disp: 60 tablet, Rfl: 2   mirtazapine (REMERON) 15 MG tablet, Take 7.5 mg by mouth at bedtime as needed.,  Disp: , Rfl:    Nutritional Supplements (ESTROVEN PO), Take 1 tablet by mouth daily., Disp: , Rfl:    omeprazole (PRILOSEC) 20 MG capsule, Take 1 capsule (20 mg total) by mouth daily., Disp: 30 capsule, Rfl: 1   telmisartan-hydrochlorothiazide (MICARDIS HCT) 80-25 MG tablet, Take 1 tablet by mouth daily., Disp: 90 tablet, Rfl: 3   thiamine (VITAMIN B-1) 100 MG tablet, Take 100 mg by mouth daily., Disp: , Rfl:    vitamin B-12 (CYANOCOBALAMIN) 500 MCG tablet, Take 500 mcg by mouth daily., Disp: , Rfl:    Zinc 30 MG TABS, Take 1 tablet by mouth daily at 12 noon., Disp: , Rfl:    Eszopiclone 3 MG TABS, Take 1 tablet (3 mg total) by mouth at bedtime as needed for sleep., Disp: 30 tablet, Rfl: 2   LORazepam (ATIVAN) 0.5 MG tablet, Take 1 tablet (0.5 mg total) by mouth 2 (two) times daily. (Patient not taking: Reported on 12/30/2021), Disp: 20 tablet, Rfl: 0   No Known Allergies   Review of Systems  Constitutional: Negative.   Respiratory: Negative.    Cardiovascular: Negative.   Neurological: Negative.   Psychiatric/Behavioral: Negative.      Today's Vitals   12/30/21 1547  BP: 118/80  Pulse: 85  Temp: 98 F (36.7 C)  Weight: 297 lb (134.7 kg)  Height: 5' 8"  (1.727 m)   Body mass index is 45.16 kg/m.  Wt Readings from Last 3 Encounters:  12/30/21 297 lb (134.7 kg)  11/14/21 (!) 301 lb 3.2 oz (136.6 kg)  11/10/21 (!) 307 lb 12.8 oz (139.6 kg)    Objective:  Physical Exam Vitals reviewed.  Constitutional:      General: She is not in acute distress.    Appearance: Normal appearance. She is obese.  Cardiovascular:     Rate and Rhythm: Normal rate and regular rhythm.     Pulses: Normal pulses.     Heart sounds: Normal heart sounds. No murmur heard. Pulmonary:     Effort: Pulmonary effort is normal. No respiratory distress.     Breath sounds: Normal breath sounds. No wheezing.  Neurological:     General: No focal deficit present.     Mental Status: She is alert and oriented to  person, place, and time.     Cranial Nerves: No cranial nerve deficit.     Motor: No weakness.  Psychiatric:        Mood and Affect: Mood normal.        Behavior: Behavior normal.        Thought Content: Thought content normal.  Judgment: Judgment normal.        Assessment And Plan:     1. Other insomnia Comments: Continue Lunesta - Eszopiclone 3 MG TABS; Take 1 tablet (3 mg total) by mouth at bedtime as needed for sleep.  Dispense: 30 tablet; Refill: 2  2. Abnormal glucose - Hemoglobin A1c - CMP14+EGFR  3. Seasonal affective disorder (HCC) Comments: She did not take her citalopram wanted to try the sun lamp and did not want to add any new medications  4. Class 3 severe obesity due to excess calories with body mass index (BMI) of 45.0 to 49.9 in adult, unspecified whether serious comorbidity present [facility]) Comments: She has lost approximatel 4 lbs since December, continue Saxenda She is encouraged to strive for BMI less than 30 to decrease cardiac risk. Advised to aim for at least 150 minutes of exercise per week.     Patient was given opportunity to ask questions. Patient verbalized understanding of the plan and was able to repeat key elements of the plan. All questions were answered to their satisfaction.  Natasha Brine, FNP   I, Natasha Brine, FNP, have reviewed all documentation for this visit. The documentation on 12/30/21 for the exam, diagnosis, procedures, and orders are all accurate and complete.   IF YOU HAVE BEEN REFERRED TO A SPECIALIST, IT MAY TAKE 1-2 WEEKS TO SCHEDULE/PROCESS THE REFERRAL. IF YOU HAVE NOT HEARD FROM US/SPECIALIST IN TWO WEEKS, PLEASE GIVE [place] A CALL AT [phone].   THE PATIENT IS ENCOURAGED TO PRACTICE SOCIAL DISTANCING DUE TO THE COVID-19 PANDEMIC.

## 2021-12-31 LAB — CMP14+EGFR
ALT: 14 IU/L (ref 0–32)
AST: 22 IU/L (ref 0–40)
Albumin/Globulin Ratio: 1.4 (ref 1.2–2.2)
Albumin: 4.6 g/dL (ref 3.8–4.9)
Alkaline Phosphatase: 114 IU/L (ref 44–121)
BUN/Creatinine Ratio: 23 (ref 9–23)
BUN: 21 mg/dL (ref 6–24)
Bilirubin Total: 0.3 mg/dL (ref 0.0–1.2)
CO2: 25 mmol/L (ref 20–29)
Calcium: 10 mg/dL (ref 8.7–10.2)
Chloride: 100 mmol/L (ref 96–106)
Creatinine, Ser: 0.91 mg/dL (ref 0.57–1.00)
Globulin, Total: 3.2 g/dL (ref 1.5–4.5)
Glucose: 78 mg/dL (ref 70–99)
Potassium: 4.1 mmol/L (ref 3.5–5.2)
Sodium: 143 mmol/L (ref 134–144)
Total Protein: 7.8 g/dL (ref 6.0–8.5)
eGFR: 75 mL/min/{1.73_m2} (ref >59)

## 2021-12-31 LAB — HEMOGLOBIN A1C
Est. average glucose Bld gHb Est-mCnc: 120 mg/dL
Hgb A1c MFr Bld: 5.8 % — ABNORMAL HIGH (ref 4.8–5.6)

## 2022-01-02 DIAGNOSIS — F1020 Alcohol dependence, uncomplicated: Secondary | ICD-10-CM | POA: Diagnosis not present

## 2022-01-02 DIAGNOSIS — G4700 Insomnia, unspecified: Secondary | ICD-10-CM | POA: Diagnosis not present

## 2022-01-08 ENCOUNTER — Other Ambulatory Visit (HOSPITAL_BASED_OUTPATIENT_CLINIC_OR_DEPARTMENT_OTHER): Payer: Self-pay

## 2022-01-22 DIAGNOSIS — F1020 Alcohol dependence, uncomplicated: Secondary | ICD-10-CM | POA: Diagnosis not present

## 2022-01-29 ENCOUNTER — Other Ambulatory Visit (HOSPITAL_BASED_OUTPATIENT_CLINIC_OR_DEPARTMENT_OTHER): Payer: Self-pay

## 2022-02-01 ENCOUNTER — Encounter: Payer: Self-pay | Admitting: Nurse Practitioner

## 2022-02-05 ENCOUNTER — Other Ambulatory Visit (HOSPITAL_COMMUNITY): Payer: Self-pay

## 2022-02-05 ENCOUNTER — Other Ambulatory Visit (HOSPITAL_BASED_OUTPATIENT_CLINIC_OR_DEPARTMENT_OTHER): Payer: Self-pay

## 2022-02-09 ENCOUNTER — Other Ambulatory Visit (HOSPITAL_BASED_OUTPATIENT_CLINIC_OR_DEPARTMENT_OTHER): Payer: Self-pay

## 2022-02-09 MED ORDER — NALTREXONE HCL 50 MG PO TABS
50.0000 mg | ORAL_TABLET | ORAL | 0 refills | Status: DC
  Filled 2022-02-09: qty 30, 30d supply, fill #0

## 2022-03-02 ENCOUNTER — Other Ambulatory Visit (HOSPITAL_BASED_OUTPATIENT_CLINIC_OR_DEPARTMENT_OTHER): Payer: Self-pay

## 2022-03-02 ENCOUNTER — Encounter: Payer: Self-pay | Admitting: Nurse Practitioner

## 2022-03-02 ENCOUNTER — Ambulatory Visit: Payer: 59 | Admitting: Nurse Practitioner

## 2022-03-02 ENCOUNTER — Other Ambulatory Visit: Payer: Self-pay

## 2022-03-02 VITALS — BP 130/70 | HR 68 | Temp 97.7°F | Ht 68.0 in | Wt 294.2 lb

## 2022-03-02 DIAGNOSIS — Z6841 Body Mass Index (BMI) 40.0 and over, adult: Secondary | ICD-10-CM | POA: Diagnosis not present

## 2022-03-02 MED ORDER — WEGOVY 1 MG/0.5ML ~~LOC~~ SOAJ
1.0000 mg | SUBCUTANEOUS | 0 refills | Status: DC
  Filled 2022-03-02: qty 2, 28d supply, fill #0

## 2022-03-02 NOTE — Progress Notes (Signed)
?Industrial/product designer as a Education administrator for [employer], FNP.,have documented all relevant documentation on the behalf of [name], FNP,as directed by  [name], FNP while in the presence of [name], [place]. ? ?This visit occurred during the SARS-CoV-2 public health emergency.  Safety protocols were in place, including screening questions prior to the visit, additional usage of staff PPE, and extensive cleaning of exam room while observing appropriate contact time as indicated for disinfecting solutions. ? ?Subjective:  ?  ? Patient ID: Natasha [name] , female    DOB: 1966-10-26 , 56 y.o.   MRN: [id number] ? ? ?Chief Complaint  ?Patient presents with  ? Weight Check  ? ? ?HPI ? ?Patient is here for weight check.  ? ?Wt Readings from Last 3 Encounters: ?03/02/22 : 294 lb 3.2 oz (133.4 kg) ?12/30/21 : 297 lb (134.7 kg) ?11/14/21 : (!) 301 lb 3.2 oz (136.6 kg) ? ?She is back on Saxenda full dose since before January and feels like she is at a plateau ?She continues to see her personal trainer 2 times a week and 3 days a week at home will walking 2 miles.  ? ?She has a family history of colon cancer with her maternal grandmother.  ? ? ?  ? ?Past Medical History:  ?Diagnosis Date  ? Colon polyps   ? Diabetes mellitus without complication ([place])   ? pre diabetes  ? GERD (gastroesophageal reflux disease)   ? Hypertension   ? Obesity   ?  ? ?Family History  ?Problem Relation Age of Onset  ? Kidney disease Mother   ? Hypertension Mother   ? Heart disease Father   ? Diabetes Father   ? Colon cancer Maternal Grandmother   ? ? ? ?Current Outpatient Medications:  ?  amLODipine (NORVASC) 5 MG tablet, Take 1 tablet (5 mg total) by mouth daily., Disp: 90 tablet, Rfl: 3 ?  Blood Glucose Monitoring Suppl (FREESTYLE LITE) w/Device KIT, 1 each by Does not apply route 3 (three) times daily. DX: E11.65, Disp: 1 kit, Rfl: 1 ?  Blood Glucose Monitoring Suppl (FREESTYLE LITE) w/Device KIT, USE AS DIRECTED TO CHECK BLOOD SUGAR, Disp: 1  kit, Rfl: 1 ?  Cholecalciferol (VITAMIN D) 125 MCG (5000 UT) CAPS, Take 1 capsule by mouth daily at 6 (six) AM., Disp: , Rfl:  ?  Eszopiclone 3 MG TABS, Take 1 tablet (3 mg total) by mouth at bedtime as needed for sleep., Disp: 30 tablet, Rfl: 2 ?  gabapentin (NEURONTIN) 300 MG capsule, Take 1 capsule (300 mg total) by mouth daily at bedtime., Disp: 30 capsule, Rfl: 3 ?  glucose blood test strip, USE TO CHECK BLOOD SUGARS 3 TIMES A WEEK DX: E11.65 (Patient taking differently: USE TO CHECK BLOOD SUGARS 3 TIMES A WEEK DX: E11.65), Disp: 150 strip, Rfl: 3 ?  Insulin Pen Needle (NOVOFINE) 30G X 8 MM MISC, USE AS DIRECTED WITH SAXENDA, Disp: 100 each, Rfl: 2 ?  Insulin Pen Needle 31G X 8 MM MISC, USE AS DIRECTED WITH SAXENDA, Disp: 100 each, Rfl: 2 ?  Lancets (FREESTYLE) lancets, Use to check blood sugars 3 times a week DX: E11.65, Disp: 150 each, Rfl: 3 ?  Lancets (FREESTYLE) lancets, USE TO CHECK BLOOD SUGARS 3 TIMES A WEEK, Disp: 100 each, Rfl: 3 ?  Liraglutide -Weight Management (SAXENDA) 18 MG/3ML SOPN, Inject 3 mg into the skin daily., Disp: 15 mL, Rfl: 1 ?  LORazepam (ATIVAN) 0.5 MG tablet, Take 1 tablet (0.5 mg total)  by mouth 2 (two) times daily., Disp: 20 tablet, Rfl: 0 ?  Magnesium 250 MG TABS, Take 1 tablet by mouth at bedtime., Disp: , Rfl:  ?  metFORMIN (GLUCOPHAGE) 500 MG tablet, Take 1 tablet (500 mg total) by mouth 2 (two) times daily with a meal., Disp: 60 tablet, Rfl: 2 ?  mirtazapine (REMERON) 15 MG tablet, Take 7.5 mg by mouth at bedtime as needed., Disp: , Rfl:  ?  naltrexone (DEPADE) 50 MG tablet, Take 1 tablet (50 mg total) by mouth once daily., Disp: 30 tablet, Rfl: 0 ?  Nutritional Supplements (ESTROVEN PO), Take 1 tablet by mouth daily., Disp: , Rfl:  ?  telmisartan-hydrochlorothiazide (MICARDIS HCT) 80-25 MG tablet, Take 1 tablet by mouth daily., Disp: 90 tablet, Rfl: 3 ?  thiamine (VITAMIN B-1) 100 MG tablet, Take 100 mg by mouth daily., Disp: , Rfl:  ?  vitamin B-12 (CYANOCOBALAMIN) 500 MCG  tablet, Take 500 mcg by mouth daily., Disp: , Rfl:  ?  Zinc 30 MG TABS, Take 1 tablet by mouth daily at 12 noon., Disp: , Rfl:   ? ?No Known Allergies  ? ?Review of Systems  ?Constitutional: Negative.   ?Respiratory: Negative.    ?Cardiovascular: Negative.   ?Gastrointestinal: Negative.   ?Neurological: Negative.   ?Psychiatric/Behavioral: Negative.     ? ?Today's Vitals  ? 03/02/22 1514  ?BP: 130/70  ?Pulse: 68  ?Temp: 97.7 ?F (36.5 ?C)  ?TempSrc: Oral  ?Weight: 294 lb 3.2 oz (133.4 kg)  ?Height: 5' 8"  (1.727 m)  ? ?Body mass index is 44.73 kg/m?.  ? ?Objective:  ?Physical Exam ?Vitals reviewed.  ?Constitutional:   ?   General: She is not in acute distress. ?   Appearance: Normal appearance. She is obese.  ?Cardiovascular:  ?   Rate and Rhythm: Normal rate and regular rhythm.  ?   Pulses: Normal pulses.  ?   Heart sounds: No murmur heard. ?Pulmonary:  ?   Effort: Pulmonary effort is normal. No respiratory distress.  ?   Breath sounds: Normal breath sounds. No wheezing.  ?Neurological:  ?   General: No focal deficit present.  ?   Mental Status: She is alert and oriented to person, place, and time.  ?   Cranial Nerves: No cranial nerve deficit.  ?   Motor: No weakness.  ?Psychiatric:     ?   Mood and Affect: Mood normal.     ?   Behavior: Behavior normal.     ?   Thought Content: Thought content normal.     ?   Judgment: Judgment normal.  ?  ? ?   ?Assessment And Plan:  ?   ?1. Class 3 severe obesity due to excess calories with serious comorbidity and body mass index (BMI) of 45.0 to 49.9 in adult [facility]) ?Weight has decreased by 3 lbs, continue Saxenda and exercising regularly. Reminded she may be losing inches as well. May need to consider changing medication if insurance covers to [facility]. Discussed continuing to take her medication regularly for weight to avoid resistance ability to lose weight. ? ? ?Patient was given opportunity to ask questions. Patient verbalized understanding of the plan and was able to repeat  key elements of the plan. All questions were answered to their satisfaction.  ?[name], FNP  ? ?I, [name], FNP, have reviewed all documentation for this visit. The documentation on 03/02/22 for the exam, diagnosis, procedures, and orders are all accurate and complete.  ? ?IF YOU HAVE  BEEN REFERRED TO A SPECIALIST, IT MAY TAKE 1-2 WEEKS TO SCHEDULE/PROCESS THE REFERRAL. IF YOU HAVE NOT HEARD FROM US/SPECIALIST IN TWO WEEKS, PLEASE GIVE [place] A CALL AT [phone] X [phone].  ? ?THE PATIENT IS ENCOURAGED TO PRACTICE SOCIAL DISTANCING DUE TO THE COVID-19 PANDEMIC.   ?

## 2022-03-02 NOTE — Patient Instructions (Signed)
Obesity, Adult °Obesity is having too much body fat. Being obese means that your weight is more than what is healthy for you.  °BMI (body mass index) is a number that explains how much body fat you have. If you have a BMI of 30 or more, you are obese. °Obesity can cause serious health problems, such as: °Stroke. °Coronary artery disease (CAD). °Type 2 diabetes. °Some types of cancer. °High blood pressure (hypertension). °High cholesterol. °Gallbladder stones. °Obesity can also contribute to: °Osteoarthritis. °Sleep apnea. °Infertility problems. °What are the causes? °Eating meals each day that are high in calories, sugar, and fat. °Drinking a lot of drinks that have sugar in them. °Being born with genes that may make you more likely to become obese. °Having a medical condition that causes obesity. °Taking certain medicines. °Sitting a lot (having a sedentary lifestyle). °Not getting enough sleep. °What increases the risk? °Having a family history of obesity. °Living in an area with limited access to: °Parks, recreation centers, or sidewalks. °Healthy food choices, such as grocery stores and farmers' markets. °What are the signs or symptoms? °The main sign is having too much body fat. °How is this treated? °Treatment for this condition often includes changing your lifestyle. Treatment may include: °Changing your diet. This may include making a healthy meal plan. °Exercise. This may include activity that causes your heart to beat faster (aerobic exercise) and strength training. Work with your doctor to design a program that works for you. °Medicine to help you lose weight. This may be used if you are not able to lose one pound a week after 6 weeks of healthy eating and more exercise. °Treating conditions that cause the obesity. °Surgery. Options may include gastric banding and gastric bypass. This may be done if: °Other treatments have not helped to improve your condition. °You have a BMI of 40 or higher. °You have  life-threatening health problems related to obesity. °Follow these instructions at home: °Eating and drinking ° °Follow advice from your doctor about what to eat and drink. Your doctor may tell you to: °Limit fast food, sweets, and processed snack foods. °Choose low-fat options. For example, choose low-fat milk instead of whole milk. °Eat five or more servings of fruits or vegetables each day. °Eat at home more often. This gives you more control over what you eat. °Choose healthy foods when you eat out. °Learn to read food labels. This will help you learn how much food is in one serving. °Keep low-fat snacks available. °Avoid drinks that have a lot of sugar in them. These include soda, fruit juice, iced tea with sugar, and flavored milk. °Drink enough water to keep your pee (urine) pale yellow. °Do not go on fad diets. °Physical activity °Exercise often, as told by your doctor. Most adults should get up to 150 minutes of moderate-intensity exercise every week.Ask your doctor: °What types of exercise are safe for you. °How often you should exercise. °Warm up and stretch before being active. °Do slow stretching after being active (cool down). °Rest between times of being active. °Lifestyle °Work with your doctor and a food expert (dietitian) to set a weight-loss goal that is best for you. °Limit your screen time. °Find ways to reward yourself that do not involve food. °Do not drink alcohol if: °Your doctor tells you not to drink. °You are pregnant, may be pregnant, or are planning to become pregnant. °If you drink alcohol: °Limit how much you have to: °0-1 drink a day for women. °0-2 drinks   a day for men. °Know how much alcohol is in your drink. In the [place] one drink equals one 12 oz bottle of beer (355 mL), one 5 oz glass of wine (148 mL), or one 1½ oz glass of hard liquor (44 mL). °General instructions °Keep a weight-loss journal. This can help you keep track of: °The food that you eat. °How much exercise you  get. °Take over-the-counter and prescription medicines only as told by your doctor. °Take vitamins and supplements only as told by your doctor. °Think about joining a support group. °Pay attention to your mental health as obesity can lead to depression or self esteem issues. °Keep all follow-up visits. °Contact a doctor if: °You cannot meet your weight-loss goal after you have changed your diet and lifestyle for 6 weeks. °You are having trouble breathing. °Summary °Obesity is having too much body fat. °Being obese means that your weight is more than what is healthy for you. °Work with your doctor to set a weight-loss goal. °Get regular exercise as told by your doctor. °This information is not intended to replace advice given to you by your health care provider. Make sure you discuss any questions you have with your health care provider. °Document Revised: 07/01/2021 Document Reviewed: 07/01/2021 °Elsevier Patient Education © 2022 Elsevier Inc. ° °

## 2022-03-04 ENCOUNTER — Other Ambulatory Visit (HOSPITAL_BASED_OUTPATIENT_CLINIC_OR_DEPARTMENT_OTHER): Payer: Self-pay

## 2022-03-09 ENCOUNTER — Encounter: Payer: Self-pay | Admitting: Nurse Practitioner

## 2022-03-09 ENCOUNTER — Other Ambulatory Visit (HOSPITAL_BASED_OUTPATIENT_CLINIC_OR_DEPARTMENT_OTHER): Payer: Self-pay

## 2022-03-10 ENCOUNTER — Other Ambulatory Visit (HOSPITAL_BASED_OUTPATIENT_CLINIC_OR_DEPARTMENT_OTHER): Payer: Self-pay

## 2022-03-11 ENCOUNTER — Other Ambulatory Visit (HOSPITAL_BASED_OUTPATIENT_CLINIC_OR_DEPARTMENT_OTHER): Payer: Self-pay

## 2022-03-11 ENCOUNTER — Encounter (HOSPITAL_BASED_OUTPATIENT_CLINIC_OR_DEPARTMENT_OTHER): Payer: Self-pay

## 2022-03-11 DIAGNOSIS — F339 Major depressive disorder, recurrent, unspecified: Secondary | ICD-10-CM | POA: Diagnosis not present

## 2022-03-11 DIAGNOSIS — F1020 Alcohol dependence, uncomplicated: Secondary | ICD-10-CM | POA: Diagnosis not present

## 2022-03-11 DIAGNOSIS — N951 Menopausal and female climacteric states: Secondary | ICD-10-CM | POA: Diagnosis not present

## 2022-03-11 MED ORDER — GABAPENTIN 800 MG PO TABS
ORAL_TABLET | ORAL | 2 refills | Status: DC
  Filled 2022-03-11: qty 90, 90d supply, fill #0
  Filled 2022-06-14: qty 90, 90d supply, fill #1
  Filled 2022-10-05: qty 90, 90d supply, fill #2

## 2022-03-12 ENCOUNTER — Other Ambulatory Visit (HOSPITAL_BASED_OUTPATIENT_CLINIC_OR_DEPARTMENT_OTHER): Payer: Self-pay

## 2022-03-26 ENCOUNTER — Other Ambulatory Visit (HOSPITAL_BASED_OUTPATIENT_CLINIC_OR_DEPARTMENT_OTHER): Payer: Self-pay

## 2022-03-26 ENCOUNTER — Other Ambulatory Visit: Payer: Self-pay

## 2022-03-26 MED ORDER — WEGOVY 0.5 MG/0.5ML ~~LOC~~ SOAJ
0.5000 mg | SUBCUTANEOUS | 0 refills | Status: DC
  Filled 2022-03-26: qty 2, 28d supply, fill #0

## 2022-03-27 ENCOUNTER — Other Ambulatory Visit (HOSPITAL_BASED_OUTPATIENT_CLINIC_OR_DEPARTMENT_OTHER): Payer: Self-pay

## 2022-04-24 ENCOUNTER — Other Ambulatory Visit: Payer: Self-pay | Admitting: Nurse Practitioner

## 2022-04-24 ENCOUNTER — Other Ambulatory Visit (HOSPITAL_BASED_OUTPATIENT_CLINIC_OR_DEPARTMENT_OTHER): Payer: Self-pay

## 2022-04-25 MED ORDER — METFORMIN HCL 500 MG PO TABS
500.0000 mg | ORAL_TABLET | Freq: Two times a day (BID) | ORAL | 2 refills | Status: DC
  Filled 2022-04-25: qty 60, 30d supply, fill #0
  Filled 2022-06-19: qty 60, 30d supply, fill #1
  Filled 2022-07-19: qty 60, 30d supply, fill #2

## 2022-04-27 ENCOUNTER — Other Ambulatory Visit (HOSPITAL_BASED_OUTPATIENT_CLINIC_OR_DEPARTMENT_OTHER): Payer: Self-pay

## 2022-04-29 ENCOUNTER — Other Ambulatory Visit (HOSPITAL_BASED_OUTPATIENT_CLINIC_OR_DEPARTMENT_OTHER): Payer: Self-pay

## 2022-04-29 ENCOUNTER — Encounter: Payer: Self-pay | Admitting: Nurse Practitioner

## 2022-05-01 ENCOUNTER — Other Ambulatory Visit: Payer: Self-pay

## 2022-05-01 ENCOUNTER — Other Ambulatory Visit: Payer: Self-pay | Admitting: Nurse Practitioner

## 2022-05-01 ENCOUNTER — Other Ambulatory Visit (HOSPITAL_BASED_OUTPATIENT_CLINIC_OR_DEPARTMENT_OTHER): Payer: Self-pay

## 2022-05-01 MED ORDER — WEGOVY 1.7 MG/0.75ML ~~LOC~~ SOAJ
1.7000 mg | SUBCUTANEOUS | 0 refills | Status: DC
  Filled 2022-05-01: qty 3, 28d supply, fill #0

## 2022-05-14 ENCOUNTER — Ambulatory Visit: Payer: 59 | Admitting: Nurse Practitioner

## 2022-05-14 ENCOUNTER — Encounter: Payer: Self-pay | Admitting: Nurse Practitioner

## 2022-05-14 ENCOUNTER — Other Ambulatory Visit (HOSPITAL_BASED_OUTPATIENT_CLINIC_OR_DEPARTMENT_OTHER): Payer: Self-pay

## 2022-05-14 VITALS — BP 132/70 | HR 76 | Temp 98.2°F | Ht 68.0 in | Wt 277.0 lb

## 2022-05-14 DIAGNOSIS — R7309 Other abnormal glucose: Secondary | ICD-10-CM | POA: Diagnosis not present

## 2022-05-14 DIAGNOSIS — G4709 Other insomnia: Secondary | ICD-10-CM

## 2022-05-14 DIAGNOSIS — Z6841 Body Mass Index (BMI) 40.0 and over, adult: Secondary | ICD-10-CM | POA: Diagnosis not present

## 2022-05-14 DIAGNOSIS — I158 Other secondary hypertension: Secondary | ICD-10-CM

## 2022-05-14 MED ORDER — METFORMIN HCL 500 MG PO TABS: 500.0000 mg | ORAL_TABLET | Freq: Every day | ORAL | 1 refills | Status: DC

## 2022-05-14 MED ORDER — WEGOVY 2.4 MG/0.75ML ~~LOC~~ SOAJ
2.4000 mg | SUBCUTANEOUS | 1 refills | Status: DC
  Filled 2022-05-14: qty 3, 28d supply, fill #0

## 2022-05-14 MED ORDER — WEGOVY 1.7 MG/0.75ML ~~LOC~~ SOAJ
1.7000 mg | SUBCUTANEOUS | 0 refills | Status: DC
  Filled 2022-05-14 – 2022-06-14 (×4): qty 3, 28d supply, fill #0

## 2022-05-14 NOTE — Progress Notes (Signed)
I,Tianna Badgett,acting as a Education administrator for [employer], FNP.,have documented all relevant documentation on the behalf of [name], FNP,as directed by  [name], FNP while in the presence of [name], [place].  This visit occurred during the SARS-CoV-2 public health emergency.  Safety protocols were in place, including screening questions prior to the visit, additional usage of staff PPE, and extensive cleaning of exam room while observing appropriate contact time as indicated for disinfecting solutions.  Subjective:     Patient ID: Natasha [name] , female    DOB: [birth year] , 56 y.o.   MRN: [id number]   Chief Complaint  Patient presents with   Weight Check    HPI  Patient is here for weight check. She has been walking 2 miles on her break and exercising with her personal trainer. Her goal is to get to 230 lbs. She is taking Wegovy 1.7 and does not want to eat much. She will "nibble" mostly. She mostly eats meat or egg. She is trying to eat more vegetables. She is using a [employer] social [employer].   Wt Readings from Last 3 Encounters: 05/14/22 : 277 lb (125.6 kg) 03/02/22 : 294 lb 3.2 oz (133.4 kg) 12/30/21 : 297 lb (134.7 kg)       Past Medical History:  Diagnosis Date   Colon polyps    Diabetes mellitus without complication ([place])    pre diabetes   GERD (gastroesophageal reflux disease)    Hypertension    Obesity      Family History  Problem Relation Age of Onset   Kidney disease Mother    Hypertension Mother    Heart disease Father    Diabetes Father    Colon cancer Maternal Grandmother      Current Outpatient Medications:    amLODipine (NORVASC) 5 MG tablet, Take 1 tablet (5 mg total) by mouth daily., Disp: 90 tablet, Rfl: 3   Blood Glucose Monitoring Suppl (FREESTYLE LITE) w/Device KIT, 1 each by Does not apply route 3 (three) times daily. DX: E11.65, Disp: 1 kit, Rfl: 1   Cholecalciferol (VITAMIN D) 125 MCG (5000 UT) CAPS, Take 1 capsule by mouth daily at 6 (six) AM.,  Disp: , Rfl:    Eszopiclone 3 MG TABS, Take 1 tablet (3 mg total) by mouth at bedtime as needed for sleep., Disp: 30 tablet, Rfl: 2   gabapentin (NEURONTIN) 300 MG capsule, Take 1 capsule (300 mg total) by mouth daily at bedtime., Disp: 30 capsule, Rfl: 3   gabapentin (NEURONTIN) 800 MG tablet, Take 1 tablet every day by oral route at bedtime., Disp: 90 tablet, Rfl: 2   Insulin Pen Needle (NOVOFINE) 30G X 8 MM MISC, USE AS DIRECTED WITH SAXENDA, Disp: 100 each, Rfl: 2   Insulin Pen Needle 31G X 8 MM MISC, USE AS DIRECTED WITH SAXENDA, Disp: 100 each, Rfl: 2   Lancets (FREESTYLE) lancets, Use to check blood sugars 3 times a week DX: E11.65, Disp: 150 each, Rfl: 3   LORazepam (ATIVAN) 0.5 MG tablet, Take 1 tablet (0.5 mg total) by mouth 2 (two) times daily., Disp: 20 tablet, Rfl: 0   Magnesium 250 MG TABS, Take 1 tablet by mouth at bedtime., Disp: , Rfl:    metFORMIN (GLUCOPHAGE) 500 MG tablet, Take 1 tablet (500 mg total) by mouth daily with breakfast., Disp: 90 tablet, Rfl: 1   mirtazapine (REMERON) 15 MG tablet, Take 7.5 mg by mouth at bedtime as needed., Disp: , Rfl:    naltrexone (DEPADE) 50  MG tablet, Take 1 tablet (50 mg total) by mouth once daily., Disp: 30 tablet, Rfl: 0   Nutritional Supplements (ESTROVEN PO), Take 1 tablet by mouth daily., Disp: , Rfl:    Semaglutide-Weight Management (WEGOVY) 1 MG/0.5ML SOAJ, Inject 1 mg into the skin once a week., Disp: 2 mL, Rfl: 0   Semaglutide-Weight Management (WEGOVY) 1.7 MG/0.75ML SOAJ, Inject 1.7 mg into the skin once a week., Disp: 3 mL, Rfl: 0   telmisartan-hydrochlorothiazide (MICARDIS HCT) 80-25 MG tablet, Take 1 tablet by mouth daily., Disp: 90 tablet, Rfl: 3   thiamine (VITAMIN B-1) 100 MG tablet, Take 100 mg by mouth daily., Disp: , Rfl:    vitamin B-12 (CYANOCOBALAMIN) 500 MCG tablet, Take 500 mcg by mouth daily., Disp: , Rfl:    Zinc 30 MG TABS, Take 1 tablet by mouth daily at 12 noon., Disp: , Rfl:    No Known Allergies   Review of  Systems  Constitutional: Negative.   Respiratory: Negative.    Cardiovascular: Negative.   Gastrointestinal: Negative.   Neurological: Negative.   Psychiatric/Behavioral: Negative.       Today's Vitals   05/14/22 1145  BP: 132/70  Pulse: 76  Temp: 98.2 F (36.8 C)  TempSrc: Oral  Weight: 277 lb (125.6 kg)  Height: 5' 8" (1.727 m)   Body mass index is 42.12 kg/m.  Wt Readings from Last 3 Encounters:  05/14/22 277 lb (125.6 kg)  03/02/22 294 lb 3.2 oz (133.4 kg)  12/30/21 297 lb (134.7 kg)    Objective:  Physical Exam Vitals reviewed.  Constitutional:      General: She is not in acute distress.    Appearance: Normal appearance. She is obese.  Cardiovascular:     Rate and Rhythm: Normal rate and regular rhythm.     Pulses: Normal pulses.     Heart sounds: No murmur heard. Pulmonary:     Effort: Pulmonary effort is normal. No respiratory distress.     Breath sounds: Normal breath sounds. No wheezing.  Neurological:     General: No focal deficit present.     Mental Status: She is alert and oriented to person, place, and time.     Cranial Nerves: No cranial nerve deficit.     Motor: No weakness.  Psychiatric:        Mood and Affect: Mood normal.        Behavior: Behavior normal.        Thought Content: Thought content normal.        Judgment: Judgment normal.         Assessment And Plan:     1. Other secondary hypertension Comments: Blood pressure is controlled, continue current medications  2. Other insomnia Comments: She is doing better with her sleep, continue as needed sleep aid  3. Abnormal glucose Comments: Stable, continue eating a diet low in sugar and starches. Continue with regular exercise at least 4-5 times a day - Hemoglobin A1c - metFORMIN (GLUCOPHAGE) 500 MG tablet; Take 1 tablet (500 mg total) by mouth daily with breakfast.  Dispense: 90 tablet; Refill: 1  4. Class 3 severe obesity due to excess calories with serious comorbidity and body  mass index (BMI) of 40.0 to 44.9 in adult [facility]) Comments: Congratulated on her 17 lb weight loss since her last visit. Continue Wegovy, tolerating well. F/u in 8 weeks - Semaglutide-Weight Management (WEGOVY) 1.7 MG/0.75ML SOAJ; Inject 1.7 mg into the skin once a week.  Dispense: 3 mL; Refill: 0  Patient was given opportunity to ask questions. Patient verbalized understanding of the plan and was able to repeat key elements of the plan. All questions were answered to their satisfaction.  [name], FNP   I, [name], FNP, have reviewed all documentation for this visit. The documentation on 05/14/22 for the exam, diagnosis, procedures, and orders are all accurate and complete.   IF YOU HAVE BEEN REFERRED TO A SPECIALIST, IT MAY TAKE 1-2 WEEKS TO SCHEDULE/PROCESS THE REFERRAL. IF YOU HAVE NOT HEARD FROM US/SPECIALIST IN TWO WEEKS, PLEASE GIVE [place] A CALL AT [phone].   THE PATIENT IS ENCOURAGED TO PRACTICE SOCIAL DISTANCING DUE TO THE COVID-19 PANDEMIC.

## 2022-05-14 NOTE — Patient Instructions (Addendum)
Obesity, Adult ?Obesity is having too much body fat. Being obese means that your weight is more than what is healthy for you.  ?BMI (body mass index) is a number that explains how much body fat you have. If you have a BMI of 30 or more, you are obese. ?Obesity can cause serious health problems, such as: ?Stroke. ?Coronary artery disease (CAD). ?Type 2 diabetes. ?Some types of cancer. ?High blood pressure (hypertension). ?High cholesterol. ?Gallbladder stones. ?Obesity can also contribute to: ?Osteoarthritis. ?Sleep apnea. ?Infertility problems. ?What are the causes? ?Eating meals each day that are high in calories, sugar, and fat. ?Drinking a lot of drinks that have sugar in them. ?Being born with genes that may make you more likely to become obese. ?Having a medical condition that causes obesity. ?Taking certain medicines. ?Sitting a lot (having a sedentary lifestyle). ?Not getting enough sleep. ?What increases the risk? ?Having a family history of obesity. ?Living in an area with limited access to: ?Parks, recreation centers, or sidewalks. ?Healthy food choices, such as grocery stores and farmers' markets. ?What are the signs or symptoms? ?The main sign is having too much body fat. ?How is this treated? ?Treatment for this condition often includes changing your lifestyle. Treatment may include: ?Changing your diet. This may include making a healthy meal plan. ?Exercise. This may include activity that causes your heart to beat faster (aerobic exercise) and strength training. Work with your doctor to design a program that works for you. ?Medicine to help you lose weight. This may be used if you are not able to lose one pound a week after 6 weeks of healthy eating and more exercise. ?Treating conditions that cause the obesity. ?Surgery. Options may include gastric banding and gastric bypass. This may be done if: ?Other treatments have not helped to improve your condition. ?You have a BMI of 40 or higher. ?You have  life-threatening health problems related to obesity. ?Follow these instructions at home: ?Eating and drinking ? ?Follow advice from your doctor about what to eat and drink. Your doctor may tell you to: ?Limit fast food, sweets, and processed snack foods. ?Choose low-fat options. For example, choose low-fat milk instead of whole milk. ?Eat five or more servings of fruits or vegetables each day. ?Eat at home more often. This gives you more control over what you eat. ?Choose healthy foods when you eat out. ?Learn to read food labels. This will help you learn how much food is in one serving. ?Keep low-fat snacks available. ?Avoid drinks that have a lot of sugar in them. These include soda, fruit juice, iced tea with sugar, and flavored milk. ?Drink enough water to keep your pee (urine) pale yellow. ?Do not go on fad diets. ?Physical activity ?Exercise often, as told by your doctor. Most adults should get up to 150 minutes of moderate-intensity exercise every week.Ask your doctor: ?What types of exercise are safe for you. ?How often you should exercise. ?Warm up and stretch before being active. ?Do slow stretching after being active (cool down). ?Rest between times of being active. ?Lifestyle ?Work with your doctor and a food expert (dietitian) to set a weight-loss goal that is best for you. ?Limit your screen time. ?Find ways to reward yourself that do not involve food. ?Do not drink alcohol if: ?Your doctor tells you not to drink. ?You are pregnant, may be pregnant, or are planning to become pregnant. ?If you drink alcohol: ?Limit how much you have to: ?0-1 drink a day for women. ?0-2 drinks   a day for men. Know how much alcohol is in your drink. In the [place] one drink equals one 12 oz bottle of beer (355 mL), one 5 oz glass of wine (148 mL), or one 1 oz glass of hard liquor (44 mL). General instructions Keep a weight-loss journal. This can help you keep track of: The food that you eat. How much exercise you  get. Take over-the-counter and prescription medicines only as told by your doctor. Take vitamins and supplements only as told by your doctor. Think about joining a support group. Pay attention to your mental health as obesity can lead to depression or self esteem issues. Keep all follow-up visits. Contact a doctor if: You cannot meet your weight-loss goal after you have changed your diet and lifestyle for 6 weeks. You are having trouble breathing. Summary Obesity is having too much body fat. Being obese means that your weight is more than what is healthy for you. Work with your doctor to set a weight-loss goal. Get regular exercise as told by your doctor. This information is not intended to replace advice given to you by your health care provider. Make sure you discuss any questions you have with your health care provider. Document Revised: 07/01/2021 Document Reviewed: 07/01/2021 Elsevier Patient Education  2023 [employer].   Keep doing what you are doing. Congratulations on your 14 lb weight loss.

## 2022-05-15 ENCOUNTER — Other Ambulatory Visit (HOSPITAL_BASED_OUTPATIENT_CLINIC_OR_DEPARTMENT_OTHER): Payer: Self-pay

## 2022-05-25 ENCOUNTER — Other Ambulatory Visit (HOSPITAL_BASED_OUTPATIENT_CLINIC_OR_DEPARTMENT_OTHER): Payer: Self-pay

## 2022-05-25 ENCOUNTER — Other Ambulatory Visit: Payer: Self-pay

## 2022-05-25 ENCOUNTER — Encounter (HOSPITAL_BASED_OUTPATIENT_CLINIC_OR_DEPARTMENT_OTHER): Payer: Self-pay

## 2022-05-25 MED ORDER — WEGOVY 1 MG/0.5ML ~~LOC~~ SOAJ
1.0000 mg | SUBCUTANEOUS | 0 refills | Status: DC
  Filled 2022-05-25 – 2022-05-29 (×2): qty 2, 28d supply, fill #0

## 2022-05-26 ENCOUNTER — Other Ambulatory Visit (HOSPITAL_BASED_OUTPATIENT_CLINIC_OR_DEPARTMENT_OTHER): Payer: Self-pay

## 2022-05-28 ENCOUNTER — Other Ambulatory Visit (HOSPITAL_BASED_OUTPATIENT_CLINIC_OR_DEPARTMENT_OTHER): Payer: Self-pay

## 2022-05-29 ENCOUNTER — Other Ambulatory Visit (HOSPITAL_BASED_OUTPATIENT_CLINIC_OR_DEPARTMENT_OTHER): Payer: Self-pay

## 2022-06-14 ENCOUNTER — Other Ambulatory Visit: Payer: Self-pay | Admitting: Nurse Practitioner

## 2022-06-15 ENCOUNTER — Other Ambulatory Visit (HOSPITAL_BASED_OUTPATIENT_CLINIC_OR_DEPARTMENT_OTHER): Payer: Self-pay

## 2022-06-15 MED ORDER — WEGOVY 1 MG/0.5ML ~~LOC~~ SOAJ
1.0000 mg | SUBCUTANEOUS | 0 refills | Status: DC
  Filled 2022-06-15 – 2022-06-22 (×3): qty 2, 28d supply, fill #0

## 2022-06-15 MED ORDER — UNIFINE PENTIPS 31G X 8 MM MISC
2 refills | Status: DC
  Filled 2022-06-15: qty 100, 90d supply, fill #0

## 2022-06-18 ENCOUNTER — Other Ambulatory Visit (HOSPITAL_BASED_OUTPATIENT_CLINIC_OR_DEPARTMENT_OTHER): Payer: Self-pay

## 2022-06-19 ENCOUNTER — Other Ambulatory Visit (HOSPITAL_BASED_OUTPATIENT_CLINIC_OR_DEPARTMENT_OTHER): Payer: Self-pay

## 2022-06-22 ENCOUNTER — Other Ambulatory Visit (HOSPITAL_BASED_OUTPATIENT_CLINIC_OR_DEPARTMENT_OTHER): Payer: Self-pay

## 2022-07-20 ENCOUNTER — Other Ambulatory Visit (HOSPITAL_BASED_OUTPATIENT_CLINIC_OR_DEPARTMENT_OTHER): Payer: Self-pay

## 2022-07-23 ENCOUNTER — Ambulatory Visit: Payer: 59 | Admitting: Nurse Practitioner

## 2022-07-23 ENCOUNTER — Other Ambulatory Visit (HOSPITAL_BASED_OUTPATIENT_CLINIC_OR_DEPARTMENT_OTHER): Payer: Self-pay

## 2022-07-23 ENCOUNTER — Encounter: Payer: Self-pay | Admitting: Nurse Practitioner

## 2022-07-23 VITALS — BP 128/72 | HR 88 | Temp 98.4°F | Ht 68.0 in | Wt 274.0 lb

## 2022-07-23 DIAGNOSIS — E661 Drug-induced obesity: Secondary | ICD-10-CM

## 2022-07-23 DIAGNOSIS — I158 Other secondary hypertension: Secondary | ICD-10-CM

## 2022-07-23 DIAGNOSIS — R7309 Other abnormal glucose: Secondary | ICD-10-CM

## 2022-07-23 DIAGNOSIS — Z6841 Body Mass Index (BMI) 40.0 and over, adult: Secondary | ICD-10-CM | POA: Diagnosis not present

## 2022-07-23 MED ORDER — WEGOVY 1 MG/0.5ML ~~LOC~~ SOAJ
1.0000 mg | SUBCUTANEOUS | 1 refills | Status: DC
  Filled 2022-07-23: qty 2, 28d supply, fill #0
  Filled 2022-08-16: qty 2, 28d supply, fill #1

## 2022-07-23 NOTE — Progress Notes (Signed)
I,Tianna Badgett,acting as a Education administrator for [employer], FNP.,have documented all relevant documentation on the behalf of [name], FNP,as directed by  [name], FNP while in the presence of [name], [place].  Subjective:     Patient ID: Natasha [name] , female    DOB: [birth year] , 56 y.o.   MRN: [id number]   Chief Complaint  Patient presents with   Weight Check    HPI  Patient is here for weight check. She is currently on Wegovy 57m. She has not been walking as much due to the heat. She continues to go to her personal trainer - 2 days a week. She has been taking the stairs at work but has not had much help so she can not leave the unit. She was unable to take the 1.7 mg dose. She did take a week off due to not feeling well. She reports her pants are fitting more loosely.    Wt Readings from Last 3 Encounters: 07/23/22 : 274 lb (124.3 kg) 05/14/22 : 277 lb (125.6 kg) 03/02/22 : 294 lb 3.2 oz (133.4 kg)       Past Medical History:  Diagnosis Date   Colon polyps    [place]    pre diabetes   GERD (gastroesophageal reflux disease)    Hypertension    Obesity      Family History  Problem Relation Age of Onset   Kidney disease Mother    Hypertension Mother    Heart disease Father    Diabetes Father    Colon cancer Maternal Grandmother      Current Outpatient Medications:    amLODipine (NORVASC) 5 MG tablet, Take 1 tablet (5 mg total) by mouth daily., Disp: 90 tablet, Rfl: 3   Blood Glucose Monitoring Suppl (FREESTYLE LITE) w/Device KIT, 1 each by Does not apply route 3 (three) times daily. DX: E11.65, Disp: 1 kit, Rfl: 1   Cholecalciferol (VITAMIN D) 125 MCG (5000 UT) CAPS, Take 1 capsule by mouth daily at 6 (six) AM., Disp: , Rfl:    Eszopiclone 3 MG TABS, Take 1 tablet (3 mg total) by mouth at bedtime as needed for sleep., Disp: 30 tablet, Rfl: 2   gabapentin (NEURONTIN) 800 MG tablet, Take 1 tablet every day by oral route at  bedtime., Disp: 90 tablet, Rfl: 2   Insulin Pen Needle (NOVOFINE) 30G X 8 MM MISC, USE AS DIRECTED WITH SAXENDA, Disp: 100 each, Rfl: 2   Insulin Pen Needle (UNIFINE PENTIPS) 31G X 8 MM MISC, USE AS DIRECTED WITH SAXENDA, Disp: 100 each, Rfl: 2   Lancets (FREESTYLE) lancets, Use to check blood sugars 3 times a week DX: E11.65, Disp: 150 each, Rfl: 3   LORazepam (ATIVAN) 0.5 MG tablet, Take 1 tablet (0.5 mg total) by mouth 2 (two) times daily., Disp: 20 tablet, Rfl: 0   Magnesium 250 MG TABS, Take 1 tablet by mouth at bedtime., Disp: , Rfl:    metFORMIN (GLUCOPHAGE) 500 MG tablet, Take 1 tablet (500 mg total) by mouth 2 (two) times daily with a meal., Disp: 60 tablet, Rfl: 2   metFORMIN (GLUCOPHAGE) 500 MG tablet, Take 1 tablet (500 mg total) by mouth daily with breakfast., Disp: 90 tablet, Rfl: 1   mirtazapine (REMERON) 15 MG tablet, Take 7.5 mg by mouth at bedtime as needed., Disp: , Rfl:    naltrexone (DEPADE) 50 MG tablet, Take 1 tablet (50 mg total) by mouth once daily., Disp: 30 tablet, Rfl:  0   Nutritional Supplements (ESTROVEN PO), Take 1 tablet by mouth daily., Disp: , Rfl:    Semaglutide-Weight Management (WEGOVY) 1 MG/0.5ML SOAJ, Inject 1 mg into the skin once a week., Disp: 2 mL, Rfl: 1   telmisartan-hydrochlorothiazide (MICARDIS HCT) 80-25 MG tablet, Take 1 tablet by mouth daily., Disp: 90 tablet, Rfl: 3   thiamine (VITAMIN B-1) 100 MG tablet, Take 100 mg by mouth daily., Disp: , Rfl:    vitamin B-12 (CYANOCOBALAMIN) 500 MCG tablet, Take 500 mcg by mouth daily., Disp: , Rfl:    Zinc 30 MG TABS, Take 1 tablet by mouth daily at 12 noon., Disp: , Rfl:    No Known Allergies   Review of Systems  Constitutional: Negative.   Respiratory: Negative.    Cardiovascular: Negative.   Gastrointestinal: Negative.   Neurological: Negative.   Psychiatric/Behavioral: Negative.       Today's Vitals   07/23/22 1134  BP: 128/72  Pulse: 88  Temp: 98.4 F (36.9 C)  TempSrc: Oral  Weight: 274  lb (124.3 kg)  Height: _0  (1.727 m)   Body mass index is 41.66 kg/m.   Objective:  Physical Exam Vitals reviewed.  Constitutional:      General: She is not in acute distress.    Appearance: Normal appearance. She is obese.  Cardiovascular:     Rate and Rhythm: Normal rate and regular rhythm.     Pulses: Normal pulses.     Heart sounds: Normal heart sounds. No murmur heard. Neurological:     Mental Status: She is alert.         Assessment And Plan:     1. Other secondary hypertension Comments: Blood pressure is well controlled, continue current medications.   2. Abnormal glucose Comments: Stable, continue focusing on healthy diet low in sugar and starches.  - Hemoglobin A1c  3. Class 3 drug-induced obesity with serious comorbidity and body mass index (BMI) of 40.0 to 44.9 in adult [facility]) She is encouraged to strive for BMI less than 30 to decrease cardiac risk. Advised to aim for at least 150 minutes of exercise per week. Weight is declining gradually, continue Wegovy at 1 mg weekly this is tolerable for her  - Semaglutide-Weight Management (WEGOVY) 1 MG/0.5ML SOAJ; Inject 1 mg into the skin once a week.  Dispense: 2 mL; Refill: 1   Patient was given opportunity to ask questions. Patient verbalized understanding of the plan and was able to repeat key elements of the plan. All questions were answered to their satisfaction.  [name], FNP    I, [name], FNP, have reviewed all documentation for this visit. The documentation on 07/23/22 for the exam, diagnosis, procedures, and orders are all accurate and complete.   IF YOU HAVE BEEN REFERRED TO A SPECIALIST, IT MAY TAKE 1-2 WEEKS TO SCHEDULE/PROCESS THE REFERRAL. IF YOU HAVE NOT HEARD FROM US/SPECIALIST IN TWO WEEKS, PLEASE GIVE [place] A CALL AT [phone].   THE PATIENT IS ENCOURAGED TO PRACTICE SOCIAL DISTANCING DUE TO THE COVID-19 PANDEMIC.

## 2022-07-23 NOTE — Patient Instructions (Signed)
Obesity, Adult ?Obesity is having too much body fat. Being obese means that your weight is more than what is healthy for you.  ?BMI (body mass index) is a number that explains how much body fat you have. If you have a BMI of 30 or more, you are obese. ?Obesity can cause serious health problems, such as: ?Stroke. ?Coronary artery disease (CAD). ?Type 2 diabetes. ?Some types of cancer. ?High blood pressure (hypertension). ?High cholesterol. ?Gallbladder stones. ?Obesity can also contribute to: ?Osteoarthritis. ?Sleep apnea. ?Infertility problems. ?What are the causes? ?Eating meals each day that are high in calories, sugar, and fat. ?Drinking a lot of drinks that have sugar in them. ?Being born with genes that may make you more likely to become obese. ?Having a medical condition that causes obesity. ?Taking certain medicines. ?Sitting a lot (having a sedentary lifestyle). ?Not getting enough sleep. ?What increases the risk? ?Having a family history of obesity. ?Living in an area with limited access to: ?Parks, recreation centers, or sidewalks. ?Healthy food choices, such as grocery stores and farmers' markets. ?What are the signs or symptoms? ?The main sign is having too much body fat. ?How is this treated? ?Treatment for this condition often includes changing your lifestyle. Treatment may include: ?Changing your diet. This may include making a healthy meal plan. ?Exercise. This may include activity that causes your heart to beat faster (aerobic exercise) and strength training. Work with your doctor to design a program that works for you. ?Medicine to help you lose weight. This may be used if you are not able to lose one pound a week after 6 weeks of healthy eating and more exercise. ?Treating conditions that cause the obesity. ?Surgery. Options may include gastric banding and gastric bypass. This may be done if: ?Other treatments have not helped to improve your condition. ?You have a BMI of 40 or higher. ?You have  life-threatening health problems related to obesity. ?Follow these instructions at home: ?Eating and drinking ? ?Follow advice from your doctor about what to eat and drink. Your doctor may tell you to: ?Limit fast food, sweets, and processed snack foods. ?Choose low-fat options. For example, choose low-fat milk instead of whole milk. ?Eat five or more servings of fruits or vegetables each day. ?Eat at home more often. This gives you more control over what you eat. ?Choose healthy foods when you eat out. ?Learn to read food labels. This will help you learn how much food is in one serving. ?Keep low-fat snacks available. ?Avoid drinks that have a lot of sugar in them. These include soda, fruit juice, iced tea with sugar, and flavored milk. ?Drink enough water to keep your pee (urine) pale yellow. ?Do not go on fad diets. ?Physical activity ?Exercise often, as told by your doctor. Most adults should get up to 150 minutes of moderate-intensity exercise every week.Ask your doctor: ?What types of exercise are safe for you. ?How often you should exercise. ?Warm up and stretch before being active. ?Do slow stretching after being active (cool down). ?Rest between times of being active. ?Lifestyle ?Work with your doctor and a food expert (dietitian) to set a weight-loss goal that is best for you. ?Limit your screen time. ?Find ways to reward yourself that do not involve food. ?Do not drink alcohol if: ?Your doctor tells you not to drink. ?You are pregnant, may be pregnant, or are planning to become pregnant. ?If you drink alcohol: ?Limit how much you have to: ?0-1 drink a day for women. ?0-2 drinks   a day for men. ?Know how much alcohol is in your drink. In the [place] one drink equals one 12 oz bottle of beer (355 mL), one 5 oz glass of wine (148 mL), or one 1? oz glass of hard liquor (44 mL). ?General instructions ?Keep a weight-loss journal. This can help you keep track of: ?The food that you eat. ?How much exercise you  get. ?Take over-the-counter and prescription medicines only as told by your doctor. ?Take vitamins and supplements only as told by your doctor. ?Think about joining a support group. ?Pay attention to your mental health as obesity can lead to depression or self esteem issues. ?Keep all follow-up visits. ?Contact a doctor if: ?You cannot meet your weight-loss goal after you have changed your diet and lifestyle for 6 weeks. ?You are having trouble breathing. ?Summary ?Obesity is having too much body fat. ?Being obese means that your weight is more than what is healthy for you. ?Work with your doctor to set a weight-loss goal. ?Get regular exercise as told by your doctor. ?This information is not intended to replace advice given to you by your health care provider. Make sure you discuss any questions you have with your health care provider. ?Document Revised: 07/01/2021 Document Reviewed: 07/01/2021 ?Elsevier Patient Education ? 2023 Elsevier Inc. ? ?

## 2022-07-24 LAB — HEMOGLOBIN A1C
Est. average glucose Bld gHb Est-mCnc: 123 mg/dL
Hgb A1c MFr Bld: 5.9 % — ABNORMAL HIGH (ref 4.8–5.6)

## 2022-08-16 ENCOUNTER — Other Ambulatory Visit: Payer: Self-pay | Admitting: Nurse Practitioner

## 2022-08-16 MED ORDER — METFORMIN HCL 500 MG PO TABS
500.0000 mg | ORAL_TABLET | Freq: Two times a day (BID) | ORAL | 2 refills | Status: DC
  Filled 2022-08-16: qty 60, 30d supply, fill #0
  Filled 2022-10-05: qty 60, 30d supply, fill #1
  Filled 2022-11-01: qty 60, 30d supply, fill #2

## 2022-08-17 ENCOUNTER — Other Ambulatory Visit (HOSPITAL_BASED_OUTPATIENT_CLINIC_OR_DEPARTMENT_OTHER): Payer: Self-pay

## 2022-09-03 ENCOUNTER — Encounter: Payer: Self-pay | Admitting: Internal Medicine

## 2022-09-03 ENCOUNTER — Ambulatory Visit: Payer: 59 | Attending: Internal Medicine | Admitting: Internal Medicine

## 2022-09-03 VITALS — BP 106/80 | HR 92 | Ht 67.5 in | Wt 272.0 lb

## 2022-09-03 DIAGNOSIS — I10 Essential (primary) hypertension: Secondary | ICD-10-CM | POA: Diagnosis not present

## 2022-09-03 NOTE — Progress Notes (Signed)
Cardiology Office Note:    Date:  09/03/2022   ID:  Natasha [name], DOB 23-Dec-1965, MRN [id number]  PCP:  [name], FNP   [facility] Providers Cardiologist:  Janina Mayo, MD     Referring MD: [name], FNP   No chief complaint on file. HTN  History of Present Illness:    Natasha [name] is a 56 y.o. female with a hx of obesity BMI 45, pre-DM2, referral for hypertension   She has hx of hypertension. She said her diastolic values were in the 90s. She has been on talmarsartan for years. For workman's comp she needs to see a cardiologist for hypertension. She did in [place]. She had an echo in the past, no CHF, no valve dx.  No hx of stroke. She denies angina, dyspnea on exertion, lower extremity edema, PND or orthopnea. Years ago she had a stress and it was normal. No LHC.  Blood pressures well controlled on current regimen. Norvasc 5 mg  and telmisartan-HCTz 80-25 mg.  She is working on [employer]. She has a Clinical research associate.    CVD Risk/Equivalent: HLD- not checked HTN- yes PAD- no DMII - no Smoker-no   Family Hx: Both parents had HTN. 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 No limiting CP /SOB   Past Medical History:  Diagnosis Date   Colon polyps    Diabetes mellitus without complication ([place])    pre diabetes   GERD (gastroesophageal reflux disease)    Hypertension    Obesity     Past Surgical History:  Procedure Laterality Date   AUGMENTATION MAMMAPLASTY     GASTRIC BYPASS      Current Medications: No outpatient medications have been marked as taking for the 09/03/22 encounter (Appointment) with Janina Mayo, MD.     Allergies:   Patient has no known allergies.   Social History   Socioeconomic History   Marital status: Single    Spouse name: Not on file   Number of children: Not on file   Years of education: Not  on file   Highest education level: Not on file  Occupational History   Not on file  Tobacco Use   Smoking status: Never   Smokeless tobacco: Never  Vaping Use   Vaping Use: Never used  Substance and Sexual Activity   Alcohol use: Yes    Comment: 1 drink per night   Drug use: Never   Sexual activity: Not on file  Other Topics Concern   Not on file  Social History Narrative   Not on file   Social Determinants of Health   Financial Resource Strain: Not on file  Food Insecurity: Not on file  Transportation Needs: Not on file  Physical Activity: Not on file  Stress: Not on file  Social Connections: Not on file     Family History: The patient's family history includes Colon cancer in her maternal grandmother; Diabetes in her father; Heart disease in her father; Hypertension in her mother; Kidney disease in her mother.  ROS:   Please see the history of present illness.     All other systems reviewed and are negative.  EKGs/Labs/Other Studies Reviewed:    The following studies were reviewed today:   EKG:  EKG is  ordered today.  The ekg ordered today demonstrates   NSR, 1st  degree AV block PR 212 ms  Recent Labs: 12/30/2021: ALT 14; BUN 21; Creatinine, Ser 0.91; Potassium 4.1; Sodium 143   Recent Lipid Panel No results found for: "CHOL", "TRIG", "HDL", "CHOLHDL", "VLDL", "LDLCALC", "LDLDIRECT"   Risk Assessment/Calculations:           Physical Exam:    VS:  Vitals:   09/03/22 0855  BP: 106/80  Pulse: 92     Wt Readings from Last 3 Encounters:  07/23/22 274 lb (124.3 kg)  05/14/22 277 lb (125.6 kg)  03/02/22 294 lb 3.2 oz (133.4 kg)     GEN: Obese, Well nourished, well developed in no acute distress HEENT: Normal NECK: No JVD; No carotid bruits LYMPHATICS: No lymphadenopathy CARDIAC: RRR, no murmurs, rubs, gallops RESPIRATORY:  Clear to auscultation without rales, wheezing or rhonchi  ABDOMEN: Soft, non-tender, non-distended MUSCULOSKELETAL:  No  edema; No deformity  SKIN: Warm and dry NEUROLOGIC:  Alert and oriented x 3 PSYCHIATRIC:  Normal affect   ASSESSMENT:    #HTN:  Goal <130/80 mmhg. well controlled. She needs to see a cardiologist to refill her medications for workers comp. She can continue.   L shoulder pain: likely non cardiac  ASCVD on GLP 1 agonist. Pre-DM   PLAN:    In order of problems listed above:  Follow up 12 months      Medication Adjustments/Labs and Tests Ordered: Current medicines are reviewed at length with the patient today.  Concerns regarding medicines are outlined above.    Signed, Maisie Fus, MD  09/03/2022 8:12 AM    [facility]

## 2022-09-03 NOTE — Patient Instructions (Addendum)
Medication Instructions:  No changes  *If you need a refill on your cardiac medications before your next appointment, please call your pharmacy*   Lab Work: Not needed    Testing/Procedures: Not needed   Follow-Up: At [facility], you and your health needs are our priority.  As part of our continuing mission to provide you with exceptional heart care, we have created designated Provider Care Teams.  These Care Teams include your primary Cardiologist (physician) and Advanced Practice Providers (APPs -  Physician Assistants and Nurse Practitioners) who all work together to provide you with the care you need, when you need it.     Your next appointment:   12 month(s)  The format for your next appointment:   In Person  Provider:   Branch, Mary E, MD     

## 2022-09-17 ENCOUNTER — Encounter: Payer: Self-pay | Admitting: Nurse Practitioner

## 2022-09-17 ENCOUNTER — Ambulatory Visit: Payer: 59 | Admitting: Nurse Practitioner

## 2022-09-17 ENCOUNTER — Ambulatory Visit
Admission: RE | Admit: 2022-09-17 | Discharge: 2022-09-17 | Disposition: A | Discharge status: Home / Self Care | Payer: 59 | Source: Ambulatory Visit | Attending: Nurse Practitioner | Admitting: Nurse Practitioner

## 2022-09-17 ENCOUNTER — Other Ambulatory Visit: Payer: Self-pay | Admitting: Nurse Practitioner

## 2022-09-17 VITALS — BP 110/78 | HR 85 | Temp 98.2°F | Ht 67.0 in | Wt 270.0 lb

## 2022-09-17 DIAGNOSIS — Z1211 Encounter for screening for malignant neoplasm of colon: Secondary | ICD-10-CM | POA: Diagnosis not present

## 2022-09-17 DIAGNOSIS — Z6841 Body Mass Index (BMI) 40.0 and over, adult: Secondary | ICD-10-CM

## 2022-09-17 DIAGNOSIS — Z1231 Encounter for screening mammogram for malignant neoplasm of breast: Secondary | ICD-10-CM

## 2022-09-17 NOTE — Progress Notes (Signed)
I,Tianna Badgett,acting as a Education administrator for [employer], FNP.,have documented all relevant documentation on the behalf of [name], FNP,as directed by  [name], FNP while in the presence of [name], [place].  Subjective:     Patient ID: Natasha [name] , female    DOB: 05/03/1966 , 56 y.o.   MRN: [id number]   Chief Complaint  Patient presents with   Weight Loss    HPI  Patient presents today for weight. She continues to take Wegovy 52m, she took a week off. She feels like she has lost inches due to her clothes are not fitting the same. Continues to see a trainer for her exercise.   Wt Readings from Last 3 Encounters: 09/17/22 : 270 lb (122.5 kg) 09/03/22 : 272 lb (123.4 kg) 07/23/22 : 274 lb (124.3 kg)  She is eating about the same. She is limiting her intake of bread. She will sometimes drink a smoothie when she does not want to eat.       Past Medical History:  Diagnosis Date   Colon polyps    Diabetes mellitus without complication ([place]    pre diabetes   GERD (gastroesophageal reflux disease)    Hypertension    Obesity      Family History  Problem Relation Age of Onset   Kidney disease Mother    Hypertension Mother    Heart disease Father    Diabetes Father    Colon cancer Maternal Grandmother      Current Outpatient Medications:    amLODipine (NORVASC) 5 MG tablet, Take 1 tablet (5 mg total) by mouth daily., Disp: 90 tablet, Rfl: 3   Blood Glucose Monitoring Suppl (FREESTYLE LITE) w/Device KIT, 1 each by Does not apply route 3 (three) times daily. DX: E11.65, Disp: 1 kit, Rfl: 1   Cholecalciferol (VITAMIN D) 125 MCG (5000 UT) CAPS, Take 1 capsule by mouth daily at 6 (six) AM., Disp: , Rfl:    gabapentin (NEURONTIN) 800 MG tablet, Take 1 tablet every day by oral route at bedtime., Disp: 90 tablet, Rfl: 2   Insulin Pen Needle (NOVOFINE) 30G X 8 MM MISC, USE AS DIRECTED WITH SAXENDA, Disp: 100 each, Rfl: 2   Lancets (FREESTYLE) lancets, Use to check blood  sugars 3 times a week DX: E11.65, Disp: 150 each, Rfl: 3   LORazepam (ATIVAN) 0.5 MG tablet, Take 1 tablet (0.5 mg total) by mouth 2 (two) times daily., Disp: 20 tablet, Rfl: 0   metFORMIN (GLUCOPHAGE) 500 MG tablet, Take 1 tablet (500 mg total) by mouth daily with breakfast., Disp: 90 tablet, Rfl: 1   metFORMIN (GLUCOPHAGE) 500 MG tablet, Take 1 tablet (500 mg total) by mouth 2 (two) times daily with a meal., Disp: 60 tablet, Rfl: 2   Semaglutide-Weight Management (WEGOVY) 1 MG/0.5ML SOAJ, Inject 1 mg into the skin once a week., Disp: 2 mL, Rfl: 1   telmisartan-hydrochlorothiazide (MICARDIS HCT) 80-25 MG tablet, Take 1 tablet by mouth daily., Disp: 90 tablet, Rfl: 3   thiamine (VITAMIN B-1) 100 MG tablet, Take 100 mg by mouth daily., Disp: , Rfl:    vitamin B-12 (CYANOCOBALAMIN) 500 MCG tablet, Take 500 mcg by mouth daily., Disp: , Rfl:    Zinc 30 MG TABS, Take 1 tablet by mouth daily at 12 noon., Disp: , Rfl:    Eszopiclone 3 MG TABS, Take 1 tablet (3 mg total) by mouth at bedtime as needed for sleep., Disp: 30 tablet, Rfl: 2   Magnesium 250 MG  TABS, Take 1 tablet by mouth at bedtime. (Patient not taking: Reported on 09/17/2022), Disp: , Rfl:    No Known Allergies   Review of Systems  Constitutional: Negative.   Respiratory: Negative.    Cardiovascular: Negative.   Gastrointestinal: Negative.   Neurological: Negative.   Psychiatric/Behavioral: Negative.       Today's Vitals   09/17/22 1509  BP: 110/78  Pulse: 85  Temp: 98.2 F (36.8 C)  TempSrc: Oral  SpO2: 97%  Weight: 270 lb (122.5 kg)  Height: 5' 7"  (1.702 m)   Body mass index is 42.29 kg/m.   Objective:  Physical Exam Vitals reviewed.  Constitutional:      Appearance: Normal appearance.  Cardiovascular:     Rate and Rhythm: Normal rate and regular rhythm.     Pulses: Normal pulses.     Heart sounds: Normal heart sounds. No murmur heard. Pulmonary:     Effort: Pulmonary effort is normal. No respiratory distress.      Breath sounds: Normal breath sounds. No wheezing.  Skin:    General: Skin is warm and dry.     Capillary Refill: Capillary refill takes less than 2 seconds.  Neurological:     General: No focal deficit present.     Mental Status: She is alert and oriented to person, place, and time.     Cranial Nerves: No cranial nerve deficit.     Motor: No weakness.         Assessment And Plan:     1. Class 3 severe obesity due to excess calories with body mass index (BMI) of 45.0 to 49.9 in adult, unspecified whether serious comorbidity present ([place]) Comments: Weight is stable. Continue Wegovy and exercising regularly ASK- here for 2 month weight check  ASSESS - Body mass index is 42.29 kg/m., well tolerated, WAIST CIRCUMFERENCE 41.5 inches.  ADVISE  - aware of risk of cardiovascular disease currently has a diagnosis of hypertension, on medications. AGREE - continue to exercise with trainer  ASSIST - continue with Wegovy 1 mg doing well does not want to increase at this time  2. Colon cancer screening Comments: I highly recommend colonoscopy due to maternal grandmother had a type of digestive cancer however patient declines, will order cologuard and she is aware if has positive results she needs to see a GI specialist - Cologuard    Patient was given opportunity to ask questions. Patient verbalized understanding of the plan and was able to repeat key elements of the plan. All questions were answered to their satisfaction.  [name], FNP    I, [name], FNP, have reviewed all documentation for this visit. The documentation on 09/17/22 for the exam, diagnosis, procedures, and orders are all accurate and complete.  IF YOU HAVE BEEN REFERRED TO A SPECIALIST, IT MAY TAKE 1-2 WEEKS TO SCHEDULE/PROCESS THE REFERRAL. IF YOU HAVE NOT HEARD FROM US/SPECIALIST IN TWO WEEKS, PLEASE GIVE [place] A CALL AT [phone].   THE PATIENT IS ENCOURAGED TO PRACTICE SOCIAL DISTANCING DUE TO THE COVID-19  PANDEMIC.

## 2022-09-26 DIAGNOSIS — Z1211 Encounter for screening for malignant neoplasm of colon: Secondary | ICD-10-CM | POA: Diagnosis not present

## 2022-10-05 ENCOUNTER — Other Ambulatory Visit (HOSPITAL_BASED_OUTPATIENT_CLINIC_OR_DEPARTMENT_OTHER): Payer: Self-pay

## 2022-10-05 ENCOUNTER — Other Ambulatory Visit: Payer: Self-pay | Admitting: Nurse Practitioner

## 2022-10-05 DIAGNOSIS — E661 Drug-induced obesity: Secondary | ICD-10-CM

## 2022-10-05 LAB — COLOGUARD: COLOGUARD: NEGATIVE

## 2022-10-05 MED ORDER — WEGOVY 1 MG/0.5ML ~~LOC~~ SOAJ
1.0000 mg | SUBCUTANEOUS | 1 refills | Status: DC
  Filled 2022-10-05: qty 2, 28d supply, fill #0
  Filled 2022-11-01: qty 2, 28d supply, fill #1

## 2022-10-07 ENCOUNTER — Other Ambulatory Visit (HOSPITAL_BASED_OUTPATIENT_CLINIC_OR_DEPARTMENT_OTHER): Payer: Self-pay

## 2022-11-01 ENCOUNTER — Other Ambulatory Visit: Payer: Self-pay | Admitting: Nurse Practitioner

## 2022-11-01 DIAGNOSIS — F338 Other recurrent depressive disorders: Secondary | ICD-10-CM

## 2022-11-02 ENCOUNTER — Other Ambulatory Visit (HOSPITAL_BASED_OUTPATIENT_CLINIC_OR_DEPARTMENT_OTHER): Payer: Self-pay

## 2022-11-02 MED ORDER — LORAZEPAM 0.5 MG PO TABS
0.5000 mg | ORAL_TABLET | Freq: Two times a day (BID) | ORAL | 0 refills | Status: DC
  Filled 2022-11-02: qty 20, 10d supply, fill #0

## 2022-11-03 ENCOUNTER — Encounter: Payer: Self-pay | Admitting: Nurse Practitioner

## 2022-11-10 ENCOUNTER — Other Ambulatory Visit (HOSPITAL_BASED_OUTPATIENT_CLINIC_OR_DEPARTMENT_OTHER): Payer: Self-pay

## 2022-11-19 ENCOUNTER — Encounter: Payer: Self-pay | Admitting: Nurse Practitioner

## 2022-11-19 ENCOUNTER — Ambulatory Visit: Payer: 59 | Admitting: Nurse Practitioner

## 2022-11-19 ENCOUNTER — Other Ambulatory Visit (HOSPITAL_BASED_OUTPATIENT_CLINIC_OR_DEPARTMENT_OTHER): Payer: Self-pay

## 2022-11-19 VITALS — BP 128/72 | HR 99 | Temp 98.1°F | Ht 67.0 in | Wt 267.0 lb

## 2022-11-19 DIAGNOSIS — R051 Acute cough: Secondary | ICD-10-CM

## 2022-11-19 DIAGNOSIS — R7309 Other abnormal glucose: Secondary | ICD-10-CM | POA: Diagnosis not present

## 2022-11-19 DIAGNOSIS — K219 Gastro-esophageal reflux disease without esophagitis: Secondary | ICD-10-CM | POA: Diagnosis not present

## 2022-11-19 DIAGNOSIS — Z6841 Body Mass Index (BMI) 40.0 and over, adult: Secondary | ICD-10-CM | POA: Diagnosis not present

## 2022-11-19 DIAGNOSIS — I10 Essential (primary) hypertension: Secondary | ICD-10-CM | POA: Diagnosis not present

## 2022-11-19 DIAGNOSIS — I158 Other secondary hypertension: Secondary | ICD-10-CM

## 2022-11-19 DIAGNOSIS — E661 Drug-induced obesity: Secondary | ICD-10-CM | POA: Diagnosis not present

## 2022-11-19 MED ORDER — METFORMIN HCL 500 MG PO TABS
500.0000 mg | ORAL_TABLET | Freq: Two times a day (BID) | ORAL | 2 refills | Status: DC
  Filled 2022-11-19 – 2023-02-17 (×2): qty 60, 30d supply, fill #0
  Filled 2023-03-13 (×2): qty 60, 30d supply, fill #1
  Filled 2023-04-13 – 2023-09-02 (×2): qty 60, 30d supply, fill #2

## 2022-11-19 MED ORDER — BENZONATATE 100 MG PO CAPS
100.0000 mg | ORAL_CAPSULE | Freq: Four times a day (QID) | ORAL | 1 refills | Status: DC | PRN
  Filled 2022-11-19: qty 30, 8d supply, fill #0

## 2022-11-19 MED ORDER — AMLODIPINE BESYLATE 5 MG PO TABS
5.0000 mg | ORAL_TABLET | Freq: Every day | ORAL | 3 refills | Status: DC
  Filled 2022-11-19: qty 90, 90d supply, fill #0

## 2022-11-19 MED ORDER — OMEPRAZOLE 20 MG PO CPDR
20.0000 mg | DELAYED_RELEASE_CAPSULE | Freq: Every day | ORAL | 2 refills | Status: DC
  Filled 2022-11-19: qty 30, 30d supply, fill #0

## 2022-11-19 MED ORDER — WEGOVY 1 MG/0.5ML ~~LOC~~ SOAJ
1.0000 mg | SUBCUTANEOUS | 1 refills | Status: DC
  Filled 2022-11-19 – 2022-11-24 (×2): qty 2, 28d supply, fill #0
  Filled 2023-01-15: qty 2, 28d supply, fill #1

## 2022-11-19 NOTE — Progress Notes (Signed)
I,Tianna Badgett,acting as a Education administrator for [employer], FNP.,have documented all relevant documentation on the behalf of [name], FNP,as directed by  [name], FNP while in the presence of [name], [place].  Subjective:     Patient ID: Natasha [name] , female    DOB: [birth year] , 55 y.o.   MRN: [id number]   Chief Complaint  Patient presents with   Weight Check    HPI  Patient presents today for weight check. She took a 2 week break from [facility] due to not eating anything at all. She did not have any side effects when she started back. She feels like she is losing inches. She is currently stressed about the closing of her house. Diet - she is trying to eat a healthy diet.   She is getting over a bad cold and will have constant clearing of her throat when this happens, has not taken omeprazole.  Wt Readings from Last 3 Encounters: 11/19/22 : 267 lb (121.1 kg) 09/17/22 : 270 lb (122.5 kg) 09/03/22 : 272 lb (123.4 kg)       Past Medical History:  Diagnosis Date   Colon polyps    Diabetes mellitus without complication ([place])    pre diabetes   GERD (gastroesophageal reflux disease)    Hypertension    Obesity      Family History  Problem Relation Age of Onset   Kidney disease Mother    Hypertension Mother    Heart disease Father    Diabetes Father    Colon cancer Maternal Grandmother      Current Outpatient Medications:    albuterol (VENTOLIN HFA) 108 (90 Base) MCG/ACT inhaler, Inhale 2 puffs into the lungs every 6 (six) hours as needed for wheezing or shortness of breath., Disp: 6.7 g, Rfl: 2   benzonatate (TESSALON PERLES) 100 MG capsule, Take 1 capsule (100 mg total) by mouth every 6 (six) hours as needed., Disp: 30 capsule, Rfl: 1   Blood Glucose Monitoring Suppl (FREESTYLE LITE) w/Device KIT, 1 each by Does not apply route 3 (three) times daily. DX: E11.65, Disp: 1 kit, Rfl: 1   Cholecalciferol (VITAMIN D) 125 MCG (5000 UT) CAPS, Take 1 capsule by mouth daily at  6 (six) AM., Disp: , Rfl:    Eszopiclone 3 MG TABS, Take 1 tablet (3 mg total) by mouth at bedtime as needed for sleep., Disp: 30 tablet, Rfl: 2   gabapentin (NEURONTIN) 800 MG tablet, Take 1 tablet every day by oral route at bedtime., Disp: 90 tablet, Rfl: 2   Insulin Pen Needle (NOVOFINE) 30G X 8 MM MISC, USE AS DIRECTED WITH SAXENDA, Disp: 100 each, Rfl: 2   Lancets (FREESTYLE) lancets, Use to check blood sugars 3 times a week DX: E11.65, Disp: 150 each, Rfl: 3   LORazepam (ATIVAN) 0.5 MG tablet, Take 1 tablet (0.5 mg total) by mouth 2 (two) times daily., Disp: 20 tablet, Rfl: 0   Magnesium 250 MG TABS, Take 1 tablet by mouth at bedtime., Disp: , Rfl:    metFORMIN (GLUCOPHAGE) 500 MG tablet, Take 1 tablet (500 mg total) by mouth daily with breakfast., Disp: 90 tablet, Rfl: 1   omeprazole (PRILOSEC) 20 MG capsule, Take 1 capsule (20 mg total) by mouth daily., Disp: 30 capsule, Rfl: 2   telmisartan-hydrochlorothiazide (MICARDIS HCT) 80-25 MG tablet, Take 1 tablet by mouth daily., Disp: 90 tablet, Rfl: 3   thiamine (VITAMIN B-1) 100 MG tablet, Take 100 mg by mouth daily., Disp: , Rfl:  vitamin B-12 (CYANOCOBALAMIN) 500 MCG tablet, Take 500 mcg by mouth daily., Disp: , Rfl:    Zinc 30 MG TABS, Take 1 tablet by mouth daily at 12 noon., Disp: , Rfl:    amLODipine (NORVASC) 5 MG tablet, Take 1 tablet (5 mg total) by mouth daily., Disp: 90 tablet, Rfl: 3   amoxicillin-clavulanate (AUGMENTIN) 875-125 MG tablet, Take 1 tablet by mouth 2 (two) times daily., Disp: 20 tablet, Rfl: 0   metFORMIN (GLUCOPHAGE) 500 MG tablet, Take 1 tablet (500 mg total) by mouth 2 (two) times daily with a meal., Disp: 60 tablet, Rfl: 2   predniSONE (DELTASONE) 20 MG tablet, Take 2 tablets by mouth daily with breakfast., Disp: 10 tablet, Rfl: 0   promethazine-dextromethorphan (PROMETHAZINE-DM) 6.25-15 MG/5ML syrup, Take 5 mLs by mouth 3 (three) times daily as needed for cough., Disp: 100 mL, Rfl: 0   Semaglutide-Weight  Management (WEGOVY) 1 MG/0.5ML SOAJ, Inject 1 mg into the skin once a week., Disp: 2 mL, Rfl: 1   No Known Allergies   Review of Systems  Constitutional: Negative.   Respiratory: Negative.    Cardiovascular: Negative.   Gastrointestinal: Negative.   Neurological: Negative.   Psychiatric/Behavioral: Negative.       Today's Vitals   11/19/22 1534  BP: 128/72  Pulse: 99  Temp: 98.1 F (36.7 C)  TempSrc: Oral  Weight: 267 lb (121.1 kg)  Height: _0  (1.702 m)   Body mass index is 41.82 kg/m.  Wt Readings from Last 3 Encounters:  11/19/22 267 lb (121.1 kg)  09/17/22 270 lb (122.5 kg)  09/03/22 272 lb (123.4 kg)    Objective:  Physical Exam Vitals reviewed.  Constitutional:      Appearance: Normal appearance.  Cardiovascular:     Rate and Rhythm: Normal rate and regular rhythm.     Pulses: Normal pulses.     Heart sounds: Normal heart sounds. No murmur heard. Pulmonary:     Effort: Pulmonary effort is normal. No respiratory distress.     Breath sounds: Normal breath sounds. No wheezing.  Skin:    General: Skin is warm and dry.     Capillary Refill: Capillary refill takes less than 2 seconds.  Neurological:     General: No focal deficit present.     Mental Status: She is alert and oriented to person, place, and time.     Cranial Nerves: No cranial nerve deficit.     Motor: No weakness.         Assessment And Plan:     1. Abnormal glucose Comments: Continue focusing on a diet low in sugar and carbs.  Continue exercising at least 150 minutes/week - metFORMIN (GLUCOPHAGE) 500 MG tablet; Take 1 tablet (500 mg total) by mouth 2 (two) times daily with a meal.  Dispense: 60 tablet; Refill: 2 - Hemoglobin A1c  2. Other secondary hypertension Comments: Gets her medications with workers comp. Retired Designer, industrial/product.  Blood pressure is well-controlled - amLODipine (NORVASC) 5 MG tablet; Take 1 tablet (5 mg total) by mouth daily.  Dispense: 90 tablet; Refill: 3 -  BMP8+eGFR  3. Gastroesophageal reflux disease without esophagitis Comments: Encouraged to take probiotic daily.  Avoid triggers - omeprazole (PRILOSEC) 20 MG capsule; Take 1 capsule (20 mg total) by mouth daily.  Dispense: 30 capsule; Refill: 2  4. Acute cough - benzonatate (TESSALON PERLES) 100 MG capsule; Take 1 capsule (100 mg total) by mouth every 6 (six) hours as needed.  Dispense: 30 capsule; Refill: 1  5. Class 3 drug-induced obesity with serious comorbidity and body mass index (BMI) of 40.0 to 44.9 in adult [facility]) She is encouraged to strive for BMI less than 30 to decrease cardiac risk. Advised to aim for at least 150 minutes of exercise per week. - Semaglutide-Weight Management (WEGOVY) 1 MG/0.5ML SOAJ; Inject 1 mg into the skin once a week.  Dispense: 2 mL; Refill: 1   Patient was given opportunity to ask questions. Patient verbalized understanding of the plan and was able to repeat key elements of the plan. All questions were answered to their satisfaction.  [name], FNP   I, [name], FNP, have reviewed all documentation for this visit. The documentation on 11/19/22 for the exam, diagnosis, procedures, and orders are all accurate and complete.   IF YOU HAVE BEEN REFERRED TO A SPECIALIST, IT MAY TAKE 1-2 WEEKS TO SCHEDULE/PROCESS THE REFERRAL. IF YOU HAVE NOT HEARD FROM US/SPECIALIST IN TWO WEEKS, PLEASE GIVE [place] A CALL AT [phone].   THE PATIENT IS ENCOURAGED TO PRACTICE SOCIAL DISTANCING DUE TO THE COVID-19 PANDEMIC.

## 2022-11-19 NOTE — Patient Instructions (Signed)
Obesity, Adult ?Obesity is having too much body fat. Being obese means that your weight is more than what is healthy for you.  ?BMI (body mass index) is a number that explains how much body fat you have. If you have a BMI of 30 or more, you are obese. ?Obesity can cause serious health problems, such as: ?Stroke. ?Coronary artery disease (CAD). ?Type 2 diabetes. ?Some types of cancer. ?High blood pressure (hypertension). ?High cholesterol. ?Gallbladder stones. ?Obesity can also contribute to: ?Osteoarthritis. ?Sleep apnea. ?Infertility problems. ?What are the causes? ?Eating meals each day that are high in calories, sugar, and fat. ?Drinking a lot of drinks that have sugar in them. ?Being born with genes that may make you more likely to become obese. ?Having a medical condition that causes obesity. ?Taking certain medicines. ?Sitting a lot (having a sedentary lifestyle). ?Not getting enough sleep. ?What increases the risk? ?Having a family history of obesity. ?Living in an area with limited access to: ?Parks, recreation centers, or sidewalks. ?Healthy food choices, such as grocery stores and farmers' markets. ?What are the signs or symptoms? ?The main sign is having too much body fat. ?How is this treated? ?Treatment for this condition often includes changing your lifestyle. Treatment may include: ?Changing your diet. This may include making a healthy meal plan. ?Exercise. This may include activity that causes your heart to beat faster (aerobic exercise) and strength training. Work with your doctor to design a program that works for you. ?Medicine to help you lose weight. This may be used if you are not able to lose one pound a week after 6 weeks of healthy eating and more exercise. ?Treating conditions that cause the obesity. ?Surgery. Options may include gastric banding and gastric bypass. This may be done if: ?Other treatments have not helped to improve your condition. ?You have a BMI of 40 or higher. ?You have  life-threatening health problems related to obesity. ?Follow these instructions at home: ?Eating and drinking ? ?Follow advice from your doctor about what to eat and drink. Your doctor may tell you to: ?Limit fast food, sweets, and processed snack foods. ?Choose low-fat options. For example, choose low-fat milk instead of whole milk. ?Eat five or more servings of fruits or vegetables each day. ?Eat at home more often. This gives you more control over what you eat. ?Choose healthy foods when you eat out. ?Learn to read food labels. This will help you learn how much food is in one serving. ?Keep low-fat snacks available. ?Avoid drinks that have a lot of sugar in them. These include soda, fruit juice, iced tea with sugar, and flavored milk. ?Drink enough water to keep your pee (urine) pale yellow. ?Do not go on fad diets. ?Physical activity ?Exercise often, as told by your doctor. Most adults should get up to 150 minutes of moderate-intensity exercise every week.Ask your doctor: ?What types of exercise are safe for you. ?How often you should exercise. ?Warm up and stretch before being active. ?Do slow stretching after being active (cool down). ?Rest between times of being active. ?Lifestyle ?Work with your doctor and a food expert (dietitian) to set a weight-loss goal that is best for you. ?Limit your screen time. ?Find ways to reward yourself that do not involve food. ?Do not drink alcohol if: ?Your doctor tells you not to drink. ?You are pregnant, may be pregnant, or are planning to become pregnant. ?If you drink alcohol: ?Limit how much you have to: ?0-1 drink a day for women. ?0-2 drinks   a day for men. ?Know how much alcohol is in your drink. In the [place] one drink equals one 12 oz bottle of beer (355 mL), one 5 oz glass of wine (148 mL), or one 1? oz glass of hard liquor (44 mL). ?General instructions ?Keep a weight-loss journal. This can help you keep track of: ?The food that you eat. ?How much exercise you  get. ?Take over-the-counter and prescription medicines only as told by your doctor. ?Take vitamins and supplements only as told by your doctor. ?Think about joining a support group. ?Pay attention to your mental health as obesity can lead to depression or self esteem issues. ?Keep all follow-up visits. ?Contact a doctor if: ?You cannot meet your weight-loss goal after you have changed your diet and lifestyle for 6 weeks. ?You are having trouble breathing. ?Summary ?Obesity is having too much body fat. ?Being obese means that your weight is more than what is healthy for you. ?Work with your doctor to set a weight-loss goal. ?Get regular exercise as told by your doctor. ?This information is not intended to replace advice given to you by your health care provider. Make sure you discuss any questions you have with your health care provider. ?Document Revised: 07/01/2021 Document Reviewed: 07/01/2021 ?Elsevier Patient Education ? 2023 Elsevier Inc. ? ?

## 2022-11-20 ENCOUNTER — Other Ambulatory Visit (HOSPITAL_BASED_OUTPATIENT_CLINIC_OR_DEPARTMENT_OTHER): Payer: Self-pay

## 2022-11-20 LAB — HEMOGLOBIN A1C
Est. average glucose Bld gHb Est-mCnc: 126 mg/dL
Hgb A1c MFr Bld: 6 % — ABNORMAL HIGH (ref 4.8–5.6)

## 2022-11-20 LAB — BMP8+EGFR
BUN/Creatinine Ratio: 21 (ref 9–23)
BUN: 20 mg/dL (ref 6–24)
CO2: 26 mmol/L (ref 20–29)
Calcium: 10.3 mg/dL — ABNORMAL HIGH (ref 8.7–10.2)
Chloride: 100 mmol/L (ref 96–106)
Creatinine, Ser: 0.95 mg/dL (ref 0.57–1.00)
Glucose: 93 mg/dL (ref 70–99)
Potassium: 4.1 mmol/L (ref 3.5–5.2)
Sodium: 142 mmol/L (ref 134–144)
eGFR: 71 mL/min/{1.73_m2} (ref >59)

## 2022-11-24 ENCOUNTER — Other Ambulatory Visit: Payer: Self-pay

## 2022-11-24 ENCOUNTER — Other Ambulatory Visit: Payer: Self-pay | Admitting: Nurse Practitioner

## 2022-11-24 ENCOUNTER — Ambulatory Visit
Admission: EM | Admit: 2022-11-24 | Discharge: 2022-11-24 | Disposition: A | Discharge status: Home / Self Care | Payer: 59 | Attending: Urgent Care | Admitting: Urgent Care

## 2022-11-24 ENCOUNTER — Other Ambulatory Visit (HOSPITAL_BASED_OUTPATIENT_CLINIC_OR_DEPARTMENT_OTHER): Payer: Self-pay

## 2022-11-24 ENCOUNTER — Encounter: Payer: Self-pay | Admitting: Nurse Practitioner

## 2022-11-24 ENCOUNTER — Ambulatory Visit (INDEPENDENT_AMBULATORY_CARE_PROVIDER_SITE_OTHER): Payer: 59

## 2022-11-24 DIAGNOSIS — R059 Cough, unspecified: Secondary | ICD-10-CM | POA: Diagnosis not present

## 2022-11-24 DIAGNOSIS — J209 Acute bronchitis, unspecified: Secondary | ICD-10-CM

## 2022-11-24 DIAGNOSIS — E119 Type 2 diabetes mellitus without complications: Secondary | ICD-10-CM

## 2022-11-24 MED ORDER — PROMETHAZINE-DM 6.25-15 MG/5ML PO SYRP
5.0000 mL | ORAL_SOLUTION | Freq: Three times a day (TID) | ORAL | 0 refills | Status: DC | PRN
  Filled 2022-11-24: qty 100, 7d supply, fill #0

## 2022-11-24 MED ORDER — ALBUTEROL SULFATE HFA 108 (90 BASE) MCG/ACT IN AERS
2.0000 | INHALATION_SPRAY | Freq: Four times a day (QID) | RESPIRATORY_TRACT | 2 refills | Status: DC | PRN
  Filled 2022-11-24: qty 6.7, 25d supply, fill #0
  Filled 2022-12-23: qty 6.7, 25d supply, fill #1

## 2022-11-24 MED ORDER — PREDNISONE 20 MG PO TABS
ORAL_TABLET | ORAL | 0 refills | Status: DC
  Filled 2022-11-24: qty 10, 5d supply, fill #0

## 2022-11-24 NOTE — ED Provider Notes (Signed)
[facility]  Note:  This document was prepared using Systems analyst and may include unintentional dictation errors.  MRN: [id number] DOB: 12/03/1966  Subjective:   Natasha [name] is a 56 y.o. female presenting for 3 week history of persistent intermittently productive cough, difficulty with coughing fits. No history of asthma. No active sinus congestion, sinus drainage.   No current facility-administered medications for this encounter.  Current Outpatient Medications:    amLODipine (NORVASC) 5 MG tablet, Take 1 tablet (5 mg total) by mouth daily., Disp: 90 tablet, Rfl: 3   benzonatate (TESSALON PERLES) 100 MG capsule, Take 1 capsule (100 mg total) by mouth every 6 (six) hours as needed., Disp: 30 capsule, Rfl: 1   Blood Glucose Monitoring Suppl (FREESTYLE LITE) w/Device KIT, 1 each by Does not apply route 3 (three) times daily. DX: E11.65, Disp: 1 kit, Rfl: 1   Cholecalciferol (VITAMIN D) 125 MCG (5000 UT) CAPS, Take 1 capsule by mouth daily at 6 (six) AM., Disp: , Rfl:    Eszopiclone 3 MG TABS, Take 1 tablet (3 mg total) by mouth at bedtime as needed for sleep., Disp: 30 tablet, Rfl: 2   gabapentin (NEURONTIN) 800 MG tablet, Take 1 tablet every day by oral route at bedtime., Disp: 90 tablet, Rfl: 2   Insulin Pen Needle (NOVOFINE) 30G X 8 MM MISC, USE AS DIRECTED WITH SAXENDA, Disp: 100 each, Rfl: 2   Lancets (FREESTYLE) lancets, Use to check blood sugars 3 times a week DX: E11.65, Disp: 150 each, Rfl: 3   LORazepam (ATIVAN) 0.5 MG tablet, Take 1 tablet (0.5 mg total) by mouth 2 (two) times daily., Disp: 20 tablet, Rfl: 0   Magnesium 250 MG TABS, Take 1 tablet by mouth at bedtime., Disp: , Rfl:    metFORMIN (GLUCOPHAGE) 500 MG tablet, Take 1 tablet (500 mg total) by mouth daily with breakfast., Disp: 90 tablet, Rfl: 1   metFORMIN (GLUCOPHAGE) 500 MG tablet, Take 1 tablet (500 mg total) by mouth 2 (two) times daily with a meal., Disp: 60 tablet, Rfl:  2   omeprazole (PRILOSEC) 20 MG capsule, Take 1 capsule (20 mg total) by mouth daily., Disp: 30 capsule, Rfl: 2   Semaglutide-Weight Management (WEGOVY) 1 MG/0.5ML SOAJ, Inject 1 mg into the skin once a week., Disp: 2 mL, Rfl: 1   telmisartan-hydrochlorothiazide (MICARDIS HCT) 80-25 MG tablet, Take 1 tablet by mouth daily., Disp: 90 tablet, Rfl: 3   thiamine (VITAMIN B-1) 100 MG tablet, Take 100 mg by mouth daily., Disp: , Rfl:    vitamin B-12 (CYANOCOBALAMIN) 500 MCG tablet, Take 500 mcg by mouth daily., Disp: , Rfl:    Zinc 30 MG TABS, Take 1 tablet by mouth daily at 12 noon., Disp: , Rfl:    No Known Allergies  Past Medical History:  Diagnosis Date   Colon polyps    Diabetes mellitus without complication ([place])    pre diabetes   GERD (gastroesophageal reflux disease)    Hypertension    Obesity      Past Surgical History:  Procedure Laterality Date   AUGMENTATION MAMMAPLASTY     GASTRIC BYPASS      Family History  Problem Relation Age of Onset   Kidney disease Mother    Hypertension Mother    Heart disease Father    Diabetes Father    Colon cancer Maternal Grandmother     Social History   Tobacco Use   Smoking status: Never  Smokeless tobacco: Never  Vaping Use   Vaping Use: Never used  Substance Use Topics   Alcohol use: Yes    Comment: weekly   Drug use: Never    ROS   Objective:   Vitals: BP 124/77 (BP Location: Right Arm)   Pulse 99   Temp 99.1 F (37.3 C) (Oral)   Resp 17   SpO2 95%   Physical Exam Constitutional:      General: She is not in acute distress.    Appearance: Normal appearance. She is well-developed. She is not ill-appearing, toxic-appearing or diaphoretic.  HENT:     Head: Normocephalic and atraumatic.     Nose: Nose normal.     Mouth/Throat:     Mouth: Mucous membranes are moist.  Eyes:     General: No scleral icterus.       Right eye: No discharge.        Left eye: No discharge.     Extraocular Movements: Extraocular  movements intact.  Cardiovascular:     Rate and Rhythm: Normal rate and regular rhythm.     Heart sounds: Normal heart sounds. No murmur heard.    No friction rub. No gallop.  Pulmonary:     Effort: Pulmonary effort is normal. No respiratory distress.     Breath sounds: No stridor. Wheezing and rhonchi present. No rales.  Chest:     Chest wall: No tenderness.  Skin:    General: Skin is warm and dry.  Neurological:     General: No focal deficit present.     Mental Status: She is alert and oriented to person, place, and time.  Psychiatric:        Mood and Affect: Mood normal.        Behavior: Behavior normal.     DG Chest 2 View  Result Date: 11/24/2022 CLINICAL DATA:  persistent cough EXAM: CHEST - 2 VIEW COMPARISON:  None Available. FINDINGS: The heart size and mediastinal contours are within normal limits. Both lungs are clear. No visible pleural effusions or pneumothorax. No acute osseous abnormality. IMPRESSION: No active cardiopulmonary disease. Electronically Signed   By: Margaretha Sheffield M.D.   On: 11/24/2022 09:16     Assessment and Plan :   PDMP not reviewed this encounter.  1. Acute bronchitis, unspecified organism   2. Type 2 diabetes mellitus treated without insulin (HCC)     Recommend an oral prednisone course for her bronchitis. Will defer antibiotic use given negative chest x-ray. Use supportive care otherwise. Counseled patient on potential for adverse effects with medications prescribed/recommended today, ER and return-to-clinic precautions discussed, patient verbalized understanding.    Jaynee Eagles, [place] 11/24/22 [phone]

## 2022-11-24 NOTE — ED Triage Notes (Signed)
Pt c/o cough x 3 weeks-NAD-steady gait

## 2022-11-25 ENCOUNTER — Other Ambulatory Visit (HOSPITAL_BASED_OUTPATIENT_CLINIC_OR_DEPARTMENT_OTHER): Payer: Self-pay

## 2022-12-02 ENCOUNTER — Other Ambulatory Visit: Payer: Self-pay | Admitting: Nurse Practitioner

## 2022-12-02 ENCOUNTER — Other Ambulatory Visit (HOSPITAL_BASED_OUTPATIENT_CLINIC_OR_DEPARTMENT_OTHER): Payer: Self-pay

## 2022-12-02 MED ORDER — AMOXICILLIN-POT CLAVULANATE 875-125 MG PO TABS
1.0000 | ORAL_TABLET | Freq: Two times a day (BID) | ORAL | 0 refills | Status: DC
  Filled 2022-12-02: qty 20, 10d supply, fill #0

## 2022-12-06 DIAGNOSIS — E661 Drug-induced obesity: Secondary | ICD-10-CM | POA: Insufficient documentation

## 2022-12-06 DIAGNOSIS — K219 Gastro-esophageal reflux disease without esophagitis: Secondary | ICD-10-CM | POA: Insufficient documentation

## 2022-12-06 DIAGNOSIS — R7309 Other abnormal glucose: Secondary | ICD-10-CM | POA: Insufficient documentation

## 2022-12-06 DIAGNOSIS — I10 Essential (primary) hypertension: Secondary | ICD-10-CM | POA: Insufficient documentation

## 2022-12-16 ENCOUNTER — Telehealth: Payer: Self-pay | Admitting: Internal Medicine

## 2022-12-16 ENCOUNTER — Encounter: Payer: Self-pay | Admitting: Internal Medicine

## 2022-12-16 DIAGNOSIS — I158 Other secondary hypertension: Secondary | ICD-10-CM

## 2022-12-16 MED ORDER — AMLODIPINE BESYLATE 5 MG PO TABS: 5.0000 mg | ORAL_TABLET | Freq: Every day | ORAL | 3 refills | Status: DC

## 2022-12-16 MED ORDER — TELMISARTAN-HCTZ 80-25 MG PO TABS: 1.0000 | ORAL_TABLET | Freq: Every day | ORAL | 3 refills | Status: DC

## 2022-12-16 NOTE — Telephone Encounter (Signed)
*  STAT* If patient is at the pharmacy, call can be transferred to refill team.   1. Which medications need to be refilled? (please list name of each medication and dose if known)   amLODipine (NORVASC) 5 MG tablet  telmisartan-hydrochlorothiazide (MICARDIS HCT) 80-25 MG tablet   2. Which pharmacy/location (including street and city if local pharmacy) is medication to be sent to?  [id number] [place], [place] - [address]   3. Do they need a 30 day or 90 day supply? 90 day  Patient stated she is almost out of these medications.

## 2022-12-23 ENCOUNTER — Other Ambulatory Visit (HOSPITAL_BASED_OUTPATIENT_CLINIC_OR_DEPARTMENT_OTHER): Payer: Self-pay

## 2022-12-24 ENCOUNTER — Other Ambulatory Visit (HOSPITAL_BASED_OUTPATIENT_CLINIC_OR_DEPARTMENT_OTHER): Payer: Self-pay

## 2022-12-24 MED ORDER — GABAPENTIN 800 MG PO TABS
800.0000 mg | ORAL_TABLET | Freq: Every evening | ORAL | 1 refills | Status: DC
  Filled 2022-12-24: qty 90, 90d supply, fill #0
  Filled 2023-03-13 (×2): qty 90, 90d supply, fill #1

## 2023-01-21 ENCOUNTER — Ambulatory Visit: Payer: Self-pay | Admitting: Nurse Practitioner

## 2023-02-09 IMAGING — MG DIGITAL SCREENING BREAST BILAT IMPLANT W/ TOMO W/ CAD
8 of 14 series · 8 of 34 positions shown · non-contrast
Comparison: Previous exam(s).

CLINICAL DATA: Screening.

EXAM:
DIGITAL SCREENING BILATERAL MAMMOGRAM WITH IMPLANTS, CAD AND
TOMOSYNTHESIS
TECHNIQUE: Bilateral screening digital craniocaudal and mediolateral oblique
mammograms were obtained. Bilateral screening digital breast
tomosynthesis was performed. The images were evaluated with
computer-aided detection. Standard and/or implant displaced views
were performed.

[L MLO]
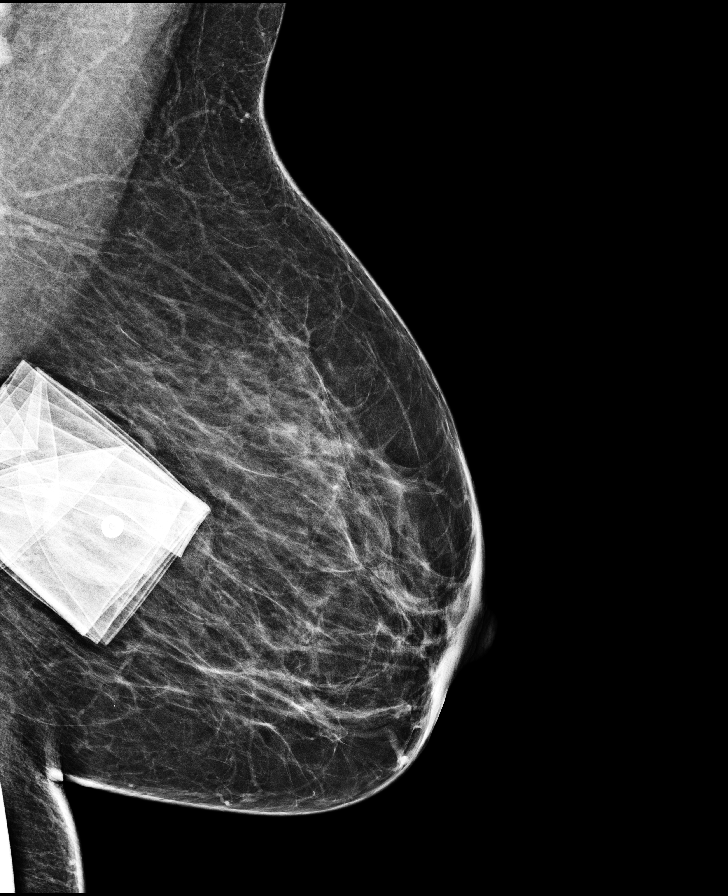

[R MLO]
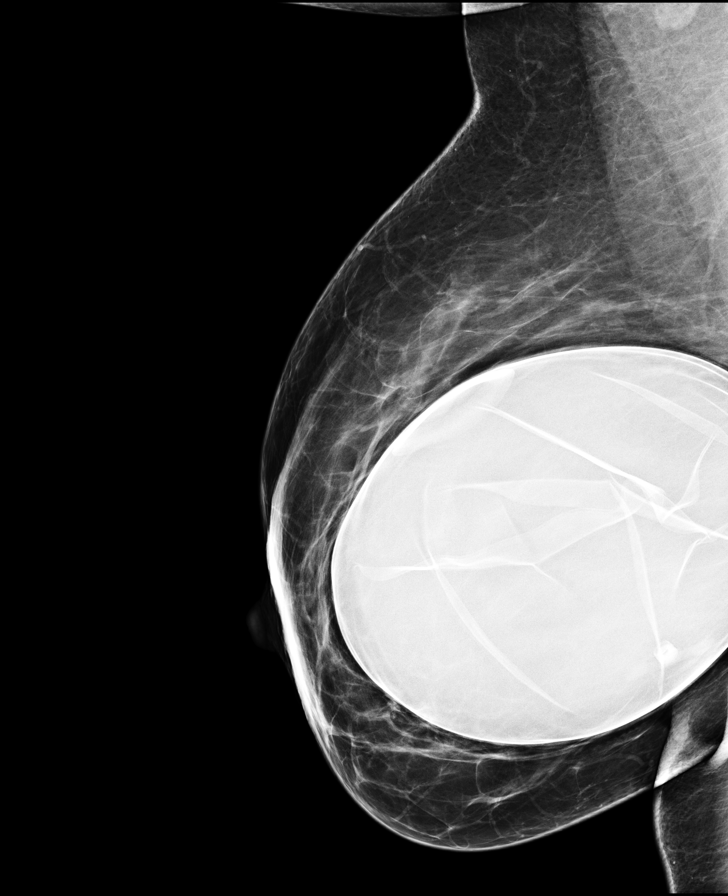

[L CC]
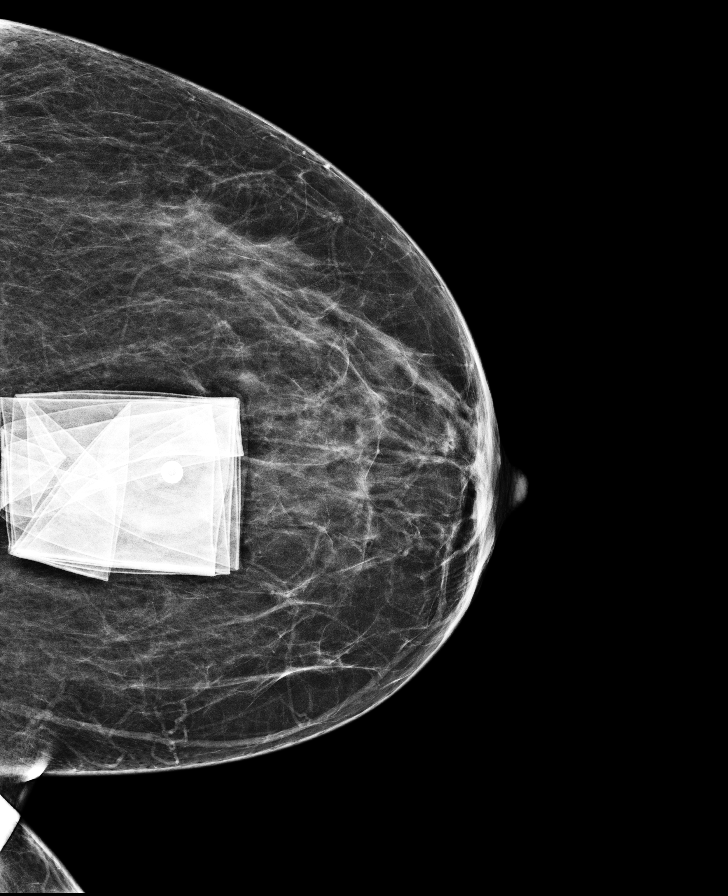

[R CC]
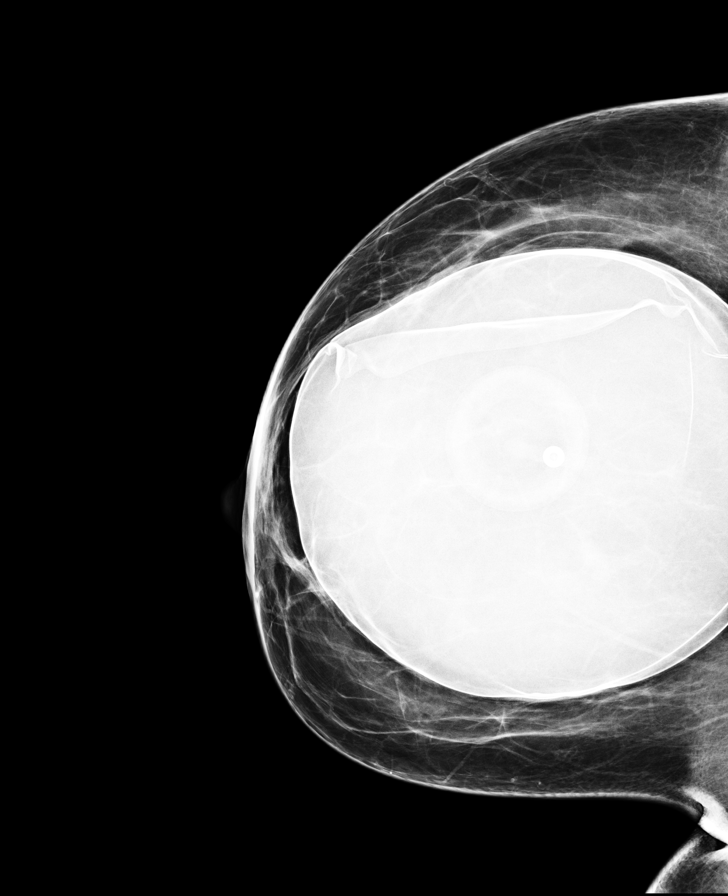

[R CC synth-2D (1 of 2)]
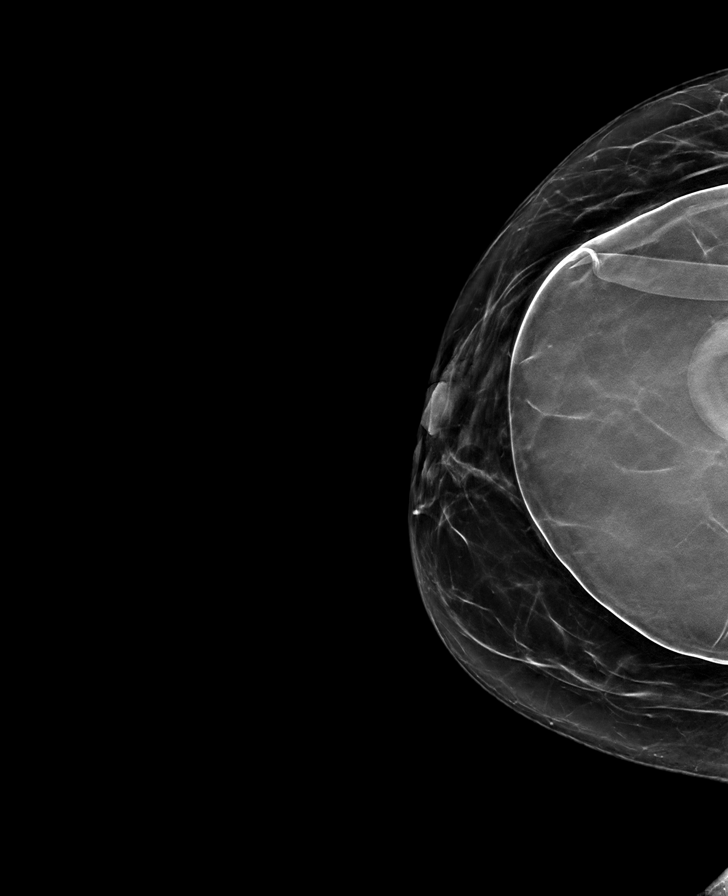

[L MLO synth-2D]
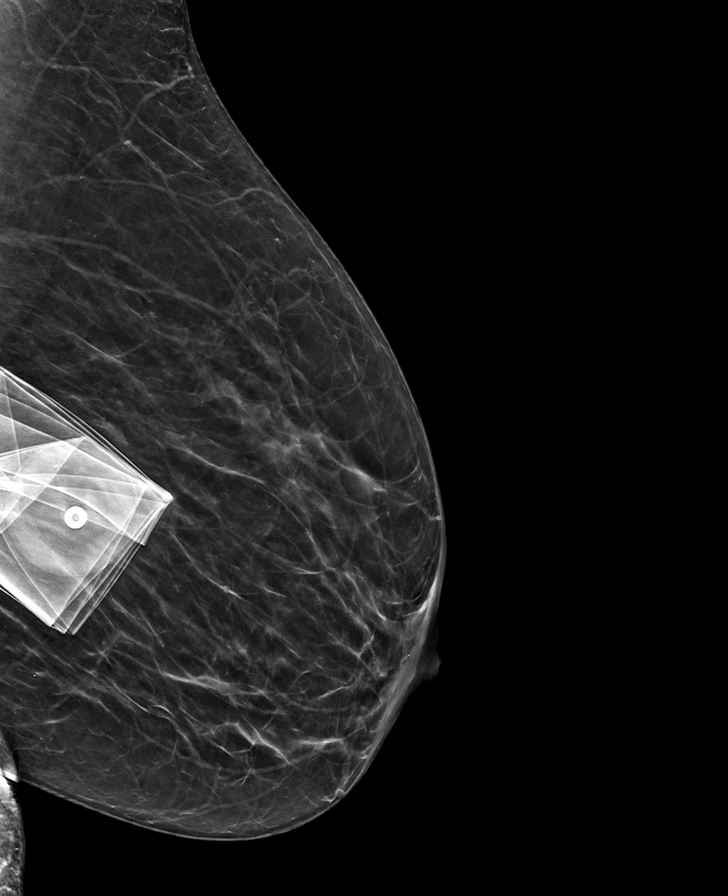

[R MLO synth-2D]
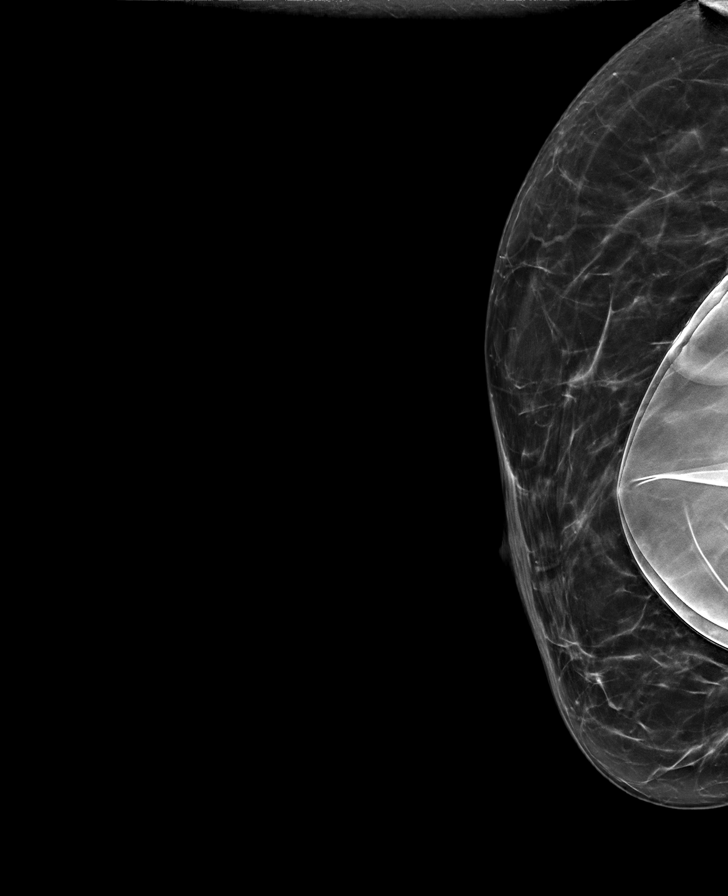

[R CC synth-2D (2 of 2)]
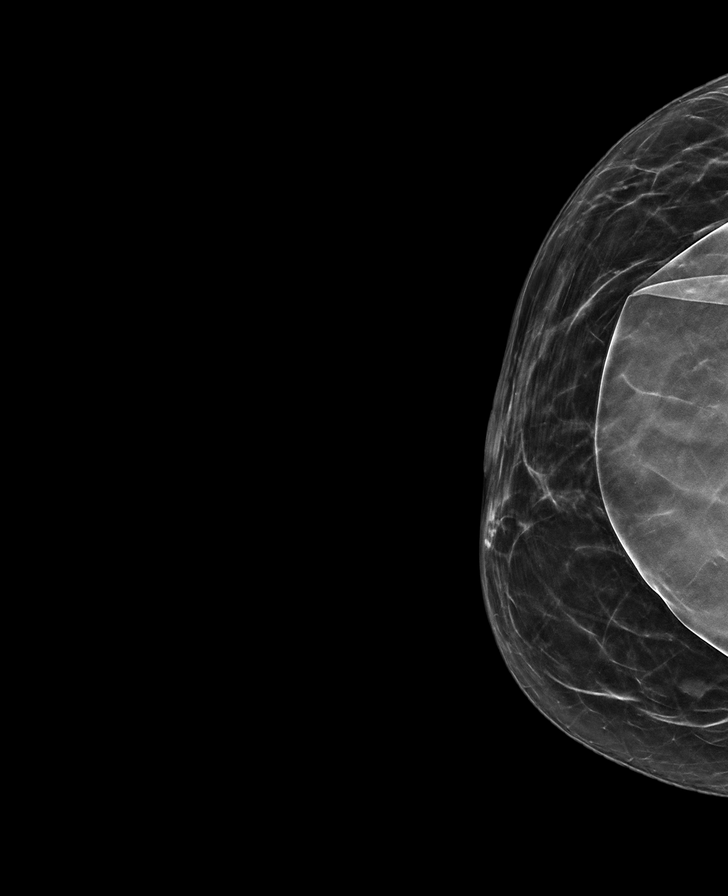

[8 of 34 positions shown; findings below may reference images not displayed]

ACR Breast Density Category b: There are scattered areas of
fibroglandular density.
FINDINGS: The patient has prepectoral saline implants. LEFT breast implant is
collapsed.

In the right breast, a possible mass warrants further evaluation.
This possible mass is seen within the inner RIGHT breast on CC [DATE]
slice 28 and CC [DATE] slice 29.

In the left breast, no findings suspicious for malignancy.
IMPRESSION: Further evaluation is suggested for possible mass in the right
breast.

RECOMMENDATION:
Ultrasound of the right breast. (Code:[id number])

The patient will be contacted regarding the findings, and additional
imaging will be scheduled.

BI-RADS CATEGORY  0: Incomplete. Need additional imaging evaluation
and/or prior mammograms for comparison.

## 2023-02-17 ENCOUNTER — Other Ambulatory Visit (HOSPITAL_BASED_OUTPATIENT_CLINIC_OR_DEPARTMENT_OTHER): Payer: Self-pay

## 2023-02-17 ENCOUNTER — Other Ambulatory Visit: Payer: Self-pay

## 2023-02-17 ENCOUNTER — Other Ambulatory Visit: Payer: Self-pay | Admitting: Nurse Practitioner

## 2023-02-17 DIAGNOSIS — E661 Drug-induced obesity: Secondary | ICD-10-CM

## 2023-02-17 MED ORDER — WEGOVY 1 MG/0.5ML ~~LOC~~ SOAJ
1.0000 mg | SUBCUTANEOUS | 1 refills | Status: DC
  Filled 2023-02-17: qty 2, 28d supply, fill #0
  Filled 2023-03-13 (×2): qty 2, 28d supply, fill #1

## 2023-03-01 IMAGING — US US BREAST*R* LIMITED INC AXILLA
1 series · 4 of 4 positions shown · non-contrast
Comparison: Previous examinations, including the screening
mammogram dated 07/31/2021.

CLINICAL DATA: Possible mass in the medial right breast on a recent
screening mammogram.

EXAM:
ULTRASOUND OF THE RIGHT BREAST

[Series 1: us breast*right* limited inc axilla · 0.06mm/px · 4 of 4 slices shown]
[im 1/4]
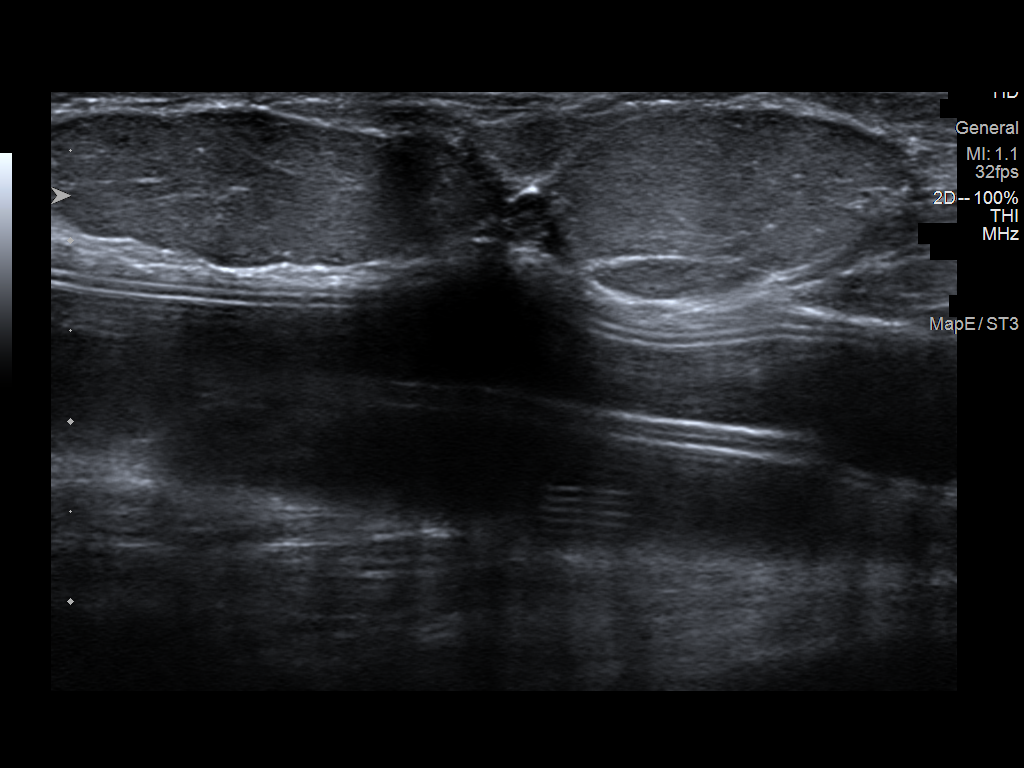
[im 2/4]
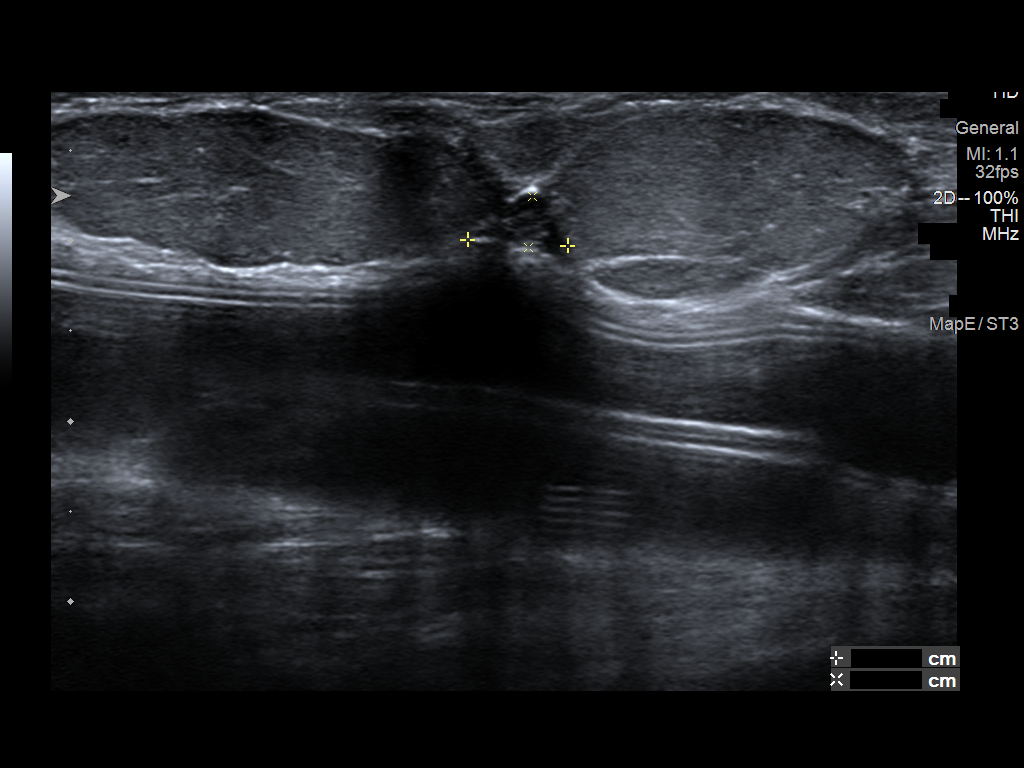
[im 3/4]
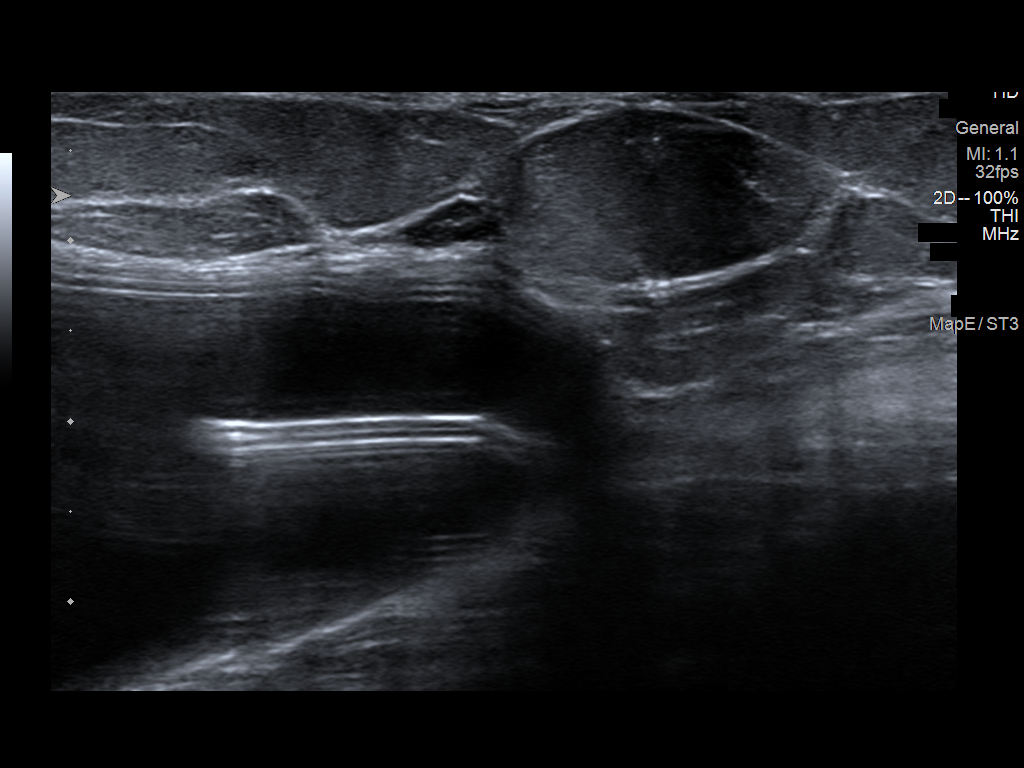
[im 4/4]
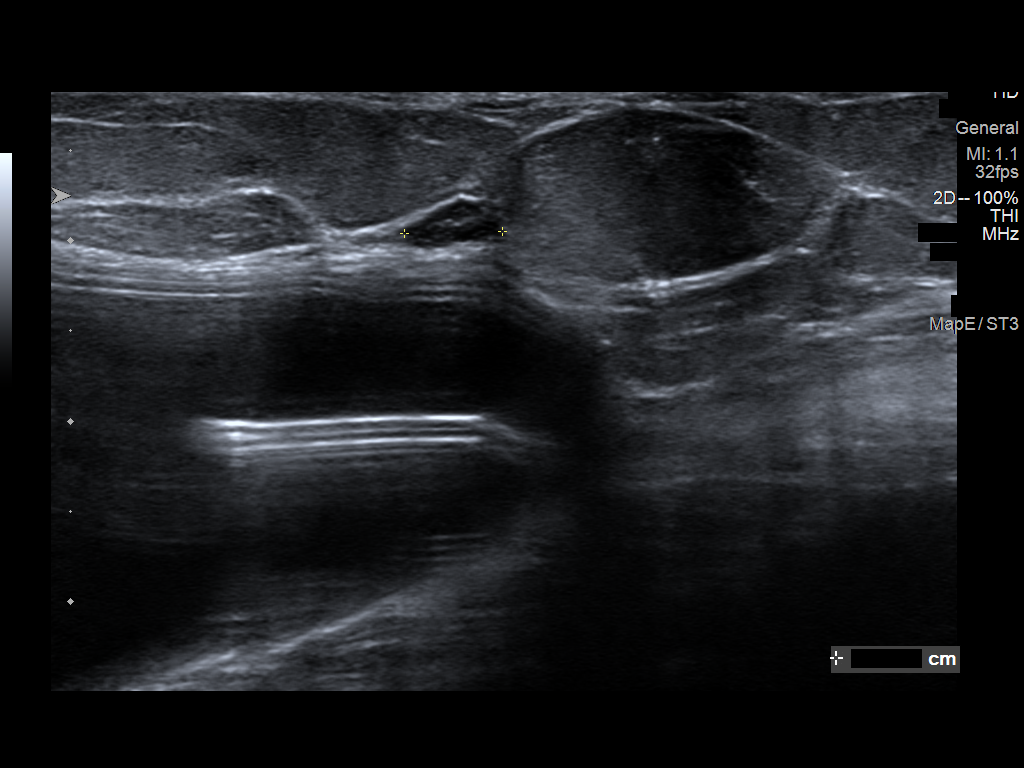

[4 of 4 positions shown; findings below may reference images not displayed]

FINDINGS: On physical exam, there is a small, oval, circumscribed mass
palpable in the 3 o'clock position of the right breast, 5 cm from
the nipple.

Targeted ultrasound is performed, showing a 6 x 5 x 3 mm cluster of
cysts in the 3 o'clock position of the right breast, 5 cm from the
nipple. This corresponds to the mammographic mass.
IMPRESSION: 6 mm benign cluster of cysts in the 3 o'clock position of the right
breast.

RECOMMENDATION:
Bilateral screening mammogram in 1 year when due.

I have discussed the findings and recommendations with the patient.
If applicable, a reminder letter will be sent to the patient
regarding the next appointment.

BI-RADS CATEGORY  2: Benign.

## 2023-03-13 ENCOUNTER — Other Ambulatory Visit (HOSPITAL_BASED_OUTPATIENT_CLINIC_OR_DEPARTMENT_OTHER): Payer: Self-pay

## 2023-03-25 ENCOUNTER — Other Ambulatory Visit: Payer: Self-pay | Admitting: Internal Medicine

## 2023-03-25 DIAGNOSIS — I158 Other secondary hypertension: Secondary | ICD-10-CM

## 2023-03-26 NOTE — Telephone Encounter (Signed)
Called and spoke with the pharmacy that stated that it's too early for them to receive a refill for this medication but she still has a 90 quantity with 2 refills on it.

## 2023-04-20 ENCOUNTER — Other Ambulatory Visit (HOSPITAL_BASED_OUTPATIENT_CLINIC_OR_DEPARTMENT_OTHER): Payer: Self-pay

## 2023-04-23 ENCOUNTER — Other Ambulatory Visit (HOSPITAL_COMMUNITY): Payer: Self-pay

## 2023-04-23 ENCOUNTER — Ambulatory Visit (INDEPENDENT_AMBULATORY_CARE_PROVIDER_SITE_OTHER): Payer: 59 | Admitting: Nurse Practitioner

## 2023-04-23 ENCOUNTER — Encounter: Payer: Self-pay | Admitting: Nurse Practitioner

## 2023-04-23 VITALS — BP 130/70 | HR 83 | Temp 98.9°F | Ht 67.0 in | Wt 267.8 lb

## 2023-04-23 DIAGNOSIS — Z6841 Body Mass Index (BMI) 40.0 and over, adult: Secondary | ICD-10-CM | POA: Diagnosis not present

## 2023-04-23 DIAGNOSIS — E661 Drug-induced obesity: Secondary | ICD-10-CM

## 2023-04-23 DIAGNOSIS — R7309 Other abnormal glucose: Secondary | ICD-10-CM

## 2023-04-23 DIAGNOSIS — I10 Essential (primary) hypertension: Secondary | ICD-10-CM

## 2023-04-23 MED ORDER — WEGOVY 1 MG/0.5ML ~~LOC~~ SOAJ
1.0000 mg | SUBCUTANEOUS | 1 refills | Status: DC
  Filled 2023-04-23: qty 2, 28d supply, fill #0

## 2023-04-23 NOTE — Progress Notes (Signed)
Natasha [name],acting as a Neurosurgeon for Natasha Felts, FNP.,have documented all relevant documentation on the behalf of Natasha Felts, FNP,as directed by  Natasha Felts, FNP while in the presence of Natasha Felts, FNP.    Subjective:     Patient ID: Natasha [name] , female    DOB: 1966/07/23 , 57 y.o.   MRN: [id number]   Chief Complaint  Patient presents with   Diabetes   Hypertension    HPI  Patient presents today for a BP, DM check. Patient states compliance with medications and has no other concerns today.  Patient reports she believes her wegovy is making her loose muscle so she takes it every 2 weeks. She has loose skin to her posterior arms. She has loose skin to her posterior thighs.    BP Readings from Last 3 Encounters: 04/23/23 : 130/70 11/24/22 : 124/77 11/19/22 : 128/72   Wt Readings from Last 3 Encounters: 04/23/23 : 267 lb 12.8 oz (121.5 kg) 11/19/22 : 267 lb (121.1 kg) 09/17/22 : 270 lb (122.5 kg)       Past Medical History:  Diagnosis Date   Colon polyps    Diabetes mellitus without complication (HCC)    pre diabetes   GERD (gastroesophageal reflux disease)    Hypertension    Obesity      Family History  Problem Relation Age of Onset   Kidney disease Mother    Hypertension Mother    Heart disease Father    Diabetes Father    Colon cancer Maternal Grandmother      Current Outpatient Medications:    amLODipine (NORVASC) 5 MG tablet, TAKE 1 TABLET (5 MG TOTAL) BY MOUTH DAILY., Disp: 90 tablet, Rfl: 3   Blood Glucose Monitoring Suppl (FREESTYLE LITE) w/Device KIT, 1 each by Does not apply route 3 (three) times daily. DX: E11.65, Disp: 1 kit, Rfl: 1   Cholecalciferol (VITAMIN D) 125 MCG (5000 UT) CAPS, Take 1 capsule by mouth daily at 6 (six) AM., Disp: , Rfl:    gabapentin (NEURONTIN) 800 MG tablet, Take 1 tablet (800 mg total) by mouth at bedtime., Disp: 90 tablet, Rfl: 1   Insulin Pen Needle (NOVOFINE) 30G X 8 MM MISC, USE AS DIRECTED WITH SAXENDA,  Disp: 100 each, Rfl: 2   Lancets (FREESTYLE) lancets, Use to check blood sugars 3 times a week DX: E11.65, Disp: 150 each, Rfl: 3   LORazepam (ATIVAN) 0.5 MG tablet, Take 1 tablet (0.5 mg total) by mouth 2 (two) times daily., Disp: 20 tablet, Rfl: 0   metFORMIN (GLUCOPHAGE) 500 MG tablet, Take 1 tablet (500 mg total) by mouth 2 (two) times daily with a meal., Disp: 60 tablet, Rfl: 2   omeprazole (PRILOSEC) 20 MG capsule, Take 1 capsule (20 mg total) by mouth daily., Disp: 30 capsule, Rfl: 2   telmisartan-hydrochlorothiazide (MICARDIS HCT) 80-25 MG tablet, Take 1 tablet by mouth daily., Disp: 90 tablet, Rfl: 3   thiamine (VITAMIN B-1) 100 MG tablet, Take 100 mg by mouth daily., Disp: , Rfl:    vitamin B-12 (CYANOCOBALAMIN) 500 MCG tablet, Take 500 mcg by mouth daily., Disp: , Rfl:    Eszopiclone 3 MG TABS, Take 1 tablet (3 mg total) by mouth at bedtime as needed for sleep., Disp: 30 tablet, Rfl: 2   Semaglutide-Weight Management (WEGOVY) 1 MG/0.5ML SOAJ, Inject 1 mg into the skin once a week., Disp: 2 mL, Rfl: 1   No Known Allergies   Review of Systems  Constitutional: Negative.  Respiratory: Negative.    Cardiovascular: Negative.   Neurological: Negative.   Psychiatric/Behavioral: Negative.       Today's Vitals   04/23/23 1134  BP: 130/70  Pulse: 83  Temp: 98.9 F (37.2 C)  TempSrc: Oral  Weight: 267 lb 12.8 oz (121.5 kg)  Height: 5\' 7"  (1.702 m)  PainSc: 0-No pain   Body mass index is 41.94 kg/m.  Wt Readings from Last 3 Encounters:  04/23/23 267 lb 12.8 oz (121.5 kg)  11/19/22 267 lb (121.1 kg)  09/17/22 270 lb (122.5 kg)    The ASCVD Risk score (Arnett DK, et al., 2019) failed to calculate for the following reasons:   Cannot find a previous HDL lab   Cannot find a previous total cholesterol lab  Objective:  Physical Exam Vitals reviewed.  Constitutional:      General: She is not in acute distress.    Appearance: Normal appearance.  Cardiovascular:     Rate and  Rhythm: Normal rate and regular rhythm.     Pulses: Normal pulses.     Heart sounds: Normal heart sounds. No murmur heard. Pulmonary:     Effort: Pulmonary effort is normal. No respiratory distress.     Breath sounds: Normal breath sounds. No wheezing.  Skin:    General: Skin is warm and dry.     Capillary Refill: Capillary refill takes less than 2 seconds.  Neurological:     General: No focal deficit present.     Mental Status: She is alert and oriented to person, place, and time.     Cranial Nerves: No cranial nerve deficit.     Motor: No weakness.         Assessment And Plan:     1. Abnormal glucose Comments: HgbA1c was up at last visit to 6.0, continue focusing on diet low in sugar and starches. - Hemoglobin A1c  2. Essential hypertension Comments: Blood pressure is well controlled, continue current medications - Microalbumin / Creatinine Urine Ratio - Basic metabolic panel  3. Class 3 drug-induced obesity with serious comorbidity and body mass index (BMI) of 40.0 to 44.9 in adult [facility]) Comments: She is doing well with her weight loss, she does have excess skin to her posterior calf, thigh and arms. Wearing biker shorts to keep from chaffing skin. - Semaglutide-Weight Management (WEGOVY) 1 MG/0.5ML SOAJ; Inject 1 mg into the skin once a week.  Dispense: 2 mL; Refill: 1    Return for 6 month bp check.   Patient was given opportunity to ask questions. Patient verbalized understanding of the plan and was able to repeat key elements of the plan. All questions were answered to their satisfaction.  Natasha Felts, FNP   I, Natasha Felts, FNP, have reviewed all documentation for this visit. The documentation on 04/23/23 for the exam, diagnosis, procedures, and orders are all accurate and complete.   IF YOU HAVE BEEN REFERRED TO A SPECIALIST, IT MAY TAKE 1-2 WEEKS TO SCHEDULE/PROCESS THE REFERRAL. IF YOU HAVE NOT HEARD FROM US/SPECIALIST IN TWO WEEKS, PLEASE GIVE [place] A CALL AT  [phone] X [phone].   THE PATIENT IS ENCOURAGED TO PRACTICE SOCIAL DISTANCING DUE TO THE COVID-19 PANDEMIC.

## 2023-04-23 NOTE — Patient Instructions (Signed)
Hypertension, Adult ?Hypertension is another name for high blood pressure. High blood pressure forces your heart to work harder to pump blood. This can cause problems over time. ?There are two numbers in a blood pressure reading. There is a top number (systolic) over a bottom number (diastolic). It is best to have a blood pressure that is below 120/80. ?What are the causes? ?The cause of this condition is not known. Some other conditions can lead to high blood pressure. ?What increases the risk? ?Some lifestyle factors can make you more likely to develop high blood pressure: ?Smoking. ?Not getting enough exercise or physical activity. ?Being overweight. ?Having too much fat, sugar, calories, or salt (sodium) in your diet. ?Drinking too much alcohol. ?Other risk factors include: ?Having any of these conditions: ?Heart disease. ?Diabetes. ?High cholesterol. ?Kidney disease. ?Obstructive sleep apnea. ?Having a family history of high blood pressure and high cholesterol. ?Age. The risk increases with age. ?Stress. ?What are the signs or symptoms? ?High blood pressure may not cause symptoms. Very high blood pressure (hypertensive crisis) may cause: ?Headache. ?Fast or uneven heartbeats (palpitations). ?Shortness of breath. ?Nosebleed. ?Vomiting or feeling like you may vomit (nauseous). ?Changes in how you see. ?Very bad chest pain. ?Feeling dizzy. ?Seizures. ?How is this treated? ?This condition is treated by making healthy lifestyle changes, such as: ?Eating healthy foods. ?Exercising more. ?Drinking less alcohol. ?Your doctor may prescribe medicine if lifestyle changes do not help enough and if: ?Your top number is above 130. ?Your bottom number is above 80. ?Your personal target blood pressure may vary. ?Follow these instructions at home: ?Eating and drinking ? ?If told, follow the DASH eating plan. To follow this plan: ?Fill one half of your plate at each meal with fruits and vegetables. ?Fill one fourth of your plate  at each meal with whole grains. Whole grains include whole-wheat pasta, brown rice, and whole-grain bread. ?Eat or drink low-fat dairy products, such as skim milk or low-fat yogurt. ?Fill one fourth of your plate at each meal with low-fat (lean) proteins. Low-fat proteins include fish, chicken without skin, eggs, beans, and tofu. ?Avoid fatty meat, cured and processed meat, or chicken with skin. ?Avoid pre-made or processed food. ?Limit the amount of salt in your diet to less than 1,500 mg each day. ?Do not drink alcohol if: ?Your doctor tells you not to drink. ?You are pregnant, may be pregnant, or are planning to become pregnant. ?If you drink alcohol: ?Limit how much you have to: ?0-1 drink a day for women. ?0-2 drinks a day for men. ?Know how much alcohol is in your drink. In the [place] one drink equals one 12 oz bottle of beer (355 mL), one 5 oz glass of wine (148 mL), or one 1? oz glass of hard liquor (44 mL). ?Lifestyle ? ?Work with your doctor to stay at a healthy weight or to lose weight. Ask your doctor what the best weight is for you. ?Get at least 30 minutes of exercise that causes your heart to beat faster (aerobic exercise) most days of the week. This may include walking, swimming, or biking. ?Get at least 30 minutes of exercise that strengthens your muscles (resistance exercise) at least 3 days a week. This may include lifting weights or doing Pilates. ?Do not smoke or use any products that contain nicotine or tobacco. If you need help quitting, ask your doctor. ?Check your blood pressure at home as told by your doctor. ?Keep all follow-up visits. ?Medicines ?Take over-the-counter and prescription medicines   only as told by your doctor. Follow directions carefully. ?Do not skip doses of blood pressure medicine. The medicine does not work as well if you skip doses. Skipping doses also puts you at risk for problems. ?Ask your doctor about side effects or reactions to medicines that you should watch  for. ?Contact a doctor if: ?You think you are having a reaction to the medicine you are taking. ?You have headaches that keep coming back. ?You feel dizzy. ?You have swelling in your ankles. ?You have trouble with your vision. ?Get help right away if: ?You get a very bad headache. ?You start to feel mixed up (confused). ?You feel weak or numb. ?You feel faint. ?You have very bad pain in your: ?Chest. ?Belly (abdomen). ?You vomit more than once. ?You have trouble breathing. ?These symptoms may be an emergency. Get help right away. Call 911. ?Do not wait to see if the symptoms will go away. ?Do not drive yourself to the hospital. ?Summary ?Hypertension is another name for high blood pressure. ?High blood pressure forces your heart to work harder to pump blood. ?For most people, a normal blood pressure is less than 120/80. ?Making healthy choices can help lower blood pressure. If your blood pressure does not get lower with healthy choices, you may need to take medicine. ?This information is not intended to replace advice given to you by your health care provider. Make sure you discuss any questions you have with your health care provider. ?Document Revised: 09/11/2021 Document Reviewed: 09/11/2021 ?Elsevier Patient Education ? 2023 [employer] ? ?

## 2023-04-24 ENCOUNTER — Encounter: Payer: Self-pay | Admitting: Nurse Practitioner

## 2023-04-24 LAB — MICROALBUMIN / CREATININE URINE RATIO
Creatinine, Urine: 134.5 mg/dL
Microalb/Creat Ratio: 4 mg/g creat (ref 0–29)
Microalbumin, Urine: 5.5 ug/mL

## 2023-04-24 LAB — BASIC METABOLIC PANEL
BUN/Creatinine Ratio: 16 (ref 9–23)
BUN: 16 mg/dL (ref 6–24)
CO2: 24 mmol/L (ref 20–29)
Calcium: 9.6 mg/dL (ref 8.7–10.2)
Chloride: 102 mmol/L (ref 96–106)
Creatinine, Ser: 0.97 mg/dL (ref 0.57–1.00)
Glucose: 89 mg/dL (ref 70–99)
Potassium: 4.3 mmol/L (ref 3.5–5.2)
Sodium: 142 mmol/L (ref 134–144)
eGFR: 69 mL/min/{1.73_m2} (ref >59)

## 2023-04-24 LAB — HEMOGLOBIN A1C
Est. average glucose Bld gHb Est-mCnc: 123 mg/dL
Hgb A1c MFr Bld: 5.9 % — ABNORMAL HIGH (ref 4.8–5.6)

## 2023-04-26 ENCOUNTER — Other Ambulatory Visit (HOSPITAL_COMMUNITY): Payer: Self-pay

## 2023-05-05 ENCOUNTER — Other Ambulatory Visit (HOSPITAL_COMMUNITY): Payer: Self-pay

## 2023-05-21 ENCOUNTER — Other Ambulatory Visit (HOSPITAL_COMMUNITY): Payer: Self-pay

## 2023-05-21 ENCOUNTER — Encounter: Payer: Self-pay | Admitting: Nurse Practitioner

## 2023-05-21 DIAGNOSIS — Z01411 Encounter for gynecological examination (general) (routine) with abnormal findings: Secondary | ICD-10-CM | POA: Diagnosis not present

## 2023-05-21 DIAGNOSIS — Z139 Encounter for screening, unspecified: Secondary | ICD-10-CM | POA: Diagnosis not present

## 2023-05-21 DIAGNOSIS — Z01419 Encounter for gynecological examination (general) (routine) without abnormal findings: Secondary | ICD-10-CM | POA: Diagnosis not present

## 2023-05-21 DIAGNOSIS — Z113 Encounter for screening for infections with a predominantly sexual mode of transmission: Secondary | ICD-10-CM | POA: Diagnosis not present

## 2023-05-21 DIAGNOSIS — Z124 Encounter for screening for malignant neoplasm of cervix: Secondary | ICD-10-CM | POA: Diagnosis not present

## 2023-05-21 DIAGNOSIS — N951 Menopausal and female climacteric states: Secondary | ICD-10-CM | POA: Diagnosis not present

## 2023-05-21 DIAGNOSIS — Z6841 Body Mass Index (BMI) 40.0 and over, adult: Secondary | ICD-10-CM | POA: Diagnosis not present

## 2023-05-21 MED ORDER — VEOZAH 45 MG PO TABS
ORAL_TABLET | ORAL | 6 refills | Status: DC
  Filled 2023-05-21: qty 30, 30d supply, fill #0

## 2023-05-25 ENCOUNTER — Other Ambulatory Visit: Payer: Self-pay | Admitting: Nurse Practitioner

## 2023-05-25 DIAGNOSIS — E661 Drug-induced obesity: Secondary | ICD-10-CM

## 2023-05-25 MED ORDER — NALTREXONE-BUPROPION HCL ER 8-90 MG PO TB12: ORAL_TABLET | ORAL | 1 refills | Status: DC

## 2023-05-27 ENCOUNTER — Other Ambulatory Visit (HOSPITAL_COMMUNITY): Payer: Self-pay

## 2023-06-01 ENCOUNTER — Other Ambulatory Visit (HOSPITAL_BASED_OUTPATIENT_CLINIC_OR_DEPARTMENT_OTHER): Payer: Self-pay

## 2023-06-01 ENCOUNTER — Encounter (HOSPITAL_BASED_OUTPATIENT_CLINIC_OR_DEPARTMENT_OTHER): Payer: Self-pay | Admitting: Pharmacist

## 2023-06-01 MED ORDER — VEOZAH 45 MG PO TABS
45.0000 mg | ORAL_TABLET | Freq: Every day | ORAL | 11 refills | Status: DC
  Filled 2023-06-01: qty 30, 30d supply, fill #0

## 2023-06-02 ENCOUNTER — Other Ambulatory Visit (HOSPITAL_BASED_OUTPATIENT_CLINIC_OR_DEPARTMENT_OTHER): Payer: Self-pay

## 2023-06-08 ENCOUNTER — Other Ambulatory Visit (HOSPITAL_COMMUNITY): Payer: Self-pay

## 2023-06-12 ENCOUNTER — Other Ambulatory Visit (HOSPITAL_BASED_OUTPATIENT_CLINIC_OR_DEPARTMENT_OTHER): Payer: Self-pay

## 2023-07-11 ENCOUNTER — Other Ambulatory Visit (HOSPITAL_COMMUNITY): Payer: Self-pay

## 2023-07-12 ENCOUNTER — Other Ambulatory Visit (HOSPITAL_COMMUNITY): Payer: Self-pay

## 2023-07-14 ENCOUNTER — Other Ambulatory Visit (HOSPITAL_BASED_OUTPATIENT_CLINIC_OR_DEPARTMENT_OTHER): Payer: Self-pay

## 2023-07-15 ENCOUNTER — Other Ambulatory Visit (HOSPITAL_COMMUNITY): Payer: Self-pay

## 2023-07-15 ENCOUNTER — Other Ambulatory Visit (HOSPITAL_BASED_OUTPATIENT_CLINIC_OR_DEPARTMENT_OTHER): Payer: Self-pay

## 2023-07-15 DIAGNOSIS — F3181 Bipolar II disorder: Secondary | ICD-10-CM | POA: Diagnosis not present

## 2023-07-15 MED ORDER — ESCITALOPRAM OXALATE 10 MG PO TABS
10.0000 mg | ORAL_TABLET | Freq: Every day | ORAL | 0 refills | Status: DC
  Filled 2023-07-15 (×2): qty 30, 30d supply, fill #0

## 2023-07-15 MED ORDER — NALTREXONE HCL 50 MG PO TABS
50.0000 mg | ORAL_TABLET | Freq: Every day | ORAL | 0 refills | Status: DC
  Filled 2023-07-15 (×2): qty 30, 30d supply, fill #0

## 2023-07-15 MED ORDER — TRAZODONE HCL 50 MG PO TABS
50.0000 mg | ORAL_TABLET | Freq: Every day | ORAL | 0 refills | Status: DC
  Filled 2023-07-15 (×2): qty 30, 30d supply, fill #0

## 2023-07-16 ENCOUNTER — Other Ambulatory Visit (HOSPITAL_BASED_OUTPATIENT_CLINIC_OR_DEPARTMENT_OTHER): Payer: Self-pay

## 2023-07-16 MED ORDER — GABAPENTIN 800 MG PO TABS
800.0000 mg | ORAL_TABLET | Freq: Every day | ORAL | 1 refills | Status: DC
  Filled 2023-07-16: qty 90, 90d supply, fill #0
  Filled 2023-10-06: qty 90, 90d supply, fill #1

## 2023-07-20 ENCOUNTER — Other Ambulatory Visit (HOSPITAL_BASED_OUTPATIENT_CLINIC_OR_DEPARTMENT_OTHER): Payer: Self-pay

## 2023-07-22 DIAGNOSIS — F3289 Other specified depressive episodes: Secondary | ICD-10-CM | POA: Diagnosis not present

## 2023-07-29 ENCOUNTER — Other Ambulatory Visit (HOSPITAL_BASED_OUTPATIENT_CLINIC_OR_DEPARTMENT_OTHER): Payer: Self-pay

## 2023-07-29 DIAGNOSIS — F331 Major depressive disorder, recurrent, moderate: Secondary | ICD-10-CM | POA: Diagnosis not present

## 2023-07-29 DIAGNOSIS — F4323 Adjustment disorder with mixed anxiety and depressed mood: Secondary | ICD-10-CM | POA: Diagnosis not present

## 2023-07-29 DIAGNOSIS — F413 Other mixed anxiety disorders: Secondary | ICD-10-CM | POA: Diagnosis not present

## 2023-07-29 DIAGNOSIS — F3289 Other specified depressive episodes: Secondary | ICD-10-CM | POA: Diagnosis not present

## 2023-07-29 MED ORDER — QUETIAPINE FUMARATE 25 MG PO TABS
25.0000 mg | ORAL_TABLET | Freq: Every day | ORAL | 0 refills | Status: DC
  Filled 2023-07-29: qty 30, 30d supply, fill #0

## 2023-08-05 DIAGNOSIS — F3289 Other specified depressive episodes: Secondary | ICD-10-CM | POA: Diagnosis not present

## 2023-08-13 DIAGNOSIS — F3289 Other specified depressive episodes: Secondary | ICD-10-CM | POA: Diagnosis not present

## 2023-08-19 DIAGNOSIS — F3289 Other specified depressive episodes: Secondary | ICD-10-CM | POA: Diagnosis not present

## 2023-08-26 DIAGNOSIS — F3289 Other specified depressive episodes: Secondary | ICD-10-CM | POA: Diagnosis not present

## 2023-09-02 DIAGNOSIS — F3289 Other specified depressive episodes: Secondary | ICD-10-CM | POA: Diagnosis not present

## 2023-09-09 DIAGNOSIS — F3289 Other specified depressive episodes: Secondary | ICD-10-CM | POA: Diagnosis not present

## 2023-09-11 ENCOUNTER — Other Ambulatory Visit: Payer: Self-pay | Admitting: Internal Medicine

## 2023-09-16 DIAGNOSIS — F3289 Other specified depressive episodes: Secondary | ICD-10-CM | POA: Diagnosis not present

## 2023-09-23 DIAGNOSIS — F3289 Other specified depressive episodes: Secondary | ICD-10-CM | POA: Diagnosis not present

## 2023-09-30 DIAGNOSIS — F3289 Other specified depressive episodes: Secondary | ICD-10-CM | POA: Diagnosis not present

## 2023-10-02 ENCOUNTER — Other Ambulatory Visit (HOSPITAL_BASED_OUTPATIENT_CLINIC_OR_DEPARTMENT_OTHER): Payer: Self-pay

## 2023-10-02 ENCOUNTER — Other Ambulatory Visit: Payer: Self-pay | Admitting: Nurse Practitioner

## 2023-10-02 DIAGNOSIS — R7309 Other abnormal glucose: Secondary | ICD-10-CM

## 2023-10-05 ENCOUNTER — Other Ambulatory Visit (HOSPITAL_BASED_OUTPATIENT_CLINIC_OR_DEPARTMENT_OTHER): Payer: Self-pay

## 2023-10-05 ENCOUNTER — Other Ambulatory Visit: Payer: Self-pay

## 2023-10-05 MED ORDER — METFORMIN HCL 500 MG PO TABS
500.0000 mg | ORAL_TABLET | Freq: Two times a day (BID) | ORAL | 2 refills | Status: DC
  Filled 2023-10-05: qty 60, 30d supply, fill #0
  Filled 2023-11-02: qty 60, 30d supply, fill #1
  Filled 2024-03-09: qty 60, 30d supply, fill #2

## 2023-10-07 ENCOUNTER — Other Ambulatory Visit (HOSPITAL_BASED_OUTPATIENT_CLINIC_OR_DEPARTMENT_OTHER): Payer: Self-pay

## 2023-10-13 ENCOUNTER — Encounter: Payer: Self-pay | Admitting: Internal Medicine

## 2023-10-13 ENCOUNTER — Ambulatory Visit: Payer: 59 | Admitting: Internal Medicine

## 2023-10-13 VITALS — BP 118/84 | HR 84 | Ht 67.0 in | Wt 277.0 lb

## 2023-10-13 DIAGNOSIS — Z9884 Bariatric surgery status: Secondary | ICD-10-CM | POA: Diagnosis not present

## 2023-10-13 DIAGNOSIS — R1084 Generalized abdominal pain: Secondary | ICD-10-CM

## 2023-10-13 DIAGNOSIS — K219 Gastro-esophageal reflux disease without esophagitis: Secondary | ICD-10-CM | POA: Diagnosis not present

## 2023-10-13 DIAGNOSIS — R143 Flatulence: Secondary | ICD-10-CM | POA: Diagnosis not present

## 2023-10-13 NOTE — Patient Instructions (Addendum)
Your provider has requested that you go to the basement level for lab work before leaving today. Press "B" on the elevator. The lab is located at the first door on the left as you exit the elevator.  You have been scheduled for a CT scan of the abdomen and pelvis at [facility], 1st floor Radiology. You are scheduled on 10/14/23 at 2:30. You should arrive at 12:15 pm time for registration and drink contrast  You may take any medications as prescribed with a small amount of water, if necessary. If you take any of the following medications: METFORMIN, GLUCOPHAGE, GLUCOVANCE, AVANDAMET, RIOMET, FORTAMET, ACTOPLUS MET, JANUMET, GLUMETZA or METAGLIP, you MAY be asked to HOLD this medication 48 hours AFTER the exam.   The purpose of you drinking the oral contrast is to aid in the visualization of your intestinal tract. The contrast solution may cause some diarrhea. Depending on your individual set of symptoms, you may also receive an intravenous injection of x-ray contrast/dye. Plan on being at [facility] for 45 minutes or longer, depending on the type of exam you are having performed.   If you have any questions regarding your exam or if you need to reschedule, you may call [facility] at [phone] between the hours of 8:00 am and 5:00 pm, Monday-Friday.   You have been given a testing kit to check for small intestine bacterial overgrowth (SIBO) which is completed by a company named [employer]. Make sure to return your test in the mail using the return mailing label given to you along with the kit. The test order, your demographic and insurance information have all already been sent to the company. [employer] will collect an upfront charge of $99.74 for commercial insurance plans and $209.74 if you are paying cash. Make sure to discuss with [facility] PRIOR to having the test to see if they have gotten information from your insurance company as to how much your testing will  cost out of pocket, if any. Please contact [employer] at phone number [phone] to get instructions regarding how to perform the test as our office is unable to give specific testing instructions.  You have been scheduled for an endoscopy. Please follow written instructions given to you at your visit today.  If you use inhalers (even only as needed), please bring them with you on the day of your procedure.  If you take any of the following medications, they will need to be adjusted prior to your procedure:   DO NOT TAKE 7 DAYS PRIOR TO TEST- Trulicity (dulaglutide) Ozempic, Wegovy (semaglutide) Mounjaro (tirzepatide) Bydureon Bcise (exanatide extended release)  DO NOT TAKE 1 DAY PRIOR TO YOUR TEST Rybelsus (semaglutide) Adlyxin (lixisenatide) Victoza (liraglutide) Byetta (exanatide) ___________________________________________________________________________   _______________________________________________________  If your blood pressure at your visit was 140/90 or greater, please contact your primary care physician to follow up on this.  _______________________________________________________  If you are age 48 or older, your body mass index should be between 23-30. Your Body mass index is 43.38 kg/m. If this is out of the aforementioned range listed, please consider follow up with your Primary Care Provider.  If you are age 81 or younger, your body mass index should be between 19-25. Your Body mass index is 43.38 kg/m. If this is out of the aformentioned range listed, please consider follow up with your Primary Care Provider.   ________________________________________________________  The [place] [facility] providers would like to encourage you to use [facility] to communicate with providers for non-urgent  requests or questions.  Due to long hold times on the telephone, sending your provider a message by [facility] may be a faster and more efficient way to get a response.  Please  allow 48 business hours for a response.  Please remember that this is for non-urgent requests.  _______________________________________________________  Due to recent changes in healthcare laws, you may see the results of your imaging and laboratory studies on MyChart before your provider has had a chance to review them.  We understand that in some cases there may be results that are confusing or concerning to you. Not all laboratory results come back in the same time frame and the provider may be waiting for multiple results in order to interpret others.  Please give [place] 48 hours in order for your provider to thoroughly review all the results before contacting the office for clarification of your results.   Thank you for entrusting me with your care and for choosing [facility], Dr. [name]   Try to decrease alcohol use   If your blood pressure at your visit was 140/90 or greater, please contact your primary care physician to follow up on this.  _______________________________________________________  If you are age 30 or older, your body mass index should be between 23-30. Your Body mass index is 43.38 kg/m. If this is out of the aforementioned range listed, please consider follow up with your Primary Care Provider.  If you are age 42 or younger, your body mass index should be between 19-25. Your Body mass index is 43.38 kg/m. If this is out of the aformentioned range listed, please consider follow up with your Primary Care Provider.   ________________________________________________________  The [place] [facility] providers would like to encourage you to use [facility] to communicate with providers for non-urgent requests or questions.  Due to long hold times on the telephone, sending your provider a message by [facility] may be a faster and more efficient way to get a response.  Please allow 48 business hours for a response.  Please remember that this is for non-urgent requests.   _______________________________________________________   Thank you for entrusting me with your care and for choosing [facility], Dr. [name]

## 2023-10-13 NOTE — Progress Notes (Addendum)
Chief Complaint: Ab pain, gas  HPI : 21 female with history of pre-diabetes, obesity s/p gastric bypass in 2004, and hypertension presents for ab pain and gas  She has issues with GERD in the past that are not excessively bothersome. Used to be on omeprazole.   Interval History: Her stomach hurts almost every time (about 85% of the time) that she eats. Certain foods do make her stomach pain worse. The pain feels like a discomfort that is generalized. The pain will subside on its own and sometimes after she uses the BM, she will feel better. She has one BM per day on average, but she will be slightly more irregular when she is out of town. Denies bloating. Endorses excessive flatulence. Denies N&V. Denies dysphagia. She has lost weight deliberately since I last saw her in clinic 2 years ago. Denies chest burning or regurgitation. Denies blood in the stools. Will take an Advil with one dose every few months. She is drinking 2-3 alcoholic beverages per day.  Wt Readings from Last 3 Encounters:  10/13/23 277 lb (125.6 kg)  04/23/23 267 lb 12.8 oz (121.5 kg)  11/19/22 267 lb (121.1 kg)   Past Medical History:  Diagnosis Date   Colon polyps    Diabetes mellitus without complication (HCC)    pre diabetes   GERD (gastroesophageal reflux disease)    Hypertension    Obesity      Past Surgical History:  Procedure Laterality Date   AUGMENTATION MAMMAPLASTY     GASTRIC BYPASS     Family History  Problem Relation Age of Onset   Kidney disease Mother    Hypertension Mother    Heart disease Father    Diabetes Father    Colon cancer Maternal Grandmother    Social History   Tobacco Use   Smoking status: Never   Smokeless tobacco: Never  Vaping Use   Vaping status: Never Used  Substance Use Topics   Alcohol use: Yes    Comment: weekly   Drug use: Never   Current Outpatient Medications  Medication Sig Dispense Refill   amLODipine (NORVASC) 5 MG tablet TAKE 1 TABLET (5 MG TOTAL) BY  MOUTH DAILY. 90 tablet 3   Blood Glucose Monitoring Suppl (FREESTYLE LITE) w/Device KIT 1 each by Does not apply route 3 (three) times daily. DX: E11.65 1 kit 1   Cholecalciferol (VITAMIN D) 125 MCG (5000 UT) CAPS Take 1 capsule by mouth daily at 6 (six) AM.     gabapentin (NEURONTIN) 800 MG tablet Take 1 tablet (800 mg total) by mouth at bedtime. 90 tablet 1   Insulin Pen Needle (NOVOFINE) 30G X 8 MM MISC USE AS DIRECTED WITH SAXENDA 100 each 2   Lancets (FREESTYLE) lancets Use to check blood sugars 3 times a week DX: E11.65 150 each 3   metFORMIN (GLUCOPHAGE) 500 MG tablet Take 1 tablet (500 mg total) by mouth 2 (two) times daily with a meal. 60 tablet 2   telmisartan-hydrochlorothiazide (MICARDIS HCT) 80-25 MG tablet TAKE 1 TABLET BY MOUTH EVERY DAY 90 tablet 3   thiamine (VITAMIN B-1) 100 MG tablet Take 100 mg by mouth daily.     vitamin B-12 (CYANOCOBALAMIN) 500 MCG tablet Take 500 mcg by mouth daily.     No current facility-administered medications for this visit.   No Known Allergies  Physical Exam: BP 118/84   Pulse 84   Ht 5\' 7"  (1.702 m)   Wt 277 lb (125.6 kg)   BMI  43.38 kg/m  Constitutional: Pleasant,well-developed, female in no acute distress. HEENT: Normocephalic and atraumatic. Conjunctivae are normal.  Cardiovascular: Normal rate, regular rhythm.  Pulmonary/chest: Effort normal and breath sounds normal. No wheezing, rales or rhonchi. Abdominal: Soft, nondistended, nontender. Bowel sounds active throughout. Extremities: no edema Neurological: Alert and oriented to person place and time. Skin: Skin is warm and dry. No rashes noted. Psychiatric: Normal mood and affect. Behavior is normal.  Labs 05/01/21: Nml CMP  Labs 09/2022: Cologuard negative  Labs 04/2023: BMP nml. HbA1C 5.9%.  Colonoscopy 06/19/13:  Path: Hyperplastic polyps  Colonoscopy 09/12/21: - Preparation of the colon was poor.  - Stool in the entire examined colon.  - No specimens  collected.  ASSESSMENT AND PLAN:  Generalized ab pain Flatulence GERD History of gastric bypass Patient presents with generalized abdominal pain and excessive flatulence.  Her abdominal pain seems to worsen after eating certain foods and gets better after having a bowel movement or sometimes after waiting for a period of time.  Potential contributors to her symptoms could include PUD, gastritis/duodenitis, marginal ulcer, constipation (her prior colonoscopy had a poor prep), silent reflux, and/or SIBO.  Will plan for further evaluation with labs, SIBO breath test, CT imaging, and EGD.  In the meantime we will have her start the low FODMAP diet to see if this helps with her symptoms.  Patient's last colonoscopy in 2022 did have a poor prep but she was able to complete a Cologuard test in 2023 that was negative. - Low FODMAP diet - Check CBC, TTG IgA, IgA, TSH, CRP - SIBO breath test - Check CT A/P w/contrast - EGD LEC - Try to decrease alcohol use if possible - Next Cologard test due in 09/2025 - RTC in 3 months  [name], MD  I spent 44 minutes of time, including in depth chart review, independent review of results as outlined above, communicating results with the patient directly, face-to-face time with the patient, coordinating care, ordering studies and medications as appropriate, and documentation.

## 2023-10-14 ENCOUNTER — Ambulatory Visit (HOSPITAL_COMMUNITY)
Admission: RE | Admit: 2023-10-14 | Discharge: 2023-10-14 | Disposition: A | Discharge status: Home / Self Care | Payer: 59 | Source: Ambulatory Visit | Attending: Internal Medicine | Admitting: Internal Medicine

## 2023-10-14 DIAGNOSIS — I10 Essential (primary) hypertension: Secondary | ICD-10-CM | POA: Insufficient documentation

## 2023-10-14 DIAGNOSIS — F3289 Other specified depressive episodes: Secondary | ICD-10-CM | POA: Diagnosis not present

## 2023-10-14 DIAGNOSIS — K219 Gastro-esophageal reflux disease without esophagitis: Secondary | ICD-10-CM | POA: Diagnosis not present

## 2023-10-14 DIAGNOSIS — Z9884 Bariatric surgery status: Secondary | ICD-10-CM | POA: Insufficient documentation

## 2023-10-14 DIAGNOSIS — N281 Cyst of kidney, acquired: Secondary | ICD-10-CM | POA: Diagnosis not present

## 2023-10-14 DIAGNOSIS — R143 Flatulence: Secondary | ICD-10-CM | POA: Diagnosis not present

## 2023-10-14 DIAGNOSIS — R109 Unspecified abdominal pain: Secondary | ICD-10-CM | POA: Diagnosis not present

## 2023-10-14 DIAGNOSIS — R1084 Generalized abdominal pain: Secondary | ICD-10-CM | POA: Diagnosis not present

## 2023-10-14 LAB — POCT I-STAT CREATININE: Creatinine, Ser: 0.8 mg/dL (ref 0.44–1.00)

## 2023-10-14 MED ORDER — IOHEXOL 300 MG/ML  SOLN
100.0000 mL | Freq: Once | INTRAMUSCULAR | Status: AC | PRN
  Administered 2023-10-14: 100 mL via INTRAVENOUS

## 2023-10-15 ENCOUNTER — Encounter: Payer: Self-pay | Admitting: Internal Medicine

## 2023-10-21 DIAGNOSIS — F3289 Other specified depressive episodes: Secondary | ICD-10-CM | POA: Diagnosis not present

## 2023-10-27 ENCOUNTER — Telehealth: Payer: Self-pay | Admitting: Internal Medicine

## 2023-10-27 ENCOUNTER — Encounter: Payer: 59 | Admitting: Internal Medicine

## 2023-10-27 NOTE — Progress Notes (Signed)
Madelaine Bhat, CMA,acting as a Neurosurgeon for Arnette Felts, FNP.,have documented all relevant documentation on the behalf of Arnette Felts, FNP,as directed by  Arnette Felts, FNP while in the presence of Arnette Felts, FNP.  Subjective:  Patient ID: Natasha [name] , female    DOB: Apr 24, 1966 , 57 y.o.   MRN: [id number]  Chief Complaint  Patient presents with   Hypertension    HPI  Patient presents today for a bp and pre dm follow up, Patient reports compliance with medication. Patient denies any chest pain, SOB, or headaches. Patient reports she [facility] flu shot at [facility] but is unsure of the date.  She tries to limit her intake of salt. She continues to exercise regularly     Past Medical History:  Diagnosis Date   Colon polyps    Diabetes mellitus without complication (HCC)    pre diabetes   GERD (gastroesophageal reflux disease)    Hypertension    Obesity      Family History  Problem Relation Age of Onset   Kidney disease Mother    Hypertension Mother    Heart disease Father    Diabetes Father    Colon cancer Maternal Grandmother      Current Outpatient Medications:    amLODipine (NORVASC) 5 MG tablet, TAKE 1 TABLET (5 MG TOTAL) BY MOUTH DAILY., Disp: 90 tablet, Rfl: 3   Blood Glucose Monitoring Suppl (FREESTYLE LITE) w/Device KIT, 1 each by Does not apply route 3 (three) times daily. DX: E11.65, Disp: 1 kit, Rfl: 1   Cholecalciferol (VITAMIN D) 125 MCG (5000 UT) CAPS, Take 1 capsule by mouth daily at 6 (six) AM., Disp: , Rfl:    gabapentin (NEURONTIN) 800 MG tablet, Take 1 tablet (800 mg total) by mouth at bedtime., Disp: 90 tablet, Rfl: 1   Insulin Pen Needle (NOVOFINE) 30G X 8 MM MISC, USE AS DIRECTED WITH SAXENDA, Disp: 100 each, Rfl: 2   Lancets (FREESTYLE) lancets, Use to check blood sugars 3 times a week DX: E11.65, Disp: 150 each, Rfl: 3   metFORMIN (GLUCOPHAGE) 500 MG tablet, Take 1 tablet (500 mg total) by mouth 2 (two) times daily with a meal., Disp: 60 tablet, Rfl:  2   telmisartan-hydrochlorothiazide (MICARDIS HCT) 80-25 MG tablet, TAKE 1 TABLET BY MOUTH EVERY DAY, Disp: 90 tablet, Rfl: 3   thiamine (VITAMIN B-1) 100 MG tablet, Take 100 mg by mouth daily., Disp: , Rfl:    tirzepatide (ZEPBOUND) 2.5 MG/0.5ML injection vial, Inject 2.5 mg into the skin once a week., Disp: 2 mL, Rfl: 1   vitamin B-12 (CYANOCOBALAMIN) 500 MCG tablet, Take 500 mcg by mouth daily., Disp: , Rfl:    No Known Allergies   Review of Systems  Constitutional: Negative.   Respiratory: Negative.    Cardiovascular: Negative.   Musculoskeletal: Negative.   Neurological: Negative.   Psychiatric/Behavioral: Negative.       Today's Vitals   10/28/23 1138  BP: 120/80  Pulse: 74  Temp: 97.8 F (36.6 C)  TempSrc: Oral  Weight: 278 lb 6.4 oz (126.3 kg)  Height: 5\' 7"  (1.702 m)  PainSc: 0-No pain   Body mass index is 43.6 kg/m.  Wt Readings from Last 3 Encounters:  10/28/23 278 lb 6.4 oz (126.3 kg)  10/13/23 277 lb (125.6 kg)  04/23/23 267 lb 12.8 oz (121.5 kg)     Objective:  Physical Exam Vitals reviewed.  Constitutional:      General: She is not in acute distress.  Appearance: Normal appearance. She is obese.  Cardiovascular:     Rate and Rhythm: Normal rate and regular rhythm.     Pulses: Normal pulses.     Heart sounds: Normal heart sounds. No murmur heard. Pulmonary:     Effort: Pulmonary effort is normal. No respiratory distress.     Breath sounds: Normal breath sounds. No wheezing.  Skin:    General: Skin is warm and dry.     Capillary Refill: Capillary refill takes less than 2 seconds.     Coloration: Skin is not jaundiced.  Neurological:     General: No focal deficit present.     Mental Status: She is alert and oriented to person, place, and time.     Cranial Nerves: No cranial nerve deficit.     Motor: No weakness.      Assessment And Plan:  Essential hypertension Assessment & Plan: Blood pressure is fairly controlled, continue current  medications.   Orders: -     Basic metabolic panel  Abnormal glucose Assessment & Plan: HgbA1c had improved at last visit. Will check hgbA1c.   Orders: -     Hemoglobin A1c  COVID-19 vaccination declined Assessment & Plan: Declines covid 19 vaccine. Discussed risk of covid 78 and if she changes her mind about the vaccine to call the office. Education has been provided regarding the importance of this vaccine but patient still declined. Advised may receive this vaccine at local pharmacy or Health Dept.or vaccine clinic. Aware to provide a copy of the vaccination record if obtained from local pharmacy or Health Dept.  Encouraged to take multivitamin, vitamin d, vitamin c and zinc to increase immune system. Aware can call office if would like to have vaccine here at office. Verbalized acceptance and understanding.    Class 3 drug-induced obesity with serious comorbidity and body mass index (BMI) of 40.0 to 44.9 in adult [facility]) Assessment & Plan: She is encouraged to strive for BMI less than 30 to decrease cardiac risk. Advised to aim for at least 150 minutes of exercise per week.   Orders: -     Zepbound; Inject 2.5 mg into the skin once a week.  Dispense: 2 mL; Refill: 1   Return for 2 months weight check; 6 month bp check.  Patient was given opportunity to ask questions. Patient verbalized understanding of the plan and was able to repeat key elements of the plan. All questions were answered to their satisfaction.    Jeanell Sparrow, FNP, have reviewed all documentation for this visit. The documentation on 10/28/23 for the exam, diagnosis, procedures, and orders are all accurate and complete.   IF YOU HAVE BEEN REFERRED TO A SPECIALIST, IT MAY TAKE 1-2 WEEKS TO SCHEDULE/PROCESS THE REFERRAL. IF YOU HAVE NOT HEARD FROM US/SPECIALIST IN TWO WEEKS, PLEASE GIVE [place] A CALL AT [phone].

## 2023-10-27 NOTE — Telephone Encounter (Signed)
Hi Dr Leonides Schanz,   I called patient regarding her procedure appointment today, patient stated she got her CT scan, Blood work and the procedure date wrong. However, she texted NO to the automated reminder yesterday around 8 am. Patient was made aware of no show fee and 7 days advance notice for non-emergent cancellation as stated on her procedure instruction sheet. She is requiring a call back to know if she will be charge for the no show. Please advise.   Thank you

## 2023-10-28 ENCOUNTER — Encounter: Payer: Self-pay | Admitting: Nurse Practitioner

## 2023-10-28 ENCOUNTER — Ambulatory Visit: Payer: 59 | Admitting: Nurse Practitioner

## 2023-10-28 VITALS — BP 120/80 | HR 74 | Temp 97.8°F | Ht 67.0 in | Wt 278.4 lb

## 2023-10-28 DIAGNOSIS — Z2821 Immunization not carried out because of patient refusal: Secondary | ICD-10-CM

## 2023-10-28 DIAGNOSIS — F3289 Other specified depressive episodes: Secondary | ICD-10-CM | POA: Diagnosis not present

## 2023-10-28 DIAGNOSIS — R7309 Other abnormal glucose: Secondary | ICD-10-CM | POA: Diagnosis not present

## 2023-10-28 DIAGNOSIS — I10 Essential (primary) hypertension: Secondary | ICD-10-CM | POA: Diagnosis not present

## 2023-10-28 DIAGNOSIS — E66813 Obesity, class 3: Secondary | ICD-10-CM | POA: Diagnosis not present

## 2023-10-28 DIAGNOSIS — E661 Drug-induced obesity: Secondary | ICD-10-CM

## 2023-10-28 DIAGNOSIS — Z6841 Body Mass Index (BMI) 40.0 and over, adult: Secondary | ICD-10-CM

## 2023-10-28 MED ORDER — ZEPBOUND 2.5 MG/0.5ML ~~LOC~~ SOLN: 2.5000 mg | SUBCUTANEOUS | 1 refills | Status: DC

## 2023-11-03 DIAGNOSIS — Z2821 Immunization not carried out because of patient refusal: Secondary | ICD-10-CM | POA: Insufficient documentation

## 2023-11-03 NOTE — Assessment & Plan Note (Signed)
HgbA1c had improved at last visit. Will check hgbA1c.

## 2023-11-03 NOTE — Assessment & Plan Note (Signed)

## 2023-11-03 NOTE — Assessment & Plan Note (Signed)
Blood pressure is fairly controlled, continue current medications.

## 2023-11-03 NOTE — Assessment & Plan Note (Signed)
She is encouraged to strive for BMI less than 30 to decrease cardiac risk. Advised to aim for at least 150 minutes of exercise per week.

## 2023-11-11 DIAGNOSIS — F3289 Other specified depressive episodes: Secondary | ICD-10-CM | POA: Diagnosis not present

## 2023-11-18 DIAGNOSIS — F3289 Other specified depressive episodes: Secondary | ICD-10-CM | POA: Diagnosis not present

## 2023-11-25 DIAGNOSIS — F3289 Other specified depressive episodes: Secondary | ICD-10-CM | POA: Diagnosis not present

## 2023-12-02 DIAGNOSIS — F3289 Other specified depressive episodes: Secondary | ICD-10-CM | POA: Diagnosis not present

## 2023-12-09 DIAGNOSIS — F3289 Other specified depressive episodes: Secondary | ICD-10-CM | POA: Diagnosis not present

## 2023-12-11 ENCOUNTER — Other Ambulatory Visit (HOSPITAL_BASED_OUTPATIENT_CLINIC_OR_DEPARTMENT_OTHER): Payer: Self-pay

## 2023-12-12 ENCOUNTER — Other Ambulatory Visit: Payer: Self-pay | Admitting: Internal Medicine

## 2023-12-12 DIAGNOSIS — I158 Other secondary hypertension: Secondary | ICD-10-CM

## 2023-12-13 ENCOUNTER — Other Ambulatory Visit (HOSPITAL_BASED_OUTPATIENT_CLINIC_OR_DEPARTMENT_OTHER): Payer: Self-pay

## 2023-12-13 MED ORDER — GABAPENTIN 800 MG PO TABS
800.0000 mg | ORAL_TABLET | Freq: Every day | ORAL | 1 refills | Status: DC
  Filled 2023-12-13: qty 90, 90d supply, fill #0

## 2023-12-15 DIAGNOSIS — F3289 Other specified depressive episodes: Secondary | ICD-10-CM | POA: Diagnosis not present

## 2023-12-16 DIAGNOSIS — F1020 Alcohol dependence, uncomplicated: Secondary | ICD-10-CM | POA: Diagnosis not present

## 2023-12-17 DIAGNOSIS — F1020 Alcohol dependence, uncomplicated: Secondary | ICD-10-CM | POA: Diagnosis not present

## 2023-12-20 DIAGNOSIS — F1020 Alcohol dependence, uncomplicated: Secondary | ICD-10-CM | POA: Diagnosis not present

## 2023-12-22 DIAGNOSIS — F1020 Alcohol dependence, uncomplicated: Secondary | ICD-10-CM | POA: Diagnosis not present

## 2023-12-23 DIAGNOSIS — F331 Major depressive disorder, recurrent, moderate: Secondary | ICD-10-CM | POA: Diagnosis not present

## 2023-12-23 DIAGNOSIS — F411 Generalized anxiety disorder: Secondary | ICD-10-CM | POA: Diagnosis not present

## 2023-12-23 DIAGNOSIS — F5101 Primary insomnia: Secondary | ICD-10-CM | POA: Diagnosis not present

## 2023-12-23 DIAGNOSIS — F1020 Alcohol dependence, uncomplicated: Secondary | ICD-10-CM | POA: Diagnosis not present

## 2024-01-05 ENCOUNTER — Ambulatory Visit: Payer: 59 | Admitting: Nurse Practitioner

## 2024-01-06 ENCOUNTER — Other Ambulatory Visit: Payer: Self-pay | Admitting: Internal Medicine

## 2024-01-06 DIAGNOSIS — F3289 Other specified depressive episodes: Secondary | ICD-10-CM | POA: Diagnosis not present

## 2024-01-06 DIAGNOSIS — I158 Other secondary hypertension: Secondary | ICD-10-CM

## 2024-01-08 DIAGNOSIS — F3289 Other specified depressive episodes: Secondary | ICD-10-CM | POA: Diagnosis not present

## 2024-01-13 DIAGNOSIS — F3289 Other specified depressive episodes: Secondary | ICD-10-CM | POA: Diagnosis not present

## 2024-01-15 ENCOUNTER — Other Ambulatory Visit: Payer: Self-pay | Admitting: Internal Medicine

## 2024-01-15 DIAGNOSIS — I158 Other secondary hypertension: Secondary | ICD-10-CM

## 2024-01-15 DIAGNOSIS — F3289 Other specified depressive episodes: Secondary | ICD-10-CM | POA: Diagnosis not present

## 2024-01-20 DIAGNOSIS — F3289 Other specified depressive episodes: Secondary | ICD-10-CM | POA: Diagnosis not present

## 2024-01-27 DIAGNOSIS — F3289 Other specified depressive episodes: Secondary | ICD-10-CM | POA: Diagnosis not present

## 2024-01-29 DIAGNOSIS — F3289 Other specified depressive episodes: Secondary | ICD-10-CM | POA: Diagnosis not present

## 2024-02-03 DIAGNOSIS — F3289 Other specified depressive episodes: Secondary | ICD-10-CM | POA: Diagnosis not present

## 2024-02-10 DIAGNOSIS — F3289 Other specified depressive episodes: Secondary | ICD-10-CM | POA: Diagnosis not present

## 2024-02-12 DIAGNOSIS — F3289 Other specified depressive episodes: Secondary | ICD-10-CM | POA: Diagnosis not present

## 2024-02-15 ENCOUNTER — Other Ambulatory Visit: Payer: Self-pay | Admitting: Internal Medicine

## 2024-02-15 DIAGNOSIS — I158 Other secondary hypertension: Secondary | ICD-10-CM

## 2024-02-17 DIAGNOSIS — F3289 Other specified depressive episodes: Secondary | ICD-10-CM | POA: Diagnosis not present

## 2024-02-19 DIAGNOSIS — F3289 Other specified depressive episodes: Secondary | ICD-10-CM | POA: Diagnosis not present

## 2024-02-23 ENCOUNTER — Encounter (HOSPITAL_COMMUNITY): Payer: Self-pay

## 2024-02-23 ENCOUNTER — Other Ambulatory Visit (HOSPITAL_COMMUNITY): Payer: Self-pay

## 2024-02-23 ENCOUNTER — Ambulatory Visit: Payer: Self-pay | Admitting: Nurse Practitioner

## 2024-02-23 VITALS — BP 112/72 | HR 74 | Temp 98.1°F | Ht 67.0 in | Wt 301.2 lb

## 2024-02-23 DIAGNOSIS — R7309 Other abnormal glucose: Secondary | ICD-10-CM | POA: Diagnosis not present

## 2024-02-23 DIAGNOSIS — E66813 Obesity, class 3: Secondary | ICD-10-CM | POA: Diagnosis not present

## 2024-02-23 DIAGNOSIS — G4709 Other insomnia: Secondary | ICD-10-CM

## 2024-02-23 DIAGNOSIS — F419 Anxiety disorder, unspecified: Secondary | ICD-10-CM | POA: Diagnosis not present

## 2024-02-23 DIAGNOSIS — Z2821 Immunization not carried out because of patient refusal: Secondary | ICD-10-CM

## 2024-02-23 DIAGNOSIS — Z6841 Body Mass Index (BMI) 40.0 and over, adult: Secondary | ICD-10-CM

## 2024-02-23 DIAGNOSIS — I10 Essential (primary) hypertension: Secondary | ICD-10-CM

## 2024-02-23 MED ORDER — GABAPENTIN 300 MG PO CAPS
300.0000 mg | ORAL_CAPSULE | Freq: Three times a day (TID) | ORAL | 2 refills | Status: AC
  Filled 2024-02-23 – 2024-03-01 (×3): qty 90, 30d supply, fill #0
  Filled 2024-04-01: qty 90, 30d supply, fill #1
  Filled 2024-08-02: qty 90, 30d supply, fill #0

## 2024-02-23 MED ORDER — WEGOVY 0.5 MG/0.5ML ~~LOC~~ SOAJ
0.5000 mg | SUBCUTANEOUS | 1 refills | Status: AC
  Filled 2024-02-23 – 2024-02-24 (×2): qty 2, 28d supply, fill #0

## 2024-02-23 MED ORDER — HYDROXYZINE HCL 25 MG PO TABS
25.0000 mg | ORAL_TABLET | Freq: Three times a day (TID) | ORAL | 0 refills | Status: DC | PRN
  Filled 2024-02-23: qty 30, 10d supply, fill #0

## 2024-02-23 NOTE — Progress Notes (Signed)
 Madelaine Bhat, CMA,acting as a Neurosurgeon for Arnette Felts, FNP.,have documented all relevant documentation on the behalf of Arnette Felts, FNP,as directed by  Arnette Felts, FNP while in the presence of Arnette Felts, FNP.  Subjective:  Patient ID: Natasha [name] , female    DOB: 30-Apr-1966 , 58 y.o.   MRN: [id number]  No chief complaint on file.   HPI  Patient presents today for a bp and pre dm follow up, Patient reports compliance with medication. Patient denies any chest pain, SOB, or headaches. Patient would like to discuss weight loss options.  She is taking melatonin, magnesium, hydroxyzine and gabapentin in addition to therapy. She has been able to sleep better and the gabapentin is helping with her hot flashes. She has struggled over the winter. She is not drinking anymore and [facility]  She is planning to start work part time.     Past Medical History:  Diagnosis Date   Colon polyps    Diabetes mellitus without complication (HCC)    pre diabetes   GERD (gastroesophageal reflux disease)    Hypertension    Obesity      Family History  Problem Relation Age of Onset   Kidney disease Mother    Hypertension Mother    Heart disease Father    Diabetes Father    Colon cancer Maternal Grandmother      Current Outpatient Medications:    amLODipine (NORVASC) 5 MG tablet, TAKE 1 TABLET BY MOUTH DAILY. PT. MUST MAKE AN APPOINTMENT IN ORDER TO RECEIVE FUTURE REFILLS, Disp: 90 tablet, Rfl: 1   Blood Glucose Monitoring Suppl (FREESTYLE LITE) w/Device KIT, 1 each by Does not apply route 3 (three) times daily. DX: E11.65, Disp: 1 kit, Rfl: 1   gabapentin (NEURONTIN) 300 MG capsule, Take 1 capsule (300 mg total) by mouth 3 (three) times daily., Disp: 90 capsule, Rfl: 2   gabapentin (NEURONTIN) 800 MG tablet, Take 1 tablet (800 mg total) by mouth at bedtime., Disp: 90 tablet, Rfl: 1   hydrOXYzine (ATARAX) 25 MG tablet, Take 1 tablet (25 mg total) by mouth 3 (three) times daily as  needed., Disp: 30 tablet, Rfl: 0   Insulin Pen Needle (NOVOFINE) 30G X 8 MM MISC, USE AS DIRECTED WITH SAXENDA, Disp: 100 each, Rfl: 2   Lancets (FREESTYLE) lancets, Use to check blood sugars 3 times a week DX: E11.65, Disp: 150 each, Rfl: 3   metFORMIN (GLUCOPHAGE) 500 MG tablet, Take 1 tablet (500 mg total) by mouth 2 (two) times daily with a meal., Disp: 60 tablet, Rfl: 2   Semaglutide-Weight Management (WEGOVY) 0.5 MG/0.5ML SOAJ, Inject 0.5 mg into the skin once a week., Disp: 2 mL, Rfl: 1   telmisartan-hydrochlorothiazide (MICARDIS HCT) 80-25 MG tablet, TAKE 1 TABLET BY MOUTH EVERY DAY, Disp: 90 tablet, Rfl: 3   thiamine (VITAMIN B-1) 100 MG tablet, Take 100 mg by mouth daily., Disp: , Rfl:    vitamin B-12 (CYANOCOBALAMIN) 500 MCG tablet, Take 500 mcg by mouth daily., Disp: , Rfl:    No Known Allergies   Review of Systems  Constitutional: Negative.   Respiratory: Negative.    Cardiovascular: Negative.   Neurological: Negative.   Psychiatric/Behavioral: Negative.       Today's Vitals   02/23/24 1451  BP: 112/72  Pulse: 74  Temp: 98.1 F (36.7 C)  TempSrc: Oral  Weight: (!) 301 lb 3.2 oz (136.6 kg)  Height: 5\' 7"  (1.702 m)  PainSc: 0-No pain  Body mass index is 47.17 kg/m.  Wt Readings from Last 3 Encounters:  02/23/24 (!) 301 lb 3.2 oz (136.6 kg)  10/28/23 278 lb 6.4 oz (126.3 kg)  10/13/23 277 lb (125.6 kg)    Objective:  Physical Exam Vitals and nursing note reviewed.  Constitutional:      General: She is not in acute distress.    Appearance: Normal appearance. She is obese.  Cardiovascular:     Rate and Rhythm: Normal rate and regular rhythm.     Pulses: Normal pulses.     Heart sounds: Normal heart sounds. No murmur heard. Pulmonary:     Effort: Pulmonary effort is normal. No respiratory distress.     Breath sounds: Normal breath sounds. No wheezing.  Skin:    General: Skin is warm and dry.     Capillary Refill: Capillary refill takes less than 2 seconds.      Coloration: Skin is not jaundiced.  Neurological:     General: No focal deficit present.     Mental Status: She is alert and oriented to person, place, and time.     Cranial Nerves: No cranial nerve deficit.     Motor: No weakness.      Assessment And Plan:  Essential hypertension Assessment & Plan: Blood pressure is well controlled, continue current medications.   Orders: -     BMP8+eGFR  Abnormal glucose Assessment & Plan: Stable. Encouraged to focus on healthy diet and regular exercise.    Anxiety Assessment & Plan: She is doing better since going to inpatient rehab.  Continue hydroxyzine   Orders: -     hydrOXYzine HCl; Take 1 tablet (25 mg total) by mouth 3 (three) times daily as needed.  Dispense: 30 tablet; Refill: 0 -     Gabapentin; Take 1 capsule (300 mg total) by mouth 3 (three) times daily.  Dispense: 90 capsule; Refill: 2  Other insomnia -     Gabapentin; Take 1 capsule (300 mg total) by mouth 3 (three) times daily.  Dispense: 90 capsule; Refill: 2  COVID-19 vaccination declined Assessment & Plan: Declines covid 19 vaccine. Discussed risk of covid 48 and if she changes her mind about the vaccine to call the office. Education has been provided regarding the importance of this vaccine but patient still declined. Advised may receive this vaccine at local pharmacy or Health Dept.or vaccine clinic. Aware to provide a copy of the vaccination record if obtained from local pharmacy or Health Dept.  Encouraged to take multivitamin, vitamin d, vitamin c and zinc to increase immune system. Aware can call office if would like to have vaccine here at office. Verbalized acceptance and understanding.    Class 3 severe obesity due to excess calories with serious comorbidity and body mass index (BMI) of 45.0 to 49.9 in adult [facility]) Assessment & Plan: Her weight has increased she has not been taking the [facility] since her insurance was no longer covering. She would like to  restart, aware there is a cash pay option. Hopefully with the warmer weather she will get out and exercise more.    Other orders -     Wegovy; Inject 0.5 mg into the skin once a week.  Dispense: 2 mL; Refill: 1    Return for 2 months weight check, 6 month bp check.  Patient was given opportunity to ask questions. Patient verbalized understanding of the plan and was able to repeat key elements of the plan. All questions were answered to their satisfaction.  Jeanell Sparrow, FNP, have reviewed all documentation for this visit. The documentation on 02/23/24 for the exam, diagnosis, procedures, and orders are all accurate and complete.   IF YOU HAVE BEEN REFERRED TO A SPECIALIST, IT MAY TAKE 1-2 WEEKS TO SCHEDULE/PROCESS THE REFERRAL. IF YOU HAVE NOT HEARD FROM US/SPECIALIST IN TWO WEEKS, PLEASE GIVE [place] A CALL AT [phone].

## 2024-02-24 ENCOUNTER — Other Ambulatory Visit (HOSPITAL_COMMUNITY): Payer: Self-pay

## 2024-02-24 ENCOUNTER — Encounter: Payer: Self-pay | Admitting: Nurse Practitioner

## 2024-02-24 DIAGNOSIS — F3289 Other specified depressive episodes: Secondary | ICD-10-CM | POA: Diagnosis not present

## 2024-02-24 LAB — BMP8+EGFR
BUN/Creatinine Ratio: 20 (ref 9–23)
BUN: 16 mg/dL (ref 6–24)
CO2: 23 mmol/L (ref 20–29)
Calcium: 9.6 mg/dL (ref 8.7–10.2)
Chloride: 102 mmol/L (ref 96–106)
Creatinine, Ser: 0.82 mg/dL (ref 0.57–1.00)
Glucose: 88 mg/dL (ref 70–99)
Potassium: 4.3 mmol/L (ref 3.5–5.2)
Sodium: 140 mmol/L (ref 134–144)
eGFR: 84 mL/min/{1.73_m2} (ref >59)

## 2024-03-01 ENCOUNTER — Other Ambulatory Visit (HOSPITAL_COMMUNITY): Payer: Self-pay

## 2024-03-02 DIAGNOSIS — F3289 Other specified depressive episodes: Secondary | ICD-10-CM | POA: Diagnosis not present

## 2024-03-02 DIAGNOSIS — F1020 Alcohol dependence, uncomplicated: Secondary | ICD-10-CM | POA: Diagnosis not present

## 2024-03-05 DIAGNOSIS — G4709 Other insomnia: Secondary | ICD-10-CM | POA: Insufficient documentation

## 2024-03-05 DIAGNOSIS — F419 Anxiety disorder, unspecified: Secondary | ICD-10-CM | POA: Insufficient documentation

## 2024-03-05 DIAGNOSIS — E66813 Obesity, class 3: Secondary | ICD-10-CM | POA: Insufficient documentation

## 2024-03-05 NOTE — Assessment & Plan Note (Signed)
 Stable. Encouraged to focus on healthy diet and regular exercise.

## 2024-03-05 NOTE — Assessment & Plan Note (Addendum)
 She is doing better since going to inpatient rehab.  Continue hydroxyzine

## 2024-03-05 NOTE — Assessment & Plan Note (Signed)

## 2024-03-05 NOTE — Assessment & Plan Note (Signed)
 Blood pressure is well controlled, continue current medications.

## 2024-03-05 NOTE — Assessment & Plan Note (Signed)
 Her weight has increased she has not been taking the [facility] since her insurance was no longer covering. She would like to restart, aware there is a cash pay option. Hopefully with the warmer weather she will get out and exercise more.

## 2024-03-09 ENCOUNTER — Other Ambulatory Visit (HOSPITAL_COMMUNITY): Payer: Self-pay

## 2024-03-09 DIAGNOSIS — F3289 Other specified depressive episodes: Secondary | ICD-10-CM | POA: Diagnosis not present

## 2024-03-09 DIAGNOSIS — F1020 Alcohol dependence, uncomplicated: Secondary | ICD-10-CM | POA: Diagnosis not present

## 2024-03-16 DIAGNOSIS — F3289 Other specified depressive episodes: Secondary | ICD-10-CM | POA: Diagnosis not present

## 2024-03-16 DIAGNOSIS — F1020 Alcohol dependence, uncomplicated: Secondary | ICD-10-CM | POA: Diagnosis not present

## 2024-03-23 DIAGNOSIS — F3289 Other specified depressive episodes: Secondary | ICD-10-CM | POA: Diagnosis not present

## 2024-03-23 DIAGNOSIS — F1020 Alcohol dependence, uncomplicated: Secondary | ICD-10-CM | POA: Diagnosis not present

## 2024-03-30 DIAGNOSIS — F1020 Alcohol dependence, uncomplicated: Secondary | ICD-10-CM | POA: Diagnosis not present

## 2024-03-30 DIAGNOSIS — F3289 Other specified depressive episodes: Secondary | ICD-10-CM | POA: Diagnosis not present

## 2024-04-01 ENCOUNTER — Other Ambulatory Visit: Payer: Self-pay | Admitting: Nurse Practitioner

## 2024-04-01 DIAGNOSIS — R7309 Other abnormal glucose: Secondary | ICD-10-CM

## 2024-04-03 ENCOUNTER — Other Ambulatory Visit (HOSPITAL_COMMUNITY): Payer: Self-pay

## 2024-04-03 ENCOUNTER — Other Ambulatory Visit: Payer: Self-pay

## 2024-04-03 MED ORDER — METFORMIN HCL 500 MG PO TABS
500.0000 mg | ORAL_TABLET | Freq: Two times a day (BID) | ORAL | 2 refills | Status: DC
  Filled 2024-04-03 – 2024-05-03 (×2): qty 60, 30d supply, fill #0
  Filled 2024-05-03 – 2024-08-18 (×2): qty 60, 30d supply, fill #1

## 2024-04-06 DIAGNOSIS — F1020 Alcohol dependence, uncomplicated: Secondary | ICD-10-CM | POA: Diagnosis not present

## 2024-04-06 DIAGNOSIS — F3289 Other specified depressive episodes: Secondary | ICD-10-CM | POA: Diagnosis not present

## 2024-04-07 ENCOUNTER — Encounter: Payer: Self-pay | Admitting: Nurse Practitioner

## 2024-04-07 ENCOUNTER — Ambulatory Visit: Payer: Worker's Compensation | Attending: Nurse Practitioner | Admitting: Nurse Practitioner

## 2024-04-07 VITALS — BP 108/64 | HR 70 | Ht 67.0 in | Wt 291.2 lb

## 2024-04-07 DIAGNOSIS — E66813 Obesity, class 3: Secondary | ICD-10-CM

## 2024-04-07 DIAGNOSIS — R7303 Prediabetes: Secondary | ICD-10-CM

## 2024-04-07 DIAGNOSIS — Z87898 Personal history of other specified conditions: Secondary | ICD-10-CM

## 2024-04-07 DIAGNOSIS — Z6841 Body Mass Index (BMI) 40.0 and over, adult: Secondary | ICD-10-CM

## 2024-04-07 DIAGNOSIS — I10 Essential (primary) hypertension: Secondary | ICD-10-CM

## 2024-04-07 NOTE — Progress Notes (Unsigned)
 Office Visit    Patient Name: Natasha [name] Date of Encounter: 04/07/2024  Primary Care Provider:  Susanna Epley, FNP Primary Cardiologist:  Kardie Tobb, DO  Chief Complaint    58 year old female with a history of hypertension, prediabetes, obesity, GERD, anxiety and depression who presents for follow-up related to hypertension.  Past Medical History    Past Medical History:  Diagnosis Date   Colon polyps    Diabetes mellitus without complication (HCC)    pre diabetes   GERD (gastroesophageal reflux disease)    Hypertension    Obesity    Past Surgical History:  Procedure Laterality Date   AUGMENTATION MAMMAPLASTY     GASTRIC BYPASS      Allergies  No Known Allergies   Labs/Other Studies Reviewed    The following studies were reviewed today:     Recent Labs: 02/23/2024: BUN 16; Creatinine, Ser 0.82; Potassium 4.3; Sodium 140  Recent Lipid Panel No results found for: "CHOL", "TRIG", "HDL", "CHOLHDL", "VLDL", "LDLCALC", "LDLDIRECT"  History of Present Illness    58 year old female with the above past medical history including hypertension, prediabetes, obesity, GERD, anxiety and depression.  She was referred to cardiology in the setting of hypertension.  Prior echocardiogram per prior notes was stable.  She was last seen in the office on 09/03/2022 and was doing well from a cardiac standpoint.  She presents today for follow-up.  Since her last visit she has been stable from a cardiac standpoint.  She denies any symptoms concerning for angina.  She is walking regularly for exercise.  She recently started taking Wegovy  for weight loss and is tolerating this well.  BP has been well-controlled.  Overall, she reports feeling well.   Home Medications    Current Outpatient Medications  Medication Sig Dispense Refill   amLODipine  (NORVASC ) 5 MG tablet TAKE 1 TABLET BY MOUTH DAILY. PT. MUST MAKE AN APPOINTMENT IN ORDER TO RECEIVE FUTURE REFILLS 90 tablet 1   Blood Glucose  Monitoring Suppl (FREESTYLE LITE) w/Device KIT 1 each by Does not apply route 3 (three) times daily. DX: E11.65 1 kit 1   gabapentin  (NEURONTIN ) 300 MG capsule Take 1 capsule (300 mg total) by mouth 3 (three) times daily. 90 capsule 2   hydrOXYzine  (ATARAX ) 25 MG tablet Take 1 tablet (25 mg total) by mouth 3 (three) times daily as needed. 30 tablet 0   Insulin  Pen Needle (NOVOFINE) 30G X 8 MM MISC USE AS DIRECTED WITH SAXENDA  100 each 2   Lancets (FREESTYLE) lancets Use to check blood sugars 3 times a week DX: E11.65 150 each 3   metFORMIN  (GLUCOPHAGE ) 500 MG tablet Take 1 tablet (500 mg total) by mouth 2 (two) times daily with a meal. 60 tablet 2   Semaglutide -Weight Management (WEGOVY ) 0.5 MG/0.5ML SOAJ Inject 0.5 mg into the skin once a week. 2 mL 1   telmisartan -hydrochlorothiazide  (MICARDIS  HCT) 80-25 MG tablet TAKE 1 TABLET BY MOUTH EVERY DAY 90 tablet 3   thiamine (VITAMIN B-1) 100 MG tablet Take 100 mg by mouth daily.     vitamin B-12 (CYANOCOBALAMIN) 500 MCG tablet Take 500 mcg by mouth daily.     gabapentin  (NEURONTIN ) 800 MG tablet Take 1 tablet (800 mg total) by mouth at bedtime. 90 tablet 1   No current facility-administered medications for this visit.     Review of Systems    She denies chest pain, palpitations, dyspnea, pnd, orthopnea, n, v, dizziness, syncope, edema, weight gain, or early satiety. All  other systems reviewed and are otherwise negative except as noted above.   Physical Exam    VS:  BP 108/64   Pulse 70   Ht 5\' 7"  (1.702 m)   Wt 291 lb 3.2 oz (132.1 kg)   SpO2 98%   BMI 45.61 kg/m  GEN: Well nourished, well developed, in no acute distress. HEENT: normal. Neck: Supple, no JVD, carotid bruits, or masses. Cardiac: RRR, no murmurs, rubs, or gallops. No clubbing, cyanosis, edema.  Radials/DP/PT 2+ and equal bilaterally.  Respiratory:  Respirations regular and unlabored, clear to auscultation bilaterally. GI: Soft, nontender, nondistended, BS + x 4. MS: no  deformity or atrophy. Skin: warm and dry, no rash. Neuro:  Strength and sensation are intact. Psych: Normal affect.  Accessory Clinical Findings    ECG personally reviewed by me today - EKG Interpretation Date/Time:  Friday Apr 07 2024 13:33:24 EDT Ventricular Rate:  70 PR Interval:  202 QRS Duration:  96 QT Interval:  398 QTC Calculation: 429 R Axis:   -3  Text Interpretation: Normal sinus rhythm Possible Left atrial enlargement Incomplete right bundle branch block No previous ECGs available Confirmed by Marlana Silvan ([id number]) on 04/07/2024 1:42:48 PM  - no acute changes.   No results found for: "WBC", "HGB", "HCT", "MCV", "PLT" Lab Results  Component Value Date   CREATININE 0.82 02/23/2024   BUN 16 02/23/2024   NA 140 02/23/2024   K 4.3 02/23/2024   CL 102 02/23/2024   CO2 23 02/23/2024   Lab Results  Component Value Date   ALT 14 12/30/2021   AST 22 12/30/2021   ALKPHOS 114 12/30/2021   BILITOT 0.3 12/30/2021   No results found for: "CHOL", "HDL", "LDLCALC", "LDLDIRECT", "TRIG", "CHOLHDL"  Lab Results  Component Value Date   HGBA1C 5.9 (H) 04/23/2023    Assessment & Plan    1. Hypertension: BP well controlled. Continue current antihypertensive regimen.   2. History of cardiac murmur: History of cardiac murmur as a child, no murmur appreciated on exam today. Prior echocardiogram per notes was stable.  No indication for repeat echocardiogram at this time, consider repeat echo as clinically indicated.   3. Prediabetes: A1c was 5.9 04/2023.  Continue lifestyle modifications with diet and exercise.  4. Obesity: Recently started on Wegovy , tolerating well.  Per PCP.  5. Disposition: She will plan to establish with Dr. Emmette Harms, follow-up in 1 year, sooner if needed.      Jude Norton, NP 04/07/2024, 2:09 PM

## 2024-04-07 NOTE — Patient Instructions (Signed)
 Medication Instructions:  Your physician recommends that you continue on your current medications as directed. Please refer to the Current Medication list given to you today.  *If you need a refill on your cardiac medications before your next appointment, please call your pharmacy*  Lab Work: NONE ordered at this time of appointment   Testing/Procedures: NONE ordered at this time of appointment    Follow-Up: At [facility], you and your health needs are our priority.  As part of our continuing mission to provide you with exceptional heart care, our providers are all part of one team.  This team includes your primary Cardiologist (physician) and Advanced Practice Providers or APPs (Physician Assistants and Nurse Practitioners) who all work together to provide you with the care you need, when you need it.  Your next appointment:   1 year(s)  Provider:   Dr. Emmette Harms    We recommend signing up for the patient portal called "MyChart".  Sign up information is provided on this After Visit Summary.  MyChart is used to connect with patients for Virtual Visits (Telemedicine).  Patients are able to view lab/test results, encounter notes, upcoming appointments, etc.  Non-urgent messages can be sent to your provider as well.   To learn more about what you can do with MyChart, go to [email or web address].

## 2024-04-09 ENCOUNTER — Encounter: Payer: Self-pay | Admitting: Nurse Practitioner

## 2024-04-20 ENCOUNTER — Other Ambulatory Visit: Payer: Self-pay

## 2024-04-20 ENCOUNTER — Other Ambulatory Visit: Payer: Self-pay | Admitting: Nurse Practitioner

## 2024-04-20 ENCOUNTER — Encounter: Payer: Self-pay | Admitting: Nurse Practitioner

## 2024-04-20 MED ORDER — WEGOVY 1 MG/0.5ML ~~LOC~~ SOAJ: 1.0000 mg | SUBCUTANEOUS | 1 refills | Status: DC

## 2024-04-21 ENCOUNTER — Other Ambulatory Visit (HOSPITAL_COMMUNITY): Payer: Self-pay

## 2024-04-21 ENCOUNTER — Other Ambulatory Visit: Payer: Self-pay

## 2024-04-21 MED ORDER — WEGOVY 1 MG/0.5ML ~~LOC~~ SOAJ
1.0000 mg | SUBCUTANEOUS | 1 refills | Status: AC
  Filled 2024-04-21: qty 2, 28d supply, fill #0

## 2024-04-27 ENCOUNTER — Ambulatory Visit: Admitting: Nurse Practitioner

## 2024-04-27 DIAGNOSIS — F3289 Other specified depressive episodes: Secondary | ICD-10-CM | POA: Diagnosis not present

## 2024-04-27 DIAGNOSIS — F1020 Alcohol dependence, uncomplicated: Secondary | ICD-10-CM | POA: Diagnosis not present

## 2024-05-03 ENCOUNTER — Encounter (HOSPITAL_BASED_OUTPATIENT_CLINIC_OR_DEPARTMENT_OTHER): Payer: Self-pay | Admitting: Orthopaedic Surgery

## 2024-05-03 ENCOUNTER — Other Ambulatory Visit (HOSPITAL_COMMUNITY): Payer: Self-pay

## 2024-05-03 ENCOUNTER — Ambulatory Visit (HOSPITAL_BASED_OUTPATIENT_CLINIC_OR_DEPARTMENT_OTHER): Admitting: Orthopaedic Surgery

## 2024-05-03 ENCOUNTER — Ambulatory Visit (HOSPITAL_BASED_OUTPATIENT_CLINIC_OR_DEPARTMENT_OTHER)

## 2024-05-03 ENCOUNTER — Other Ambulatory Visit (HOSPITAL_BASED_OUTPATIENT_CLINIC_OR_DEPARTMENT_OTHER): Payer: Self-pay

## 2024-05-03 DIAGNOSIS — M19011 Primary osteoarthritis, right shoulder: Secondary | ICD-10-CM | POA: Diagnosis not present

## 2024-05-03 DIAGNOSIS — M25511 Pain in right shoulder: Secondary | ICD-10-CM | POA: Diagnosis not present

## 2024-05-03 DIAGNOSIS — G8929 Other chronic pain: Secondary | ICD-10-CM

## 2024-05-03 DIAGNOSIS — M25551 Pain in right hip: Secondary | ICD-10-CM

## 2024-05-03 DIAGNOSIS — M25552 Pain in left hip: Secondary | ICD-10-CM | POA: Diagnosis not present

## 2024-05-03 DIAGNOSIS — M160 Bilateral primary osteoarthritis of hip: Secondary | ICD-10-CM | POA: Diagnosis not present

## 2024-05-03 MED ORDER — CYCLOBENZAPRINE HCL 10 MG PO TABS
10.0000 mg | ORAL_TABLET | Freq: Three times a day (TID) | ORAL | 0 refills | Status: AC | PRN
  Filled 2024-05-03 – 2024-07-05 (×3): qty 30, 10d supply, fill #0

## 2024-05-03 MED ORDER — MELOXICAM 15 MG PO TABS
ORAL_TABLET | ORAL | 0 refills | Status: DC
Start: 2024-05-03 — End: 2024-07-05
  Filled 2024-05-03: qty 10, 10d supply, fill #0

## 2024-05-03 NOTE — Progress Notes (Signed)
 Chief Complaint: Lateral hip pain, right shoulder pain     History of Present Illness:    Natasha [name] is a 58 y.o. female with a history of bilateral lateral based deep hip pain.  She is experiencing some pain in the groin.  Her predominant pain is with rolling over onto the side.  She is having a hard time getting comfortable neither direction.  She has not previously had injections.  With regard to the right shoulder she is experiencing pain in the posterior border of the scapula.  There is pain with overhead motion again in the posterior shoulder as well as the trapezius    PMH/PSH/Family History/Social History/Meds/Allergies:    Past Medical History:  Diagnosis Date   Colon polyps    Diabetes mellitus without complication (HCC)    pre diabetes   GERD (gastroesophageal reflux disease)    Hypertension    Obesity    Past Surgical History:  Procedure Laterality Date   AUGMENTATION MAMMAPLASTY     GASTRIC BYPASS     Social History   Socioeconomic History   Marital status: Single    Spouse name: Not on file   Number of children: Not on file   Years of education: Not on file   Highest education level: Bachelor's degree (e.g., BA, AB, BS)  Occupational History   Not on file  Tobacco Use   Smoking status: Never   Smokeless tobacco: Never  Vaping Use   Vaping status: Never Used  Substance and Sexual Activity   Alcohol  use: Yes    Comment: weekly   Drug use: Never   Sexual activity: Not on file  Other Topics Concern   Not on file  Social History Narrative   Not on file   Social Drivers of Health   Financial Resource Strain: Low Risk  (02/23/2024)   Overall Financial Resource Strain (CARDIA)    Difficulty of Paying Living Expenses: Not hard at all  Food Insecurity: No Food Insecurity (02/23/2024)   Hunger Vital Sign    Worried About Running Out of Food in the Last Year: Never true    Ran Out of Food in the Last Year: Never true  Transportation Needs: No  Transportation Needs (02/23/2024)   PRAPARE - Administrator, Civil Service (Medical): No    Lack of Transportation (Non-Medical): No  Physical Activity: Inactive (02/23/2024)   Exercise Vital Sign    Days of Exercise per Week: 0 days    Minutes of Exercise per Session: 30 min  Stress: Stress Concern Present (02/23/2024)   [employer] of Occupational Health - Occupational Stress Questionnaire    Feeling of Stress : To some extent  Social Connections: Moderately Isolated (02/23/2024)   Social Connection and Isolation Panel [NHANES]    Frequency of Communication with Friends and Family: More than three times a week    Frequency of Social Gatherings with Friends and Family: Three times a week    Attends Religious Services: Never    Active Member of Clubs or Organizations: No    Attends Engineer, structural: Not on file    Marital Status: Living with partner   Family History  Problem Relation Age of Onset   Kidney disease Mother    Hypertension Mother    Heart disease Father    Diabetes Father    Colon cancer Maternal Grandmother    No Known Allergies Current Outpatient Medications  Medication Sig Dispense Refill   cyclobenzaprine (FLEXERIL)  10 MG tablet Take 1 tablet (10 mg total) by mouth 3 (three) times daily as needed for muscle spasms. 30 tablet 0   meloxicam (MOBIC) 15 MG tablet Take 1 tablet daily with food for 7 days. Then take as needed. 10 tablet 0   amLODipine  (NORVASC ) 5 MG tablet TAKE 1 TABLET BY MOUTH DAILY. PT. MUST MAKE AN APPOINTMENT IN ORDER TO RECEIVE FUTURE REFILLS 90 tablet 1   Blood Glucose Monitoring Suppl (FREESTYLE LITE) w/Device KIT 1 each by Does not apply route 3 (three) times daily. DX: E11.65 1 kit 1   gabapentin  (NEURONTIN ) 300 MG capsule Take 1 capsule (300 mg total) by mouth 3 (three) times daily. 90 capsule 2   gabapentin  (NEURONTIN ) 800 MG tablet Take 1 tablet (800 mg total) by mouth at bedtime. 90 tablet 1   hydrOXYzine   (ATARAX ) 25 MG tablet Take 1 tablet (25 mg total) by mouth 3 (three) times daily as needed. 30 tablet 0   Insulin  Pen Needle (NOVOFINE) 30G X 8 MM MISC USE AS DIRECTED WITH SAXENDA  100 each 2   Lancets (FREESTYLE) lancets Use to check blood sugars 3 times a week DX: E11.65 150 each 3   metFORMIN  (GLUCOPHAGE ) 500 MG tablet Take 1 tablet (500 mg total) by mouth 2 (two) times daily with a meal. 60 tablet 2   Semaglutide -Weight Management (WEGOVY ) 0.5 MG/0.5ML SOAJ Inject 0.5 mg into the skin once a week. 2 mL 1   Semaglutide -Weight Management (WEGOVY ) 1 MG/0.5ML SOAJ Inject 1 mg into the skin once a week. 2 mL 1   telmisartan -hydrochlorothiazide  (MICARDIS  HCT) 80-25 MG tablet TAKE 1 TABLET BY MOUTH EVERY DAY 90 tablet 3   thiamine (VITAMIN B-1) 100 MG tablet Take 100 mg by mouth daily.     vitamin B-12 (CYANOCOBALAMIN) 500 MCG tablet Take 500 mcg by mouth daily.     No current facility-administered medications for this visit.   No results found.  Review of Systems:   A ROS was performed including pertinent positives and negatives as documented in the HPI.  Physical Exam :   Constitutional: NAD and appears stated age Neurological: Alert and oriented Psych: Appropriate affect and cooperative There were no vitals taken for this visit.   Comprehensive Musculoskeletal Exam:    Right shoulder with positive scapular dyskinesis and forward elevation and tight pectoral muscles.  Remainder of neurovascular exam is intact.  Bilateral tenderness about the greater trochanteric.  Positive FADIR bilaterally although this is weakly positive.  Remainder of distal neurosensory exam is intact   Imaging:   Xray (pelvis, 3 views bilateral hips, 3 views right shoulder): Normal    I personally reviewed and interpreted the radiographs.   Assessment and Plan:   58 y.o. female with evidence of right shoulder scapular dyskinesis as well as bilateral gluteus tendinopathy versus mild hip osteoarthritis.   Today's visit I discussed that I would like to obtain an MRI of both hips so that we can rule out or rule in any type of gluteal tearing versus labral tearing as well as get her engaged with physical therapy for core strengthening as well as hip strengthening in addition to a home program for scapular dyskinesis.  I will plan to see her back in 6 weeks for reassessment  -Turn to clinic 6 weeks for reassessment   I personally saw and evaluated the patient, and participated in the management and treatment plan.  Wilhelmenia Harada, MD Attending Physician, Orthopedic Surgery  This document was dictated using Dragon  voice recognition software. A reasonable attempt at proof reading has been made to minimize errors.

## 2024-05-04 DIAGNOSIS — F3289 Other specified depressive episodes: Secondary | ICD-10-CM | POA: Diagnosis not present

## 2024-05-04 DIAGNOSIS — F1020 Alcohol dependence, uncomplicated: Secondary | ICD-10-CM | POA: Diagnosis not present

## 2024-05-06 ENCOUNTER — Ambulatory Visit
Admission: RE | Admit: 2024-05-06 | Discharge: 2024-05-06 | Disposition: A | Discharge status: Home / Self Care | Source: Ambulatory Visit | Attending: Orthopaedic Surgery

## 2024-05-06 ENCOUNTER — Ambulatory Visit
Admission: RE | Admit: 2024-05-06 | Discharge: 2024-05-06 | Disposition: A | Discharge status: Home / Self Care | Source: Ambulatory Visit | Attending: Orthopaedic Surgery | Admitting: Orthopaedic Surgery

## 2024-05-06 DIAGNOSIS — M25552 Pain in left hip: Secondary | ICD-10-CM | POA: Diagnosis not present

## 2024-05-06 DIAGNOSIS — G8929 Other chronic pain: Secondary | ICD-10-CM | POA: Diagnosis not present

## 2024-05-06 DIAGNOSIS — M25551 Pain in right hip: Secondary | ICD-10-CM

## 2024-05-10 DIAGNOSIS — F3289 Other specified depressive episodes: Secondary | ICD-10-CM | POA: Diagnosis not present

## 2024-05-10 DIAGNOSIS — F1020 Alcohol dependence, uncomplicated: Secondary | ICD-10-CM | POA: Diagnosis not present

## 2024-05-11 DIAGNOSIS — Z7689 Persons encountering health services in other specified circumstances: Secondary | ICD-10-CM | POA: Diagnosis not present

## 2024-05-11 LAB — COMPREHENSIVE METABOLIC PANEL WITH GFR
Albumin: 4.5 (ref 3.5–5.0)
Calcium: 9.8 (ref 8.7–10.7)
Globulin: 2.8
eGFR: 76

## 2024-05-11 LAB — LIPID PANEL
Cholesterol: 192 (ref 0–200)
HDL: 92 — AB (ref 35–70)
LDL Cholesterol: 83
Triglycerides: 98 (ref 40–160)

## 2024-05-11 LAB — BASIC METABOLIC PANEL WITH GFR
BUN: 11 (ref 4–21)
CO2: 23 — AB (ref 13–22)
Chloride: 100 (ref 99–108)
Creatinine: 0.9 (ref 0.5–1.1)
Glucose: 87
Potassium: 4.5 meq/L (ref 3.5–5.1)
Sodium: 139 (ref 137–147)

## 2024-05-11 LAB — HEMOGLOBIN A1C: Hemoglobin A1C: 5.8

## 2024-05-11 LAB — CBC AND DIFFERENTIAL
HCT: 39 (ref 36–46)
Hemoglobin: 12.8 (ref 12.0–16.0)
WBC: 2.9

## 2024-05-11 LAB — HEPATIC FUNCTION PANEL
ALT: 12 U/L (ref 7–35)
AST: 20 (ref 13–35)
Alkaline Phosphatase: 113 (ref 25–125)

## 2024-05-11 LAB — TSH: TSH: 2.01 (ref 0.41–5.90)

## 2024-05-11 LAB — CBC: RBC: 4.38 (ref 3.87–5.11)

## 2024-05-11 LAB — VITAMIN B12: Vitamin B-12: >2000

## 2024-05-11 NOTE — Therapy (Signed)
 OUTPATIENT PHYSICAL THERAPY UPPER EXTREMITY EVALUATION   Patient Name: Natasha [name] MRN: [id number] DOB:01/18/1966, 58 y.o., female Today's Date: 05/11/2024  END OF SESSION:   Past Medical History:  Diagnosis Date   Colon polyps    Diabetes mellitus without complication (HCC)    pre diabetes   GERD (gastroesophageal reflux disease)    Hypertension    Obesity    Past Surgical History:  Procedure Laterality Date   AUGMENTATION MAMMAPLASTY     GASTRIC BYPASS     Patient Active Problem List   Diagnosis Date Noted   Class 3 severe obesity due to excess calories with serious comorbidity and body mass index (BMI) of 45.0 to 49.9 in adult 03/05/2024   Anxiety 03/05/2024   Other insomnia 03/05/2024   COVID-19 vaccination declined 11/03/2023   Abnormal glucose 12/06/2022   Essential hypertension 12/06/2022   Gastroesophageal reflux disease without esophagitis 12/06/2022   Class 3 drug-induced obesity with serious comorbidity and body mass index (BMI) of 40.0 to 44.9 in adult [facility]) 12/06/2022    PCP: Susanna Epley, FNP   REFERRING PROVIDER: Wilhelmenia Harada, MD   REFERRING DIAG: [phone] (ICD-10-CM) - Chronic right shoulder pain  Scapular dyskinesis program  Anterior chain mobilization  THERAPY DIAG:  No diagnosis found.  Rationale for Evaluation and Treatment: Rehabilitation  ONSET DATE: ***  SUBJECTIVE:                                                                                                                                                                                      SUBJECTIVE STATEMENT: *** Hand dominance: {MISC; OT HAND [phone]}  PERTINENT HISTORY: *** B hip pain  PAIN:  Are you having pain? Yes: NPRS scale: *** Pain location: *** Pain description: *** Aggravating factors: *** Relieving factors: ***  PRECAUTIONS: None  RED FLAGS: {PT Red Flags:29287}   WEIGHT BEARING RESTRICTIONS: No  FALLS:  Has patient fallen in  last 6 months? {fallsyesno:27318}  LIVING ENVIRONMENT: Lives with: {[facility] lives with:25569::"lives with their family"} Lives in: {Lives in:25570} Stairs: {opstairs:27293} Has following equipment at home: {Assistive devices:23999}  OCCUPATION: ***  PLOF: {PLOF:24004}  PATIENT GOALS: ***  NEXT MD VISIT: ***  OBJECTIVE:  Note: Objective measures were completed at Evaluation unless otherwise noted.  DIAGNOSTIC FINDINGS:  Mild A/C OA  PATIENT SURVEYS :  UEFS  Extreme difficulty/unable (0), Quite a bit of difficulty (1), Moderate difficulty (2), Little difficulty (3), No difficulty (4) Survey date:    Any of your usual work, household or school activities   2. Your usual hobbies, recreational/sport activities    3. Lifting a bag of groceries to waist level    4.  Lifting a bag of groceries above your head   5. Grooming your hair   6. Pushing up on your hands (I.e. from bathtub or chair)   7. Preparing food (I.e. peeling/cutting)   8. Driving    9. Vacuuming, sweeping, or raking   10. Dressing    11. Doing up buttons   12. Using tools/appliances   13. Opening doors   14. Cleaning    15. Tying or lacing shoes   16. Sleeping    17. Laundering clothes (I.e. washing, ironing, folding)   18. Opening a jar   19. Throwing a ball   20. Carrying a small suitcase with your affected limb.    Score total:  ***     COGNITION: Overall cognitive status: Within functional limits for tasks assessed     SENSATION: {sensation:27233}  POSTURE: ***  UPPER EXTREMITY ROM:   {AROM/PROM:27142} ROM Right eval Left eval  Shoulder flexion    Shoulder extension    Shoulder abduction    Shoulder adduction    Shoulder internal rotation    Shoulder external rotation    Elbow flexion    Elbow extension    Wrist flexion    Wrist extension    Wrist ulnar deviation    Wrist radial deviation    Wrist pronation    Wrist supination    (Blank rows = not tested)  UPPER EXTREMITY  MMT:  MMT Right eval Left eval  Shoulder flexion    Shoulder extension    Shoulder abduction    Shoulder adduction    Shoulder internal rotation    Shoulder external rotation    Middle trapezius    Lower trapezius    Elbow flexion    Elbow extension    Wrist flexion    Wrist extension    Wrist ulnar deviation    Wrist radial deviation    Wrist pronation    Wrist supination    Grip strength (lbs)    (Blank rows = not tested)  SHOULDER SPECIAL TESTS: Impingement tests: {shoulder impingement test:25231:a} SLAP lesions: {SLAP lesions:25232} Instability tests: {shoulder instability test:25233} Rotator cuff assessment: {rotator cuff assessment:25234} Biceps assessment: {biceps assessment:25235}  JOINT MOBILITY TESTING:  ***  PALPATION:  ***                                                                                                                             TREATMENT DATE: *** 05/12/24  See pt ed and HEP   PATIENT EDUCATION: Education details: PT eval findings, anticipated POC, initial HEP, and postural awareness   Person educated: Patient Education method: Explanation, Demonstration, and Handouts Education comprehension: verbalized understanding and returned demonstration  HOME EXERCISE PROGRAM: ***  ASSESSMENT:  CLINICAL IMPRESSION: Patient is a 58 y.o. female who was seen today for physical therapy evaluation and treatment for chronic R shoulder pain.    OBJECTIVE IMPAIRMENTS: {opptimpairments:25111}.   ACTIVITY LIMITATIONS: {activitylimitations:27494}  PARTICIPATION LIMITATIONS: {participationrestrictions:25113}  PERSONAL FACTORS: {  Personal factors:25162} are also affecting patient's functional outcome.   REHAB POTENTIAL: {rehabpotential:25112}  CLINICAL DECISION MAKING: {clinical decision making:25114}  EVALUATION COMPLEXITY: {Evaluation complexity:25115}  GOALS: Goals reviewed with patient? {yes/no:20286}  SHORT TERM GOALS: Target date:  {follow up:25551}   Patient will be independent with initial HEP.  Baseline: *** Goal status: INITIAL  2.  ***  Baseline: *** Goal status: INITIAL  3.  *** Baseline: *** Goal status: INITIAL   LONG TERM GOALS: Target date: {follow up:25551}   Patient will be independent with advanced/ongoing HEP to improve outcomes and carryover.  Baseline: *** Goal status: INITIAL  2.  Patient will report 75% improvement in *** shoulder pain to improve QOL.  Baseline: *** Goal status: INITIAL  3.  Patient to improve *** shoulder AROM to {Functional status:27472} without pain provocation to allow for increased ease of ADLs.  Baseline: *** Goal status: INITIAL  4.  Patient will demonstrate improved UE strength to 5/5.  Baseline: *** Goal status: INITIAL  5. Decreased Quick Dash by >= 10 points demonstrating improved functional ability.  Baseline: *** Goal status: INITIAL  6.  Patient will ***   Baseline: *** Goal status: INITIAL  7. *** Baseline: *** Goal status: INITIAL  PLAN: PT FREQUENCY: {rehab frequency:25116}  PT DURATION: {rehab duration:25117}  PLANNED INTERVENTIONS: {rehab planned interventions:25118::"97110-Therapeutic exercises","97530- Therapeutic [phone]- Neuromuscular re-education","97535- Self RSWN","46270- Manual therapy"}  PLAN FOR NEXT SESSION: ***   Bayyinah Dukeman, PT 05/11/2024, 8:55 PM

## 2024-05-12 ENCOUNTER — Other Ambulatory Visit: Payer: Self-pay

## 2024-05-12 ENCOUNTER — Ambulatory Visit: Attending: Orthopaedic Surgery | Admitting: Physical Therapy

## 2024-05-12 ENCOUNTER — Encounter: Payer: Self-pay | Admitting: Physical Therapy

## 2024-05-12 DIAGNOSIS — M6281 Muscle weakness (generalized): Secondary | ICD-10-CM | POA: Diagnosis not present

## 2024-05-12 DIAGNOSIS — M25552 Pain in left hip: Secondary | ICD-10-CM | POA: Diagnosis not present

## 2024-05-12 DIAGNOSIS — M542 Cervicalgia: Secondary | ICD-10-CM | POA: Diagnosis not present

## 2024-05-12 DIAGNOSIS — M25511 Pain in right shoulder: Secondary | ICD-10-CM | POA: Insufficient documentation

## 2024-05-12 DIAGNOSIS — M25551 Pain in right hip: Secondary | ICD-10-CM | POA: Insufficient documentation

## 2024-05-12 DIAGNOSIS — R252 Cramp and spasm: Secondary | ICD-10-CM

## 2024-05-12 DIAGNOSIS — G8929 Other chronic pain: Secondary | ICD-10-CM | POA: Insufficient documentation

## 2024-05-19 ENCOUNTER — Encounter: Payer: Self-pay | Admitting: Nurse Practitioner

## 2024-05-24 ENCOUNTER — Other Ambulatory Visit (HOSPITAL_COMMUNITY): Payer: Self-pay

## 2024-05-24 DIAGNOSIS — N951 Menopausal and female climacteric states: Secondary | ICD-10-CM | POA: Diagnosis not present

## 2024-05-24 DIAGNOSIS — F1020 Alcohol dependence, uncomplicated: Secondary | ICD-10-CM | POA: Diagnosis not present

## 2024-05-24 DIAGNOSIS — Z1231 Encounter for screening mammogram for malignant neoplasm of breast: Secondary | ICD-10-CM | POA: Diagnosis not present

## 2024-05-24 DIAGNOSIS — G4709 Other insomnia: Secondary | ICD-10-CM | POA: Diagnosis not present

## 2024-05-24 DIAGNOSIS — D251 Intramural leiomyoma of uterus: Secondary | ICD-10-CM | POA: Diagnosis not present

## 2024-05-24 DIAGNOSIS — Z01411 Encounter for gynecological examination (general) (routine) with abnormal findings: Secondary | ICD-10-CM | POA: Diagnosis not present

## 2024-05-24 DIAGNOSIS — F3289 Other specified depressive episodes: Secondary | ICD-10-CM | POA: Diagnosis not present

## 2024-05-24 MED ORDER — GABAPENTIN 800 MG PO TABS
800.0000 mg | ORAL_TABLET | Freq: Every evening | ORAL | 3 refills | Status: AC
  Filled 2024-05-24: qty 90, 90d supply, fill #0
  Filled 2024-07-24 – 2024-10-21 (×4): qty 90, 90d supply, fill #1
  Filled 2024-12-24 – 2025-01-01 (×3): qty 90, 90d supply, fill #2

## 2024-05-31 ENCOUNTER — Encounter: Payer: Self-pay | Admitting: Physical Therapy

## 2024-05-31 ENCOUNTER — Ambulatory Visit: Admitting: Physical Therapy

## 2024-05-31 DIAGNOSIS — M6281 Muscle weakness (generalized): Secondary | ICD-10-CM | POA: Diagnosis not present

## 2024-05-31 DIAGNOSIS — M25552 Pain in left hip: Secondary | ICD-10-CM | POA: Diagnosis not present

## 2024-05-31 DIAGNOSIS — M25511 Pain in right shoulder: Secondary | ICD-10-CM | POA: Diagnosis not present

## 2024-05-31 DIAGNOSIS — G8929 Other chronic pain: Secondary | ICD-10-CM

## 2024-05-31 DIAGNOSIS — M542 Cervicalgia: Secondary | ICD-10-CM

## 2024-05-31 DIAGNOSIS — R252 Cramp and spasm: Secondary | ICD-10-CM

## 2024-05-31 DIAGNOSIS — M25551 Pain in right hip: Secondary | ICD-10-CM | POA: Diagnosis not present

## 2024-05-31 NOTE — Therapy (Signed)
 OUTPATIENT PHYSICAL THERAPY UPPER EXTREMITY TREATMENT   Patient Name: Natasha [name] MRN: [id number] DOB:[birth year], 58 y.o., female Today's Date: 05/31/2024  END OF SESSION:  PT End of Session - 05/31/24 1449     Visit Number 2    Date for PT Re-Evaluation 07/07/24    Authorization Type [facility] [employer]    PT Start Time 1445    PT Stop Time 1525    PT Time Calculation (min) 40 min    Activity Tolerance Patient tolerated treatment well    Behavior During Therapy WFL for tasks assessed/performed           Past Medical History:  Diagnosis Date   Colon polyps    Diabetes mellitus without complication (HCC)    pre diabetes   GERD (gastroesophageal reflux disease)    Hypertension    Obesity    Past Surgical History:  Procedure Laterality Date   AUGMENTATION MAMMAPLASTY     GASTRIC BYPASS     Patient Active Problem List   Diagnosis Date Noted   Class 3 severe obesity due to excess calories with serious comorbidity and body mass index (BMI) of 45.0 to 49.9 in adult 03/05/2024   Anxiety 03/05/2024   Other insomnia 03/05/2024   COVID-19 vaccination declined 11/03/2023   Abnormal glucose 12/06/2022   Essential hypertension 12/06/2022   Gastroesophageal reflux disease without esophagitis 12/06/2022   Class 3 drug-induced obesity with serious comorbidity and body mass index (BMI) of 40.0 to 44.9 in adult [facility]) 12/06/2022    PCP: Georgina Speaks, FNP   REFERRING PROVIDER: Genelle Standing, MD   REFERRING DIAG: [phone] (ICD-10-CM) - Chronic right shoulder pain  Scapular dyskinesis program  Anterior chain mobilization  THERAPY DIAG:  Chronic right shoulder pain  Muscle weakness (generalized)  Cramp and spasm  Cervicalgia  Rationale for Evaluation and Treatment: Rehabilitation  ONSET DATE: many months ago  SUBJECTIVE:                                                                                                                                                                                       SUBJECTIVE STATEMENT: I would like to have my hips assessed but since I wore a dress today I want to do that next time.  I am using the blue tband and the exercises are going well.  I catch myself in bad posture when the pain comes on and I can immediately make it feel better when I fix my posture  Hand dominance: Right  PERTINENT HISTORY:  B hip pain, HTN  PAIN:  Are you having pain? Yes: NPRS scale: 7/10 Pain location: R neck and down medial scapula Pain  description: stabbing, aching pain Aggravating factors: unsure/positional Relieving factors: Relief with hand overhead and with overpressure to medial scapula   PRECAUTIONS: None  RED FLAGS: None   WEIGHT BEARING RESTRICTIONS: No  FALLS:  Has patient fallen in last 6 months? No  LIVING ENVIRONMENT: Lives with: lives with their family Lives in: House/apartment Stairs: Yes: Internal: 15 steps; on right going up Has following equipment at home: None  OCCUPATION: Nurse  PLOF: Independent  PATIENT GOALS: Less pain  NEXT MD VISIT: 6 weeks  OBJECTIVE:  Note: Objective measures were completed at Evaluation unless otherwise noted.  DIAGNOSTIC FINDINGS:  Mild A/C OA  PATIENT SURVEYS :  UEFS  Extreme difficulty/unable (0), Quite a bit of difficulty (1), Moderate difficulty (2), Little difficulty (3), No difficulty (4) Survey date:    Any of your usual work, household or school activities 3  2. Your usual hobbies, recreational/sport activities 0   3. Lifting a bag of groceries to waist level 4   4. Lifting a bag of groceries above your head 3  5. Grooming your hair 3  6. Pushing up on your hands (I.e. from bathtub or chair) 2  7. Preparing food (I.e. peeling/cutting) 4  8. Driving  3  9. Vacuuming, sweeping, or raking 3  10. Dressing  3  11. Doing up buttons 4  12. Using tools/appliances 4  13. Opening doors 4  14. Cleaning  4  15. Tying or lacing shoes 4  16. Sleeping  1  17.  Laundering clothes (I.e. washing, ironing, folding) 4  18. Opening a jar 4  19. Throwing a ball 4  20. Carrying a small suitcase with your affected limb.  2  Score total:  63     COGNITION: Overall cognitive status: Within functional limits for tasks assessed     SENSATION: Not tested  POSTURE: Mild rounded shoulders,   CERVICAL ROM: * into R UT  Active ROM A/PROM (deg) eval  Flexion Full *  Extension 25*  Right lateral flexion 19 *  Left lateral flexion 14 relief  Right rotation 52  Left rotation 45   (Blank rows = not tested)   UPPER EXTREMITY ROM: Full B shoulder ROM, Relief with functional IR behind back   UPPER EXTREMITY MMT:  MMT Right eval Left eval  Shoulder flexion 4+   Shoulder extension 4+   Shoulder abduction 4 mild discomfort   Shoulder adduction    Shoulder internal rotation 5   Shoulder external rotation 5   Middle trapezius 4 5  Lower trapezius 4- 5  (Blank rows = not tested)  SHOULDER SPECIAL TESTS: Impingement tests: Neer impingement test: negative and Hawkins/Kennedy impingement test: negative Rotator cuff assessment: Empty can test: positive  and Full can test: negative  scaption weak  CERVICAL SPECIAL TESTS: Neg compression/distraction, negative spurlings B  PALPATION:  Increased tension and TTP of R UT and levator scap. Pain with UPA mobs to lower cervical and upper thoracic spine  TREATMENT DATE:  05/31/24: UBE L3 2x2 PT present to discuss plan for session Seated upper trap and levator stretch 2x30 - Rt side (added to HEP) Seated neck retraction into towel 3x10 - added to HEP Pec stretch in doorway x30 Blue tband x12 each: standing bil row, bil shoulder ext, bil horiz abd, bil shoulder ER Review of sleeping positions in SL - adding a towel roll under neck gave immediate relief, discussed using pillow to hug  to keep scapula in neutral with shoulder in loose pack position Discussed how anything used frequently at her desk (mouse, keyboard) need to be within forearms distance when arms are resting at sides with relaxed neck, discussed height of monitor to maintain neutral neck posture  05/12/24  See pt ed and HEP   PATIENT EDUCATION: Education details: PT eval findings, anticipated POC, initial HEP, postural awareness, posture and body mechanics for typical daily postioning, mobility and household tasks, and desk setup discussed.   Person educated: Patient Education method: Explanation, Demonstration, and Handouts Education comprehension: verbalized understanding and returned demonstration  HOME EXERCISE PROGRAM: Access Code: [email or web address] URL: [email or web address] Date: 05/31/2024 Prepared by: Orvil Fallynn Gravett  Exercises - Standing Shoulder Horizontal Abduction with Resistance  - 1 x daily - 3 x weekly - 1-3 sets - 10 reps - Standing Shoulder Row with Anchored Resistance  - 1 x daily - 3 x weekly - 1-3 sets - 10 reps - Shoulder extension with resistance - Neutral  - 1 x daily - 3 x weekly - 1-3 sets - 10 reps - Standing Shoulder Single Arm PNF D2 Flexion with Resistance  - 1 x daily - 3 x weekly - 1-3 sets - 10 reps - Shoulder External Rotation and Scapular Retraction with Resistance  - 1 x daily - 7 x weekly - 1-3 sets - 10 reps - Doorway Pec Stretch at 60 Elevation  - 2 x daily - 7 x weekly - 1 sets - 2 reps - 30 sec hold - Seated Upper Trapezius Stretch  - 3 x daily - 7 x weekly - 1 sets - 1 reps - 30 hold - Seated Levator Scapulae Stretch  - 1 x daily - 7 x weekly - 1 sets - 1 reps - 30 hold - Cervical Retraction with Resistance  - 1 x daily - 7 x weekly - 1 sets - 3 reps - 10 hold  Patient Education - Office Posture  ASSESSMENT:  CLINICAL IMPRESSION: First time follow up for Natasha [name].  She reports good response to self-checks on posture and with HEP.  Neck stretches and  cervical stabilization exercises added today which gave Pt signif relief.  We also explored using a towel roll under neck in SL for sleep as well as a hugging pillow to keep top shoulder in open pack position which Pt felt was going to be very helpful.  She is using proper technique with blue tband for HEP per my review with her today. She will continue to benefit from skilled PT along POC.   OBJECTIVE IMPAIRMENTS: decreased activity tolerance, decreased ROM, decreased strength, increased muscle spasms, impaired flexibility, impaired UE functional use, postural dysfunction, obesity, and pain.   ACTIVITY LIMITATIONS: carrying, lifting, standing, and sleeping  PARTICIPATION LIMITATIONS: meal prep, cleaning, laundry, community activity, occupation, and yard work  PERSONAL FACTORS: Time since onset of injury/illness/exacerbation and 1-2 comorbidities: B hip pain, HTN are also affecting patient's functional outcome.   REHAB POTENTIAL: Excellent  CLINICAL DECISION MAKING: Evolving/moderate complexity  EVALUATION COMPLEXITY: Moderate  GOALS: Goals reviewed with patient? Yes  SHORT TERM GOALS: Target date: 06/02/2024   Patient will be independent with initial HEP.  Baseline:  Goal status: MET 6/25  2.  Improved sleep by 50% secondary to decreased shoulder pain.  Baseline:  Goal status: INITIAL  3.  Decreased shoulder pain by 30% with ADLs Baseline:  Goal status: INITIAL   LONG TERM GOALS: Target date: 07/07/2024   Patient will be independent with advanced/ongoing HEP to improve outcomes and carryover.  Baseline:  Goal status: INITIAL  2.  Patient able to carry and lift without increased shoulder pain. Baseline:  Goal status: INITIAL  3.  Patient to demonstrate functional neck ROM without pain provocation.  Baseline:  Goal status: INITIAL  4.  Patient will demonstrate improved R shoulder and scapular strength to 5/5 in able to lift and carry without difficulty.  Baseline:  Goal  status: INITIAL  5. Increased UEFS score to 72 or better demonstrating improved functional ability.  Baseline: 63 Goal status: INITIAL   PLAN: PT FREQUENCY: 1-2x/week  PT DURATION: 8 weeks  PLANNED INTERVENTIONS: 97164- PT Re-evaluation, 97110-Therapeutic exercises, 97530- Therapeutic activity, 97112- Neuromuscular re-education, 97535- Self Care, 02859- Manual therapy, [phone]- Aquatic Therapy, [phone]- Electrical stimulation (unattended), [phone] (1-2 muscles), 20561 (3+ muscles)- Dry Needling, Patient/Family education, Joint mobilization, Spinal mobilization, Cryotherapy, and Moist heat  PLAN FOR NEXT SESSION: Review and progress HEP, how did Pt sleep with adding towel roll under neck in SL and using hugging pillow, Assess B hips (Pt was wearing a dress last visit so wanted to do next time), Manual to R UQ, neck ROM, shoulder and mid/low trap strengthening, spinal mobs prn   Matrice Herro, PT 05/31/24 4:36 PM

## 2024-06-01 DIAGNOSIS — F3289 Other specified depressive episodes: Secondary | ICD-10-CM | POA: Diagnosis not present

## 2024-06-01 DIAGNOSIS — F1020 Alcohol dependence, uncomplicated: Secondary | ICD-10-CM | POA: Diagnosis not present

## 2024-06-01 NOTE — Therapy (Incomplete)
 OUTPATIENT PHYSICAL THERAPY UPPER EXTREMITY TREATMENT   Patient Name: Natasha [name] MRN: [id number] DOB:06-10-1966, 58 y.o., female Today's Date: 06/01/2024  END OF SESSION:     Past Medical History:  Diagnosis Date   Colon polyps    Diabetes mellitus without complication (HCC)    pre diabetes   GERD (gastroesophageal reflux disease)    Hypertension    Obesity    Past Surgical History:  Procedure Laterality Date   AUGMENTATION MAMMAPLASTY     GASTRIC BYPASS     Patient Active Problem List   Diagnosis Date Noted   Class 3 severe obesity due to excess calories with serious comorbidity and body mass index (BMI) of 45.0 to 49.9 in adult 03/05/2024   Anxiety 03/05/2024   Other insomnia 03/05/2024   COVID-19 vaccination declined 11/03/2023   Abnormal glucose 12/06/2022   Essential hypertension 12/06/2022   Gastroesophageal reflux disease without esophagitis 12/06/2022   Class 3 drug-induced obesity with serious comorbidity and body mass index (BMI) of 40.0 to 44.9 in adult [facility]) 12/06/2022    PCP: Georgina Speaks, FNP   REFERRING PROVIDER: Genelle Standing, MD   REFERRING DIAG: [phone] (ICD-10-CM) - Chronic right shoulder pain  Scapular dyskinesis program  Anterior chain mobilization  THERAPY DIAG:  No diagnosis found.  Rationale for Evaluation and Treatment: Rehabilitation  ONSET DATE: many months ago  SUBJECTIVE:                                                                                                                                                                                      SUBJECTIVE STATEMENT: I would like to have my hips assessed but since I wore a dress today I want to do that next time.  I am using the blue tband and the exercises are going well.  I catch myself in bad posture when the pain comes on and I can immediately make it feel better when I fix my posture  Hand dominance: Right  PERTINENT HISTORY:  B hip pain, HTN  PAIN:   Are you having pain? Yes: NPRS scale: 7/10 Pain location: R neck and down medial scapula Pain description: stabbing, aching pain Aggravating factors: unsure/positional Relieving factors: Relief with hand overhead and with overpressure to medial scapula   PRECAUTIONS: None  RED FLAGS: None   WEIGHT BEARING RESTRICTIONS: No  FALLS:  Has patient fallen in last 6 months? No  LIVING ENVIRONMENT: Lives with: lives with their family Lives in: House/apartment Stairs: Yes: Internal: 15 steps; on right going up Has following equipment at home: None  OCCUPATION: Nurse  PLOF: Independent  PATIENT GOALS: Less pain  NEXT MD VISIT: 6 weeks  OBJECTIVE:  Note: Objective measures were completed at Evaluation unless otherwise noted.  DIAGNOSTIC FINDINGS:  Mild A/C OA  PATIENT SURVEYS :  UEFS  Extreme difficulty/unable (0), Quite a bit of difficulty (1), Moderate difficulty (2), Little difficulty (3), No difficulty (4) Survey date:    Any of your usual work, household or school activities 3  2. Your usual hobbies, recreational/sport activities 0   3. Lifting a bag of groceries to waist level 4   4. Lifting a bag of groceries above your head 3  5. Grooming your hair 3  6. Pushing up on your hands (I.e. from bathtub or chair) 2  7. Preparing food (I.e. peeling/cutting) 4  8. Driving  3  9. Vacuuming, sweeping, or raking 3  10. Dressing  3  11. Doing up buttons 4  12. Using tools/appliances 4  13. Opening doors 4  14. Cleaning  4  15. Tying or lacing shoes 4  16. Sleeping  1  17. Laundering clothes (I.e. washing, ironing, folding) 4  18. Opening a jar 4  19. Throwing a ball 4  20. Carrying a small suitcase with your affected limb.  2  Score total:  63     COGNITION: Overall cognitive status: Within functional limits for tasks assessed     SENSATION: Not tested  POSTURE: Mild rounded shoulders,   CERVICAL ROM: * into R UT  Active ROM A/PROM (deg) eval  Flexion  Full *  Extension 25*  Right lateral flexion 19 *  Left lateral flexion 14 relief  Right rotation 52  Left rotation 45   (Blank rows = not tested)   UPPER EXTREMITY ROM: Full B shoulder ROM, Relief with functional IR behind back   UPPER EXTREMITY MMT:  MMT Right eval Left eval  Shoulder flexion 4+   Shoulder extension 4+   Shoulder abduction 4 mild discomfort   Shoulder adduction    Shoulder internal rotation 5   Shoulder external rotation 5   Middle trapezius 4 5  Lower trapezius 4- 5  (Blank rows = not tested)  SHOULDER SPECIAL TESTS: Impingement tests: Neer impingement test: negative and Hawkins/Kennedy impingement test: negative Rotator cuff assessment: Empty can test: positive  and Full can test: negative  scaption weak  CERVICAL SPECIAL TESTS: Neg compression/distraction, negative spurlings B  PALPATION:  Increased tension and TTP of R UT and levator scap. Pain with UPA mobs to lower cervical and upper thoracic spine                                                                                                                             TREATMENT DATE:  06/02/24: UBE L3 2x2 PT present to discuss plan for session Assess hip pain? Seated upper trap and levator stretch 2x30 - Rt side (added to HEP) Seated neck retraction into towel 3x10 - added to HEP Pec stretch in doorway x30 Blue tband x12 each: standing bil row, bil shoulder ext, bil  horiz abd, bil shoulder ER 3 way reach  Blue plyoball clocks   05/31/24: UBE L3 2x2 PT present to discuss plan for session Seated upper trap and levator stretch 2x30 - Rt side (added to HEP) Seated neck retraction into towel 3x10 - added to HEP Pec stretch in doorway x30 Blue tband x12 each: standing bil row, bil shoulder ext, bil horiz abd, bil shoulder ER Review of sleeping positions in SL - adding a towel roll under neck gave immediate relief, discussed using pillow to hug to keep scapula in neutral with shoulder in  loose pack position Discussed how anything used frequently at her desk (mouse, keyboard) need to be within forearms distance when arms are resting at sides with relaxed neck, discussed height of monitor to maintain neutral neck posture  05/12/24  See pt ed and HEP   PATIENT EDUCATION: Education details: PT eval findings, anticipated POC, initial HEP, postural awareness, posture and body mechanics for typical daily postioning, mobility and household tasks, and desk setup discussed.   Person educated: Patient Education method: Explanation, Demonstration, and Handouts Education comprehension: verbalized understanding and returned demonstration  HOME EXERCISE PROGRAM: Access Code: [email or web address] URL: [email or web address] Date: 05/31/2024 Prepared by: Orvil Beuhring  Exercises - Standing Shoulder Horizontal Abduction with Resistance  - 1 x daily - 3 x weekly - 1-3 sets - 10 reps - Standing Shoulder Row with Anchored Resistance  - 1 x daily - 3 x weekly - 1-3 sets - 10 reps - Shoulder extension with resistance - Neutral  - 1 x daily - 3 x weekly - 1-3 sets - 10 reps - Standing Shoulder Single Arm PNF D2 Flexion with Resistance  - 1 x daily - 3 x weekly - 1-3 sets - 10 reps - Shoulder External Rotation and Scapular Retraction with Resistance  - 1 x daily - 7 x weekly - 1-3 sets - 10 reps - Doorway Pec Stretch at 60 Elevation  - 2 x daily - 7 x weekly - 1 sets - 2 reps - 30 sec hold - Seated Upper Trapezius Stretch  - 3 x daily - 7 x weekly - 1 sets - 1 reps - 30 hold - Seated Levator Scapulae Stretch  - 1 x daily - 7 x weekly - 1 sets - 1 reps - 30 hold - Cervical Retraction with Resistance  - 1 x daily - 7 x weekly - 1 sets - 3 reps - 10 hold  Patient Education - Office Posture  ASSESSMENT:  CLINICAL IMPRESSION: ***   OBJECTIVE IMPAIRMENTS: decreased activity tolerance, decreased ROM, decreased strength, increased muscle spasms, impaired flexibility, impaired UE functional  use, postural dysfunction, obesity, and pain.   ACTIVITY LIMITATIONS: carrying, lifting, standing, and sleeping  PARTICIPATION LIMITATIONS: meal prep, cleaning, laundry, community activity, occupation, and yard work  PERSONAL FACTORS: Time since onset of injury/illness/exacerbation and 1-2 comorbidities: B hip pain, HTN are also affecting patient's functional outcome.   REHAB POTENTIAL: Excellent  CLINICAL DECISION MAKING: Evolving/moderate complexity  EVALUATION COMPLEXITY: Moderate  GOALS: Goals reviewed with patient? Yes  SHORT TERM GOALS: Target date: 06/02/2024   Patient will be independent with initial HEP.  Baseline:  Goal status: MET 6/25  2.  Improved sleep by 50% secondary to decreased shoulder pain.  Baseline:  Goal status: INITIAL  3.  Decreased shoulder pain by 30% with ADLs Baseline:  Goal status: INITIAL   LONG TERM GOALS: Target date: 07/07/2024   Patient will be independent with advanced/ongoing HEP to improve outcomes and  carryover.  Baseline:  Goal status: INITIAL  2.  Patient able to carry and lift without increased shoulder pain. Baseline:  Goal status: INITIAL  3.  Patient to demonstrate functional neck ROM without pain provocation.  Baseline:  Goal status: INITIAL  4.  Patient will demonstrate improved R shoulder and scapular strength to 5/5 in able to lift and carry without difficulty.  Baseline:  Goal status: INITIAL  5. Increased UEFS score to 72 or better demonstrating improved functional ability.  Baseline: 63 Goal status: INITIAL   PLAN: PT FREQUENCY: 1-2x/week  PT DURATION: 8 weeks  PLANNED INTERVENTIONS: 97164- PT Re-evaluation, 97110-Therapeutic exercises, 97530- Therapeutic activity, 97112- Neuromuscular re-education, 97535- Self Care, 02859- Manual therapy, [phone]- Aquatic Therapy, [phone]- Electrical stimulation (unattended), [phone] (1-2 muscles), 20561 (3+ muscles)- Dry Needling, Patient/Family education, Joint mobilization,  Spinal mobilization, Cryotherapy, and Moist heat  PLAN FOR NEXT SESSION: Review and progress HEP, *** how did Pt sleep with adding towel roll under neck in SL and using hugging pillow, Assess B hips (Pt was wearing a dress last visit so wanted to do next time), Manual to R UQ, neck ROM, shoulder and mid/low trap strengthening, spinal mobs prn   Mliss Cummins, PT  06/01/24 9:33 PM

## 2024-06-02 ENCOUNTER — Ambulatory Visit: Admitting: Physical Therapy

## 2024-06-02 ENCOUNTER — Encounter: Payer: Self-pay | Admitting: Rehabilitation

## 2024-06-02 ENCOUNTER — Ambulatory Visit: Admitting: Rehabilitation

## 2024-06-02 DIAGNOSIS — M25551 Pain in right hip: Secondary | ICD-10-CM | POA: Diagnosis not present

## 2024-06-02 DIAGNOSIS — M6281 Muscle weakness (generalized): Secondary | ICD-10-CM

## 2024-06-02 DIAGNOSIS — M25511 Pain in right shoulder: Secondary | ICD-10-CM | POA: Diagnosis not present

## 2024-06-02 DIAGNOSIS — M542 Cervicalgia: Secondary | ICD-10-CM | POA: Diagnosis not present

## 2024-06-02 DIAGNOSIS — R252 Cramp and spasm: Secondary | ICD-10-CM

## 2024-06-02 DIAGNOSIS — M25552 Pain in left hip: Secondary | ICD-10-CM | POA: Diagnosis not present

## 2024-06-02 DIAGNOSIS — G8929 Other chronic pain: Secondary | ICD-10-CM

## 2024-06-02 NOTE — Therapy (Signed)
 OUTPATIENT PHYSICAL THERAPY UPPER EXTREMITY TREATMENT   Patient Name: Natasha [name] MRN: [id number] DOB:17-Oct-1966, 58 y.o., female Today's Date: 06/02/2024  END OF SESSION:  PT End of Session - 06/02/24 1059     Visit Number 3    Date for PT Re-Evaluation 07/07/24    PT Start Time 1106    PT Stop Time 1146    PT Time Calculation (min) 40 min    Activity Tolerance Patient tolerated treatment well    Behavior During Therapy WFL for tasks assessed/performed            Past Medical History:  Diagnosis Date   Colon polyps    Diabetes mellitus without complication (HCC)    pre diabetes   GERD (gastroesophageal reflux disease)    Hypertension    Obesity    Past Surgical History:  Procedure Laterality Date   AUGMENTATION MAMMAPLASTY     GASTRIC BYPASS     Patient Active Problem List   Diagnosis Date Noted   Class 3 severe obesity due to excess calories with serious comorbidity and body mass index (BMI) of 45.0 to 49.9 in adult 03/05/2024   Anxiety 03/05/2024   Other insomnia 03/05/2024   COVID-19 vaccination declined 11/03/2023   Abnormal glucose 12/06/2022   Essential hypertension 12/06/2022   Gastroesophageal reflux disease without esophagitis 12/06/2022   Class 3 drug-induced obesity with serious comorbidity and body mass index (BMI) of 40.0 to 44.9 in adult [facility]) 12/06/2022    PCP: Georgina Speaks, FNP   REFERRING PROVIDER: Genelle Standing, MD   REFERRING DIAG: [phone] (ICD-10-CM) - Chronic right shoulder pain  Scapular dyskinesis program  Anterior chain mobilization  THERAPY DIAG:  Chronic right shoulder pain  Muscle weakness (generalized)  Cramp and spasm  Cervicalgia  Pain of both hip joints  Rationale for Evaluation and Treatment: Rehabilitation  ONSET DATE: many months ago  SUBJECTIVE:                                                                                                                                                                                       SUBJECTIVE STATEMENT: I think the stretches are helping and I have been working on my posture.    Hand dominance: Right  PERTINENT HISTORY:  B hip pain, HTN  PAIN:  Are you having pain? Yes: NPRS scale: 1/10 up to 8/10 Pain location: R neck and down medial scapula Pain description: stabbing, aching pain Aggravating factors: unsure/positional Relieving factors: Relief with hand overhead and with overpressure to medial scapula   PRECAUTIONS: None  RED FLAGS: None   WEIGHT BEARING RESTRICTIONS: No  FALLS:  Has patient fallen in last  6 months? No  LIVING ENVIRONMENT: Lives with: lives with their family Lives in: House/apartment Stairs: Yes: Internal: 15 steps; on right going up Has following equipment at home: None  OCCUPATION: Nurse  PLOF: Independent  PATIENT GOALS: Less pain  NEXT MD VISIT: 6 weeks  OBJECTIVE:  Note: Objective measures were completed at Evaluation unless otherwise noted.  DIAGNOSTIC FINDINGS:  Mild A/C OA  PATIENT SURVEYS :  UEFS  Extreme difficulty/unable (0), Quite a bit of difficulty (1), Moderate difficulty (2), Little difficulty (3), No difficulty (4) Survey date:    Any of your usual work, household or school activities 3  2. Your usual hobbies, recreational/sport activities 0   3. Lifting a bag of groceries to waist level 4   4. Lifting a bag of groceries above your head 3  5. Grooming your hair 3  6. Pushing up on your hands (I.e. from bathtub or chair) 2  7. Preparing food (I.e. peeling/cutting) 4  8. Driving  3  9. Vacuuming, sweeping, or raking 3  10. Dressing  3  11. Doing up buttons 4  12. Using tools/appliances 4  13. Opening doors 4  14. Cleaning  4  15. Tying or lacing shoes 4  16. Sleeping  1  17. Laundering clothes (I.e. washing, ironing, folding) 4  18. Opening a jar 4  19. Throwing a ball 4  20. Carrying a small suitcase with your affected limb.  2  Score total:  63      COGNITION: Overall cognitive status: Within functional limits for tasks assessed     SENSATION: Not tested  POSTURE: Mild rounded shoulders,   CERVICAL ROM: * into R UT  Active ROM A/PROM (deg) eval  Flexion Full *  Extension 25*  Right lateral flexion 19 *  Left lateral flexion 14 relief  Right rotation 52  Left rotation 45   (Blank rows = not tested)   UPPER EXTREMITY ROM: Full B shoulder ROM, Relief with functional IR behind back   UPPER EXTREMITY MMT:  MMT Right eval Left eval  Shoulder flexion 4+   Shoulder extension 4+   Shoulder abduction 4 mild discomfort   Shoulder adduction    Shoulder internal rotation 5   Shoulder external rotation 5   Middle trapezius 4 5  Lower trapezius 4- 5  (Blank rows = not tested)  SHOULDER SPECIAL TESTS: Impingement tests: Neer impingement test: negative and Hawkins/Kennedy impingement test: negative Rotator cuff assessment: Empty can test: positive  and Full can test: negative  scaption weak  CERVICAL SPECIAL TESTS: Neg compression/distraction, negative spurlings B  PALPATION:  Increased tension and TTP of R UT and levator scap. Pain with UPA mobs to lower cervical and upper thoracic spine  Hip Asessment: 06/02/24 Imaging: IMPRESSION: 1. Mild bilateral femoroacetabular osteoarthritis. 2. Left greater than right lumbosacral assimilation joints with associated moderate degenerative changes. This can be a source of lower back and/or hip pain.  PAIN:  Are you having pain? Yes: NPRS scale: not at rest, up to 7-8/10 when I first stand up Pain location: bil groin area and low back Pain description: achy and stiff Aggravating factors: first standing up to walk, walking too much Relieving factors: TENS, heat, staying active  Subjective:  The hips have been hurting for months now starting when I was walking more.   If I sit down for awhile it gets stiff.  It hurts more in the groin on both sides I also have LBP - they  definitiely go  together.   Objective:  Lumbar:  AROM  Flexion: hands to floor Extension: WNL and feels good Rot B: WNL Sidebend WNL  Hip AROM: WNL Hip PROM: painfree, some pull with cross body position MMT: all 4+/5 without pain Palpation: +1-2 ttp to Rt lateral hip at greater trochanter, +1 to left hip Tightness in quads bil, bil lateral hip Lt>Rt Feels good with lumbar extension standing and prone  Posture: increased lumbar lordosis                                                                                                                              TREATMENT DATE:  06/02/24 Starting discussing neck/shoulder and then switched to hip evaluation per above for bil hip stiffness and discomfort along with low back stiffness See above Then updated HEP to include hip stretches and low back AROM with all performed with cueing and instruction as needed.  Also discussed aquatic therapy which pt is very interested in so we switched her to 1x land 1x aquatic   05/31/24: UBE L3 2x2 PT present to discuss plan for session Seated upper trap and levator stretch 2x30 - Rt side (added to HEP) Seated neck retraction into towel 3x10 - added to HEP Pec stretch in doorway x30 Blue tband x12 each: standing bil row, bil shoulder ext, bil horiz abd, bil shoulder ER Review of sleeping positions in SL - adding a towel roll under neck gave immediate relief, discussed using pillow to hug to keep scapula in neutral with shoulder in loose pack position Discussed how anything used frequently at her desk (mouse, keyboard) need to be within forearms distance when arms are resting at sides with relaxed neck, discussed height of monitor to maintain neutral neck posture  05/12/24  See pt ed and HEP   PATIENT EDUCATION: Education details: per today's note Person educated: Patient Education method: Explanation, Demonstration, and Handouts Education comprehension: verbalized understanding and returned  demonstration  HOME EXERCISE PROGRAM: Access Code: [email or web address] URL: [email or web address] Date: 05/31/2024 Prepared by: Orvil Beuhring  Exercises - Standing Shoulder Horizontal Abduction with Resistance  - 1 x daily - 3 x weekly - 1-3 sets - 10 reps - Standing Shoulder Row with Anchored Resistance  - 1 x daily - 3 x weekly - 1-3 sets - 10 reps - Shoulder extension with resistance - Neutral  - 1 x daily - 3 x weekly - 1-3 sets - 10 reps - Standing Shoulder Single Arm PNF D2 Flexion with Resistance  - 1 x daily - 3 x weekly - 1-3 sets - 10 reps - Shoulder External Rotation and Scapular Retraction with Resistance  - 1 x daily - 7 x weekly - 1-3 sets - 10 reps - Doorway Pec Stretch at 60 Elevation  - 2 x daily - 7 x weekly - 1 sets - 2 reps - 30 sec hold - Seated Upper Trapezius Stretch  - 3 x daily - 7 x weekly - 1  sets - 1 reps - 30 hold - Seated Levator Scapulae Stretch  - 1 x daily - 7 x weekly - 1 sets - 1 reps - 30 hold - Cervical Retraction with Resistance  - 1 x daily - 7 x weekly - 1 sets - 3 reps - 10 hold  Patient Education - Office Posture  Access Code: [email or web address] URL: [email or web address] Date: 06/02/2024 Prepared by: Saddie Raw  Exercises - Supine Bridge  - 1 x daily - 7 x weekly - 1-3 sets - 10 reps - 20-30 seconds hold - Supine Lower Trunk Rotation  - 1 x daily - 7 x weekly - 1 sets - 10 reps - 5-6 sec hold - Supine Butterfly Groin Stretch  - 1 x daily - 7 x weekly - 1 sets - 2-3 reps - 20-30 seconds hold - Supine Posterior Pelvic Tilt  - 1 x daily - 7 x weekly - 1-3 sets - 10 reps - 20-30 seconds hold - Supine Figure 4 Piriformis Stretch  - 1 x daily - 7 x weekly - 2 sets - 3 reps - 20 seconds hold  ASSESSMENT:  CLINICAL IMPRESSION: Performed assessment for bil hip pain today per pt request.  Pt just started noticing that her hips were getting stiff upon sitting for awhile and when walking for awhile. This stiffness is in the low back as  well.  Her pain is quick and goes away after she starts moving again.  Her ROM and MMT were WNL but does have some low back increased lordosis that improves with PPT/APT.  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  She was very interested in the pool to enable her to address her various pains and we switched some of her visits to aquatic therapy.    OBJECTIVE IMPAIRMENTS: decreased activity tolerance, decreased ROM, decreased strength, increased muscle spasms, impaired flexibility, impaired UE functional use, postural dysfunction, obesity, and pain.   ACTIVITY LIMITATIONS: carrying, lifting, standing, and sleeping  PARTICIPATION LIMITATIONS: meal prep, cleaning, laundry, community activity, occupation, and yard work  PERSONAL FACTORS: Time since onset of injury/illness/exacerbation and 1-2 comorbidities: B hip pain, HTN are also affecting patient's functional outcome.   REHAB POTENTIAL: Excellent  CLINICAL DECISION MAKING: Evolving/moderate complexity  EVALUATION COMPLEXITY: Moderate  GOALS: Goals reviewed with patient? Yes  SHORT TERM GOALS: Target date: 06/02/2024   Patient will be independent with initial HEP.  Baseline:  Goal status: MET 6/25  2.  Improved sleep by 50% secondary to decreased shoulder pain.  Baseline:  Goal status: INITIAL  3.  Decreased shoulder pain by 30% with ADLs Baseline:  Goal status: INITIAL   LONG TERM GOALS: Target date: 07/07/2024   Patient will be independent with advanced/ongoing HEP to improve outcomes and carryover.  Baseline:  Goal status: INITIAL  2.  Patient able to carry and lift without increased shoulder pain. Baseline:  Goal status: INITIAL  3.  Patient to demonstrate functional neck ROM without pain provocation.  Baseline:  Goal status: INITIAL  4.  Patient will demonstrate improved R shoulder and scapular strength to 5/5 in able  to lift and carry without difficulty.  Baseline:  Goal status: INITIAL  5. Increased UEFS score to 72 or better demonstrating improved functional ability.  Baseline: 63 Goal status: INITIAL  6. Pt will be ind with final HEP for hip stretching, core mobility, and postural changes for the  neck and low pain to maximize impact of posture on pain.   NEW   PLAN: PT FREQUENCY: 1-2x/week  PT DURATION: 8 weeks  PLANNED INTERVENTIONS: 02835- PT Re-evaluation, 97110-Therapeutic exercises, 97530- Therapeutic activity, 97112- Neuromuscular re-education, 97535- Self Care, 02859- Manual therapy, [phone]- Aquatic Therapy, [phone]- Electrical stimulation (unattended), [phone] (1-2 muscles), 20561 (3+ muscles)- Dry Needling, Patient/Family education, Joint mobilization, Spinal mobilization, Cryotherapy, and Moist heat  PLAN FOR NEXT SESSION: Review and progress HEP, aquatic visits with general TE, Manual to R UQ, neck ROM, general postural strengthening for neck, back, and bil hip pain, spinal mobs prn   Saddie Raw, PT  06/02/24 12:00 PM

## 2024-06-07 ENCOUNTER — Encounter (HOSPITAL_BASED_OUTPATIENT_CLINIC_OR_DEPARTMENT_OTHER): Payer: Self-pay | Admitting: Physical Therapy

## 2024-06-07 ENCOUNTER — Ambulatory Visit (HOSPITAL_BASED_OUTPATIENT_CLINIC_OR_DEPARTMENT_OTHER): Attending: Orthopaedic Surgery | Admitting: Physical Therapy

## 2024-06-07 ENCOUNTER — Encounter

## 2024-06-07 DIAGNOSIS — M25552 Pain in left hip: Secondary | ICD-10-CM | POA: Diagnosis not present

## 2024-06-07 DIAGNOSIS — M6281 Muscle weakness (generalized): Secondary | ICD-10-CM | POA: Insufficient documentation

## 2024-06-07 DIAGNOSIS — M25551 Pain in right hip: Secondary | ICD-10-CM | POA: Insufficient documentation

## 2024-06-07 DIAGNOSIS — R252 Cramp and spasm: Secondary | ICD-10-CM | POA: Insufficient documentation

## 2024-06-07 NOTE — Therapy (Signed)
 OUTPATIENT PHYSICAL THERAPY UPPER EXTREMITY TREATMENT   Patient Name: Natasha [name] MRN: [id number] DOB:May 10, 1966, 58 y.o., female Today's Date: 06/07/2024  END OF SESSION:  PT End of Session - 06/07/24 1525     Visit Number 4    Date for PT Re-Evaluation 07/07/24    PT Start Time 1531    PT Stop Time 1610    PT Time Calculation (min) 39 min    Activity Tolerance Patient tolerated treatment well    Behavior During Therapy WFL for tasks assessed/performed            Past Medical History:  Diagnosis Date   Colon polyps    Diabetes mellitus without complication (HCC)    pre diabetes   GERD (gastroesophageal reflux disease)    Hypertension    Obesity    Past Surgical History:  Procedure Laterality Date   AUGMENTATION MAMMAPLASTY     GASTRIC BYPASS     Patient Active Problem List   Diagnosis Date Noted   Class 3 severe obesity due to excess calories with serious comorbidity and body mass index (BMI) of 45.0 to 49.9 in adult 03/05/2024   Anxiety 03/05/2024   Other insomnia 03/05/2024   COVID-19 vaccination declined 11/03/2023   Abnormal glucose 12/06/2022   Essential hypertension 12/06/2022   Gastroesophageal reflux disease without esophagitis 12/06/2022   Class 3 drug-induced obesity with serious comorbidity and body mass index (BMI) of 40.0 to 44.9 in adult (HCC) 12/06/2022    PCP: Georgina Speaks, FNP   REFERRING PROVIDER: Genelle Standing, MD   REFERRING DIAG: [phone] (ICD-10-CM) - Chronic right shoulder pain  Scapular dyskinesis program  Anterior chain mobilization  THERAPY DIAG:  Muscle weakness (generalized)  Cramp and spasm  Pain of both hip joints  Rationale for Evaluation and Treatment: Rehabilitation  ONSET DATE: many months ago  SUBJECTIVE:                                                                                                                                                                                      SUBJECTIVE  STATEMENT: No hip pain today.  I don't swim   Hand dominance: Right  PERTINENT HISTORY:  B hip pain, HTN  PAIN:  Are you having pain? Yes: NPRS scale: 1/10 up to 8/10 Pain location: R neck and down medial scapula Pain description: stabbing, aching pain Aggravating factors: unsure/positional Relieving factors: Relief with hand overhead and with overpressure to medial scapula   PRECAUTIONS: None  RED FLAGS: None   WEIGHT BEARING RESTRICTIONS: No  FALLS:  Has patient fallen in last 6 months? No  LIVING ENVIRONMENT: Lives with: lives with their family Lives in:  House/apartment Stairs: Yes: Internal: 15 steps; on right going up Has following equipment at home: None  OCCUPATION: Nurse  PLOF: Independent  PATIENT GOALS: Less pain  NEXT MD VISIT: 6 weeks  OBJECTIVE:  Note: Objective measures were completed at Evaluation unless otherwise noted.  DIAGNOSTIC FINDINGS:  Mild A/C OA  PATIENT SURVEYS :  UEFS  Extreme difficulty/unable (0), Quite a bit of difficulty (1), Moderate difficulty (2), Little difficulty (3), No difficulty (4) Survey date:    Any of your usual work, household or school activities 3  2. Your usual hobbies, recreational/sport activities 0   3. Lifting a bag of groceries to waist level 4   4. Lifting a bag of groceries above your head 3  5. Grooming your hair 3  6. Pushing up on your hands (I.e. from bathtub or chair) 2  7. Preparing food (I.e. peeling/cutting) 4  8. Driving  3  9. Vacuuming, sweeping, or raking 3  10. Dressing  3  11. Doing up buttons 4  12. Using tools/appliances 4  13. Opening doors 4  14. Cleaning  4  15. Tying or lacing shoes 4  16. Sleeping  1  17. Laundering clothes (I.e. washing, ironing, folding) 4  18. Opening a jar 4  19. Throwing a ball 4  20. Carrying a small suitcase with your affected limb.  2  Score total:  63     COGNITION: Overall cognitive status: Within functional limits for tasks  assessed     SENSATION: Not tested  POSTURE: Mild rounded shoulders,   CERVICAL ROM: * into R UT  Active ROM A/PROM (deg) eval  Flexion Full *  Extension 25*  Right lateral flexion 19 *  Left lateral flexion 14 relief  Right rotation 52  Left rotation 45   (Blank rows = not tested)   UPPER EXTREMITY ROM: Full B shoulder ROM, Relief with functional IR behind back   UPPER EXTREMITY MMT:  MMT Right eval Left eval  Shoulder flexion 4+   Shoulder extension 4+   Shoulder abduction 4 mild discomfort   Shoulder adduction    Shoulder internal rotation 5   Shoulder external rotation 5   Middle trapezius 4 5  Lower trapezius 4- 5  (Blank rows = not tested)  SHOULDER SPECIAL TESTS: Impingement tests: Neer impingement test: negative and Hawkins/Kennedy impingement test: negative Rotator cuff assessment: Empty can test: positive  and Full can test: negative  scaption weak  CERVICAL SPECIAL TESTS: Neg compression/distraction, negative spurlings B  PALPATION:  Increased tension and TTP of R UT and levator scap. Pain with UPA mobs to lower cervical and upper thoracic spine  Hip Asessment: 06/02/24 Imaging: IMPRESSION: 1. Mild bilateral femoroacetabular osteoarthritis. 2. Left greater than right lumbosacral assimilation joints with associated moderate degenerative changes. This can be a source of lower back and/or hip pain.  PAIN:  Are you having pain? Yes: NPRS scale: not at rest, up to 7-8/10 when I first stand up Pain location: bil groin area and low back Pain description: achy and stiff Aggravating factors: first standing up to walk, walking too much Relieving factors: TENS, heat, staying active  Subjective:  The hips have been hurting for months now starting when I was walking more.   If I sit down for awhile it gets stiff.  It hurts more in the groin on both sides I also have LBP - they definitiely go together.   Objective:  Lumbar:  AROM  Flexion: hands to  floor Extension:  WNL and feels good Rot B: WNL Sidebend WNL  Hip AROM: WNL Hip PROM: painfree, some pull with cross body position MMT: all 4+/5 without pain Palpation: +1-2 ttp to Rt lateral hip at greater trochanter, +1 to left hip Tightness in quads bil, bil lateral hip Lt>Rt Feels good with lumbar extension standing and prone  Posture: increased lumbar lordosis                                                                                                                              TREATMENT DATE:  [facility] Adult PT Treatment:                                                DATE: 06/07/24 Pt seen for aquatic therapy today.  Treatment took place in water 3.5-4.75 ft in depth at the [facility] pool. Temp of water was 91.  Pt entered/exited the pool via stairs using step to pattern with hand rail.  *Intro to setting *walking forward, back and side stepping in 3.6 ft unsupported *Farmers carry using yellow HB forward and back unilaterally then bilaterally. *Hamstring and gastroc stretching at steps *quad stretching at steps *Ue support yellow HB: toe raises; heel raises; hip add/abd; hip flex/extension; relaxed squats x~10 *straddling noodle ue support corner wall:hip add/abd; hip flex/ext  - ue support yellow  YA:rbropwh across pool.   *Horizontal shoulder add/abd; bow&arrow   Pt requires the buoyancy and hydrostatic pressure of water for support, and to offload joints by unweighting joint load by at least 50 % in navel deep water and by at least 75-80% in chest to neck deep water.  Viscosity of the water is needed for resistance of strengthening. Water current perturbations provides challenge to standing balance requiring increased core activation.     06/02/24 Starting discussing neck/shoulder and then switched to hip evaluation per above for bil hip stiffness and discomfort along with low back stiffness See above Then updated HEP to include hip stretches and low back AROM  with all performed with cueing and instruction as needed.  Also discussed aquatic therapy which pt is very interested in so we switched her to 1x land 1x aquatic   05/31/24: UBE L3 2x2 PT present to discuss plan for session Seated upper trap and levator stretch 2x30 - Rt side (added to HEP) Seated neck retraction into towel 3x10 - added to HEP Pec stretch in doorway x30 Blue tband x12 each: standing bil row, bil shoulder ext, bil horiz abd, bil shoulder ER Review of sleeping positions in SL - adding a towel roll under neck gave immediate relief, discussed using pillow to hug to keep scapula in neutral with shoulder in loose pack position Discussed how anything used frequently at her desk (mouse, keyboard) need to be within forearms distance when arms are resting  at sides with relaxed neck, discussed height of monitor to maintain neutral neck posture  05/12/24  See pt ed and HEP   PATIENT EDUCATION: Education details: per today's note Person educated: Patient Education method: Explanation, Demonstration, and Handouts Education comprehension: verbalized understanding and returned demonstration  HOME EXERCISE PROGRAM: Access Code: [email or web address] URL: [email or web address] Date: 05/31/2024 Prepared by: Orvil Beuhring  Exercises - Standing Shoulder Horizontal Abduction with Resistance  - 1 x daily - 3 x weekly - 1-3 sets - 10 reps - Standing Shoulder Row with Anchored Resistance  - 1 x daily - 3 x weekly - 1-3 sets - 10 reps - Shoulder extension with resistance - Neutral  - 1 x daily - 3 x weekly - 1-3 sets - 10 reps - Standing Shoulder Single Arm PNF D2 Flexion with Resistance  - 1 x daily - 3 x weekly - 1-3 sets - 10 reps - Shoulder External Rotation and Scapular Retraction with Resistance  - 1 x daily - 7 x weekly - 1-3 sets - 10 reps - Doorway Pec Stretch at 60 Elevation  - 2 x daily - 7 x weekly - 1 sets - 2 reps - 30 sec hold - Seated Upper Trapezius Stretch  - 3 x daily -  7 x weekly - 1 sets - 1 reps - 30 hold - Seated Levator Scapulae Stretch  - 1 x daily - 7 x weekly - 1 sets - 1 reps - 30 hold - Cervical Retraction with Resistance  - 1 x daily - 7 x weekly - 1 sets - 3 reps - 10 hold  Patient Education - Office Posture  Access Code: [email or web address] URL: [email or web address] Date: 06/02/2024 Prepared by: Saddie Raw  Exercises - Supine Bridge  - 1 x daily - 7 x weekly - 1-3 sets - 10 reps - 20-30 seconds hold - Supine Lower Trunk Rotation  - 1 x daily - 7 x weekly - 1 sets - 10 reps - 5-6 sec hold - Supine Butterfly Groin Stretch  - 1 x daily - 7 x weekly - 1 sets - 2-3 reps - 20-30 seconds hold - Supine Posterior Pelvic Tilt  - 1 x daily - 7 x weekly - 1-3 sets - 10 reps - 20-30 seconds hold - Supine Figure 4 Piriformis Stretch  - 1 x daily - 7 x weekly - 2 sets - 3 reps - 20 seconds hold  ASSESSMENT:  CLINICAL IMPRESSION: Pt demonstrates safety and independence in aquatic setting with therapist instructing from deck. She demonstrates some apprehension in 4.70ft which improves with as session progresses.  Pt is directed through various movement patterns and trials in both sitting and standing positions. Able to gain sitting balance straddling noodle holding to wall then with some difficulty cycling with ue. Bilat hips with good toleration of movement in all planes.  No reported pain.  Goals are ongoing.       OBJECTIVE IMPAIRMENTS: decreased activity tolerance, decreased ROM, decreased strength, increased muscle spasms, impaired flexibility, impaired UE functional use, postural dysfunction, obesity, and pain.   ACTIVITY LIMITATIONS: carrying, lifting, standing, and sleeping  PARTICIPATION LIMITATIONS: meal prep, cleaning, laundry, community activity, occupation, and yard work  PERSONAL FACTORS: Time since onset of injury/illness/exacerbation and 1-2 comorbidities: B hip pain, HTN are also affecting patient's functional outcome.   REHAB  POTENTIAL: Excellent  CLINICAL DECISION MAKING: Evolving/moderate complexity  EVALUATION COMPLEXITY: Moderate  GOALS: Goals reviewed with patient? Yes  SHORT TERM GOALS: Target date: 06/02/2024   Patient  will be independent with initial HEP.  Baseline:  Goal status: MET 6/25  2.  Improved sleep by 50% secondary to decreased shoulder pain.  Baseline:  Goal status: INITIAL  3.  Decreased shoulder pain by 30% with ADLs Baseline:  Goal status: INITIAL   LONG TERM GOALS: Target date: 07/07/2024   Patient will be independent with advanced/ongoing HEP to improve outcomes and carryover.  Baseline:  Goal status: INITIAL  2.  Patient able to carry and lift without increased shoulder pain. Baseline:  Goal status: INITIAL  3.  Patient to demonstrate functional neck ROM without pain provocation.  Baseline:  Goal status: INITIAL  4.  Patient will demonstrate improved R shoulder and scapular strength to 5/5 in able to lift and carry without difficulty.  Baseline:  Goal status: INITIAL  5. Increased UEFS score to 72 or better demonstrating improved functional ability.  Baseline: 63 Goal status: INITIAL  6. Pt will be ind with final HEP for hip stretching, core mobility, and postural changes for the neck and low pain to maximize impact of posture on pain.   NEW   PLAN: PT FREQUENCY: 1-2x/week  PT DURATION: 8 weeks  PLANNED INTERVENTIONS: 97164- PT Re-evaluation, 97110-Therapeutic exercises, 97530- Therapeutic activity, 97112- Neuromuscular re-education, 97535- Self Care, [id number]- Manual therapy, [phone]- Aquatic Therapy, [phone]- Electrical stimulation (unattended), [phone] (1-2 muscles), [phone] (3+ muscles)- Dry Needling, Patient/Family education, Joint mobilization, Spinal mobilization, Cryotherapy, and Moist heat  PLAN FOR NEXT SESSION: Review and progress HEP, aquatic visits with general TE, Manual to R UQ, neck ROM, general postural strengthening for neck, back, and bil hip pain,  spinal mobs prn   Natasha [name]) Natasha [name] MPT 06/07/24 3:46 PM [facility] [address] [place], [place], [address] Phone: [phone]   Fax:  [phone]   06/07/24 3:46 PM

## 2024-06-08 DIAGNOSIS — F3289 Other specified depressive episodes: Secondary | ICD-10-CM | POA: Diagnosis not present

## 2024-06-08 DIAGNOSIS — F1020 Alcohol dependence, uncomplicated: Secondary | ICD-10-CM | POA: Diagnosis not present

## 2024-06-13 DIAGNOSIS — F3289 Other specified depressive episodes: Secondary | ICD-10-CM | POA: Diagnosis not present

## 2024-06-13 DIAGNOSIS — F1020 Alcohol dependence, uncomplicated: Secondary | ICD-10-CM | POA: Diagnosis not present

## 2024-06-14 ENCOUNTER — Encounter: Admitting: Physical Therapy

## 2024-06-14 ENCOUNTER — Ambulatory Visit (HOSPITAL_BASED_OUTPATIENT_CLINIC_OR_DEPARTMENT_OTHER): Admitting: Physical Therapy

## 2024-06-14 ENCOUNTER — Ambulatory Visit (HOSPITAL_BASED_OUTPATIENT_CLINIC_OR_DEPARTMENT_OTHER): Admitting: Orthopaedic Surgery

## 2024-06-16 ENCOUNTER — Encounter: Admitting: Physical Therapy

## 2024-06-21 ENCOUNTER — Encounter

## 2024-06-21 ENCOUNTER — Ambulatory Visit: Attending: Orthopaedic Surgery | Admitting: Physical Therapy

## 2024-06-21 ENCOUNTER — Encounter: Payer: Self-pay | Admitting: Physical Therapy

## 2024-06-21 DIAGNOSIS — G8929 Other chronic pain: Secondary | ICD-10-CM | POA: Insufficient documentation

## 2024-06-21 DIAGNOSIS — R252 Cramp and spasm: Secondary | ICD-10-CM | POA: Diagnosis not present

## 2024-06-21 DIAGNOSIS — M6281 Muscle weakness (generalized): Secondary | ICD-10-CM | POA: Insufficient documentation

## 2024-06-21 DIAGNOSIS — M542 Cervicalgia: Secondary | ICD-10-CM | POA: Insufficient documentation

## 2024-06-21 DIAGNOSIS — M25552 Pain in left hip: Secondary | ICD-10-CM | POA: Insufficient documentation

## 2024-06-21 DIAGNOSIS — M25511 Pain in right shoulder: Secondary | ICD-10-CM | POA: Insufficient documentation

## 2024-06-21 DIAGNOSIS — M25551 Pain in right hip: Secondary | ICD-10-CM | POA: Diagnosis not present

## 2024-06-21 NOTE — Therapy (Signed)
 OUTPATIENT PHYSICAL THERAPY UPPER EXTREMITY TREATMENT   Patient Name: Natasha [name] MRN: [id number] DOB:[birth year], 58 y.o., female Today's Date: 06/21/2024  END OF SESSION:  PT End of Session - 06/21/24 1149     Visit Number 5    Date for PT Re-Evaluation 07/07/24    Authorization Type [facility] [employer]    PT Start Time 1148    PT Stop Time 1220    PT Time Calculation (min) 32 min    Activity Tolerance Patient tolerated treatment well;Patient limited by pain    Behavior During Therapy WFL for tasks assessed/performed             Past Medical History:  Diagnosis Date   Colon polyps    Diabetes mellitus without complication (HCC)    pre diabetes   GERD (gastroesophageal reflux disease)    Hypertension    Obesity    Past Surgical History:  Procedure Laterality Date   AUGMENTATION MAMMAPLASTY     GASTRIC BYPASS     Patient Active Problem List   Diagnosis Date Noted   Class 3 severe obesity due to excess calories with serious comorbidity and body mass index (BMI) of 45.0 to 49.9 in adult 03/05/2024   Anxiety 03/05/2024   Other insomnia 03/05/2024   COVID-19 vaccination declined 11/03/2023   Abnormal glucose 12/06/2022   Essential hypertension 12/06/2022   Gastroesophageal reflux disease without esophagitis 12/06/2022   Class 3 drug-induced obesity with serious comorbidity and body mass index (BMI) of 40.0 to 44.9 in adult (HCC) 12/06/2022    PCP: Georgina Speaks, FNP   REFERRING PROVIDER: Genelle Standing, MD   REFERRING DIAG: [phone] (ICD-10-CM) - Chronic right shoulder pain  Scapular dyskinesis program  Anterior chain mobilization  THERAPY DIAG:  Muscle weakness (generalized)  Cramp and spasm  Pain of both hip joints  Chronic right shoulder pain  Rationale for Evaluation and Treatment: Rehabilitation  ONSET DATE: many months ago  SUBJECTIVE:                                                                                                                                                                                       SUBJECTIVE STATEMENT: I have a lot of groin pain today (RT). I can be walking and all of a sudden my hip locks up.   Hand dominance: Right  PERTINENT HISTORY:  B hip pain, HTN  PAIN:  Are you having pain? Yes, Rt groin 8/10 with weightbearing and when I make certain movements. Better when sitting.  PRECAUTIONS: None  RED FLAGS: None   WEIGHT BEARING RESTRICTIONS: No  FALLS:  Has patient fallen in last 6 months? No  LIVING  ENVIRONMENT: Lives with: lives with their family Lives in: House/apartment Stairs: Yes: Internal: 15 steps; on right going up Has following equipment at home: None  OCCUPATION: Nurse  PLOF: Independent  PATIENT GOALS: Less pain  NEXT MD VISIT: 6 weeks  OBJECTIVE:  Note: Objective measures were completed at Evaluation unless otherwise noted.  DIAGNOSTIC FINDINGS:  Mild A/C OA  PATIENT SURVEYS :  UEFS  Extreme difficulty/unable (0), Quite a bit of difficulty (1), Moderate difficulty (2), Little difficulty (3), No difficulty (4) Survey date:    Any of your usual work, household or school activities 3  2. Your usual hobbies, recreational/sport activities 0   3. Lifting a bag of groceries to waist level 4   4. Lifting a bag of groceries above your head 3  5. Grooming your hair 3  6. Pushing up on your hands (I.e. from bathtub or chair) 2  7. Preparing food (I.e. peeling/cutting) 4  8. Driving  3  9. Vacuuming, sweeping, or raking 3  10. Dressing  3  11. Doing up buttons 4  12. Using tools/appliances 4  13. Opening doors 4  14. Cleaning  4  15. Tying or lacing shoes 4  16. Sleeping  1  17. Laundering clothes (I.e. washing, ironing, folding) 4  18. Opening a jar 4  19. Throwing a ball 4  20. Carrying a small suitcase with your affected limb.  2  Score total:  63     COGNITION: Overall cognitive status: Within functional limits for tasks  assessed     SENSATION: Not tested  POSTURE: Mild rounded shoulders,   CERVICAL ROM: * into R UT  Active ROM A/PROM (deg) eval  Flexion Full *  Extension 25*  Right lateral flexion 19 *  Left lateral flexion 14 relief  Right rotation 52  Left rotation 45   (Blank rows = not tested)   UPPER EXTREMITY ROM: Full B shoulder ROM, Relief with functional IR behind back   UPPER EXTREMITY MMT:  MMT Right eval Left eval  Shoulder flexion 4+   Shoulder extension 4+   Shoulder abduction 4 mild discomfort   Shoulder adduction    Shoulder internal rotation 5   Shoulder external rotation 5   Middle trapezius 4 5  Lower trapezius 4- 5  (Blank rows = not tested)  SHOULDER SPECIAL TESTS: Impingement tests: Neer impingement test: negative and Hawkins/Kennedy impingement test: negative Rotator cuff assessment: Empty can test: positive  and Full can test: negative  scaption weak  CERVICAL SPECIAL TESTS: Neg compression/distraction, negative spurlings B  PALPATION:  Increased tension and TTP of R UT and levator scap. Pain with UPA mobs to lower cervical and upper thoracic spine  Hip Asessment: 06/02/24 Imaging: IMPRESSION: 1. Mild bilateral femoroacetabular osteoarthritis. 2. Left greater than right lumbosacral assimilation joints with associated moderate degenerative changes. This can be a source of lower back and/or hip pain.  PAIN:  Are you having pain? Yes: NPRS scale: not at rest, up to 7-8/10 when I first stand up Pain location: bil groin area and low back Pain description: achy and stiff Aggravating factors: first standing up to walk, walking too much Relieving factors: TENS, heat, staying active  Subjective:  The hips have been hurting for months now starting when I was walking more.   If I sit down for awhile it gets stiff.  It hurts more in the groin on both sides I also have LBP - they definitiely go together.   Objective:  Lumbar:  AROM  Flexion: hands to  floor Extension: WNL and feels good Rot B: WNL Sidebend WNL  Hip AROM: WNL Hip PROM: painfree, some pull with cross body position MMT: all 4+/5 without pain Palpation: +1-2 ttp to Rt lateral hip at greater trochanter, +1 to left hip Tightness in quads bil, bil lateral hip Lt>Rt Feels good with lumbar extension standing and prone  Posture: increased lumbar lordosis                                                                                                                              TREATMENT DATE:   06/21/24: Pt seen for aquatic therapy today.  Treatment took place in water 3.5-4.75 ft in depth at the [facility] pool. Temp of water was 91.  Pt entered/exited the pool via stairs using step to pattern with hand rail. Pt requires the buoyancy and hydrostatic pressure of water for support, and to offload joints by unweighting joint load by at least 50 % in navel deep water and by at least 75-80% in chest to neck deep water.  Viscosity of the water is needed for resistance of strengthening. Water current perturbations provides challenge to standing balance requiring increased core activation. Seated: ankle and knee AROM while discussing her status. Seated decompression with PTA assist for trunk control 5 min for pain reduction.75% depth for standing hip 3 ways in pain free Rom 10x each Bil.     [facility] Adult PT Treatment:                                                DATE: 06/07/24 Pt seen for aquatic therapy today.  Treatment took place in water 3.5-4.75 ft in depth at the [facility] pool. Temp of water was 91.  Pt entered/exited the pool via stairs using step to pattern with hand rail.  *Intro to setting *walking forward, back and side stepping in 3.6 ft unsupported *Farmers carry using yellow HB forward and back unilaterally then bilaterally. *Hamstring and gastroc stretching at steps *quad stretching at steps *Ue support yellow HB: toe raises; heel raises; hip  add/abd; hip flex/extension; relaxed squats x~10 *straddling noodle ue support corner wall:hip add/abd; hip flex/ext  - ue support yellow  YA:rbropwh across pool.   *Horizontal shoulder add/abd; bow&arrow   Pt requires the buoyancy and hydrostatic pressure of water for support, and to offload joints by unweighting joint load by at least 50 % in navel deep water and by at least 75-80% in chest to neck deep water.  Viscosity of the water is needed for resistance of strengthening. Water current perturbations provides challenge to standing balance requiring increased core activation.     06/02/24 Starting discussing neck/shoulder and then switched to hip evaluation per above for bil hip stiffness and discomfort along with low back stiffness  See above Then updated HEP to include hip stretches and low back AROM with all performed with cueing and instruction as needed.  Also discussed aquatic therapy which pt is very interested in so we switched her to 1x land 1x aquatic   PATIENT EDUCATION: Education details: per today's note Person educated: Patient Education method: Explanation, Demonstration, and Handouts Education comprehension: verbalized understanding and returned demonstration  HOME EXERCISE PROGRAM: Access Code: [email or web address] URL: [email or web address] Date: 05/31/2024 Prepared by: Orvil Beuhring  Exercises - Standing Shoulder Horizontal Abduction with Resistance  - 1 x daily - 3 x weekly - 1-3 sets - 10 reps - Standing Shoulder Row with Anchored Resistance  - 1 x daily - 3 x weekly - 1-3 sets - 10 reps - Shoulder extension with resistance - Neutral  - 1 x daily - 3 x weekly - 1-3 sets - 10 reps - Standing Shoulder Single Arm PNF D2 Flexion with Resistance  - 1 x daily - 3 x weekly - 1-3 sets - 10 reps - Shoulder External Rotation and Scapular Retraction with Resistance  - 1 x daily - 7 x weekly - 1-3 sets - 10 reps - Doorway Pec Stretch at 60 Elevation  - 2 x daily - 7  x weekly - 1 sets - 2 reps - 30 sec hold - Seated Upper Trapezius Stretch  - 3 x daily - 7 x weekly - 1 sets - 1 reps - 30 hold - Seated Levator Scapulae Stretch  - 1 x daily - 7 x weekly - 1 sets - 1 reps - 30 hold - Cervical Retraction with Resistance  - 1 x daily - 7 x weekly - 1 sets - 3 reps - 10 hold  Patient Education - Office Posture  Access Code: [email or web address] URL: [email or web address] Date: 06/02/2024 Prepared by: Saddie Raw  Exercises - Supine Bridge  - 1 x daily - 7 x weekly - 1-3 sets - 10 reps - 20-30 seconds hold - Supine Lower Trunk Rotation  - 1 x daily - 7 x weekly - 1 sets - 10 reps - 5-6 sec hold - Supine Butterfly Groin Stretch  - 1 x daily - 7 x weekly - 1 sets - 2-3 reps - 20-30 seconds hold - Supine Posterior Pelvic Tilt  - 1 x daily - 7 x weekly - 1-3 sets - 10 reps - 20-30 seconds hold - Supine Figure 4 Piriformis Stretch  - 1 x daily - 7 x weekly - 2 sets - 3 reps - 20 seconds hold  ASSESSMENT:  CLINICAL IMPRESSION: Pt arrives with significant Rt groin pain today. (8/10) We put her in a position of NWB and a long axis traction which abolished the pain. Pt could  do standing hip movements that were not painful. She is about to go out of town so we did not walk incase of exacerbation. She was educated on things she could do on her trip in a pool to help her condition. PTA also suggested she take her antiinflammatories to see if some of her pain was in fact related to inflammation.      OBJECTIVE IMPAIRMENTS: decreased activity tolerance, decreased ROM, decreased strength, increased muscle spasms, impaired flexibility, impaired UE functional use, postural dysfunction, obesity, and pain.   ACTIVITY LIMITATIONS: carrying, lifting, standing, and sleeping  PARTICIPATION LIMITATIONS: meal prep, cleaning, laundry, community activity, occupation, and yard work  PERSONAL FACTORS: Time since onset of injury/illness/exacerbation and 1-2 comorbidities: B hip  pain, HTN are also affecting patient's  functional outcome.   REHAB POTENTIAL: Excellent  CLINICAL DECISION MAKING: Evolving/moderate complexity  EVALUATION COMPLEXITY: Moderate  GOALS: Goals reviewed with patient? Yes  SHORT TERM GOALS: Target date: 06/02/2024   Patient will be independent with initial HEP.  Baseline:  Goal status: MET 6/25  2.  Improved sleep by 50% secondary to decreased shoulder pain.  Baseline:  Goal status: INITIAL  3.  Decreased shoulder pain by 30% with ADLs Baseline:  Goal status: INITIAL   LONG TERM GOALS: Target date: 07/07/2024   Patient will be independent with advanced/ongoing HEP to improve outcomes and carryover.  Baseline:  Goal status: INITIAL  2.  Patient able to carry and lift without increased shoulder pain. Baseline:  Goal status: INITIAL  3.  Patient to demonstrate functional neck ROM without pain provocation.  Baseline:  Goal status: INITIAL  4.  Patient will demonstrate improved R shoulder and scapular strength to 5/5 in able to lift and carry without difficulty.  Baseline:  Goal status: INITIAL  5. Increased UEFS score to 72 or better demonstrating improved functional ability.  Baseline: 63 Goal status: INITIAL  6. Pt will be ind with final HEP for hip stretching, core mobility, and postural changes for the neck and low pain to maximize impact of posture on pain.   NEW   PLAN: PT FREQUENCY: 1-2x/week  PT DURATION: 8 weeks  PLANNED INTERVENTIONS: 97164- PT Re-evaluation, 97110-Therapeutic exercises, 97530- Therapeutic activity, 97112- Neuromuscular re-education, 97535- Self Care, 02859- Manual therapy, [phone]- Aquatic Therapy, [phone]- Electrical stimulation (unattended), [phone] (1-2 muscles), [id number] (3+ muscles)- Dry Needling, Patient/Family education, Joint mobilization, Spinal mobilization, Cryotherapy, and Moist heat  PLAN FOR NEXT SESSION: Review and progress HEP, aquatic visits with general TE, Manual to R UQ, neck ROM,  general postural strengthening for neck, back, and bil hip pain, spinal mobs prn   Delon Darner, PTA 06/21/24 12:38 PM   [facility] [address] [place], [place], [address] Phone: [phone]   Fax:  [phone]   06/21/24 12:38 PM

## 2024-06-22 DIAGNOSIS — F1020 Alcohol dependence, uncomplicated: Secondary | ICD-10-CM | POA: Diagnosis not present

## 2024-06-22 DIAGNOSIS — F3289 Other specified depressive episodes: Secondary | ICD-10-CM | POA: Diagnosis not present

## 2024-06-23 ENCOUNTER — Encounter: Admitting: Physical Therapy

## 2024-06-27 NOTE — Therapy (Incomplete)
 OUTPATIENT PHYSICAL THERAPY UPPER AND LOWER EXTREMITY TREATMENT   Patient Name: Natasha [name] MRN: [id number] DOB:04/18/1966, 58 y.o., female Today's Date: 06/27/2024  END OF SESSION:       Past Medical History:  Diagnosis Date   Colon polyps    Diabetes mellitus without complication (HCC)    pre diabetes   GERD (gastroesophageal reflux disease)    Hypertension    Obesity    Past Surgical History:  Procedure Laterality Date   AUGMENTATION MAMMAPLASTY     GASTRIC BYPASS     Patient Active Problem List   Diagnosis Date Noted   Class 3 severe obesity due to excess calories with serious comorbidity and body mass index (BMI) of 45.0 to 49.9 in adult 03/05/2024   Anxiety 03/05/2024   Other insomnia 03/05/2024   COVID-19 vaccination declined 11/03/2023   Abnormal glucose 12/06/2022   Essential hypertension 12/06/2022   Gastroesophageal reflux disease without esophagitis 12/06/2022   Class 3 drug-induced obesity with serious comorbidity and body mass index (BMI) of 40.0 to 44.9 in adult [facility]) 12/06/2022    PCP: Georgina Speaks, FNP   REFERRING PROVIDER: Genelle Standing, MD   REFERRING DIAG: [id number] (ICD-10-CM) - Chronic right shoulder pain  Scapular dyskinesis program  Anterior chain mobilization  THERAPY DIAG:  No diagnosis found.  Rationale for Evaluation and Treatment: Rehabilitation  ONSET DATE: many months ago  SUBJECTIVE:                                                                                                                                                                                      SUBJECTIVE STATEMENT: ***   Hand dominance: Right  PERTINENT HISTORY:  B hip pain, HTN  PAIN:  Are you having pain? Yes, Rt groin 8/10 with weightbearing and when I make certain movements. Better when sitting.  PRECAUTIONS: None  RED FLAGS: None   WEIGHT BEARING RESTRICTIONS: No  FALLS:  Has patient fallen in last 6 months? No  LIVING  ENVIRONMENT: Lives with: lives with their family Lives in: House/apartment Stairs: Yes: Internal: 15 steps; on right going up Has following equipment at home: None  OCCUPATION: Nurse  PLOF: Independent  PATIENT GOALS: Less pain  NEXT MD VISIT: 6 weeks  OBJECTIVE:  Note: Objective measures were completed at Evaluation unless otherwise noted.  DIAGNOSTIC FINDINGS:  Mild A/C OA  PATIENT SURVEYS :  UEFS  Extreme difficulty/unable (0), Quite a bit of difficulty (1), Moderate difficulty (2), Little difficulty (3), No difficulty (4) Survey date:    Any of your usual work, household or school activities 3  2. Your usual hobbies, recreational/sport activities 0   3. Lifting a  bag of groceries to waist level 4   4. Lifting a bag of groceries above your head 3  5. Grooming your hair 3  6. Pushing up on your hands (I.e. from bathtub or chair) 2  7. Preparing food (I.e. peeling/cutting) 4  8. Driving  3  9. Vacuuming, sweeping, or raking 3  10. Dressing  3  11. Doing up buttons 4  12. Using tools/appliances 4  13. Opening doors 4  14. Cleaning  4  15. Tying or lacing shoes 4  16. Sleeping  1  17. Laundering clothes (I.e. washing, ironing, folding) 4  18. Opening a jar 4  19. Throwing a ball 4  20. Carrying a small suitcase with your affected limb.  2  Score total:  63     COGNITION: Overall cognitive status: Within functional limits for tasks assessed     SENSATION: Not tested  POSTURE: Mild rounded shoulders,   CERVICAL ROM: * into R UT  Active ROM A/PROM (deg) eval  Flexion Full *  Extension 25*  Right lateral flexion 19 *  Left lateral flexion 14 relief  Right rotation 52  Left rotation 45   (Blank rows = not tested)   UPPER EXTREMITY ROM: Full B shoulder ROM, Relief with functional IR behind back   UPPER EXTREMITY MMT:  MMT Right eval Left eval  Shoulder flexion 4+   Shoulder extension 4+   Shoulder abduction 4 mild discomfort   Shoulder  adduction    Shoulder internal rotation 5   Shoulder external rotation 5   Middle trapezius 4 5  Lower trapezius 4- 5  (Blank rows = not tested)  SHOULDER SPECIAL TESTS: Impingement tests: Neer impingement test: negative and Hawkins/Kennedy impingement test: negative Rotator cuff assessment: Empty can test: positive  and Full can test: negative  scaption weak  CERVICAL SPECIAL TESTS: Neg compression/distraction, negative spurlings B  PALPATION:  Increased tension and TTP of R UT and levator scap. Pain with UPA mobs to lower cervical and upper thoracic spine  Hip Asessment: 06/02/24 Imaging: IMPRESSION: 1. Mild bilateral femoroacetabular osteoarthritis. 2. Left greater than right lumbosacral assimilation joints with associated moderate degenerative changes. This can be a source of lower back and/or hip pain.  PAIN:  Are you having pain? Yes: NPRS scale: not at rest, up to 7-8/10 when I first stand up Pain location: bil groin area and low back Pain description: achy and stiff Aggravating factors: first standing up to walk, walking too much Relieving factors: TENS, heat, staying active  Subjective:  The hips have been hurting for months now starting when I was walking more.   If I sit down for awhile it gets stiff.  It hurts more in the groin on both sides I also have LBP - they definitiely go together.   Objective:  Lumbar:  AROM  Flexion: hands to floor Extension: WNL and feels good Rot B: WNL Sidebend WNL  Hip AROM: WNL Hip PROM: painfree, some pull with cross body position MMT: all 4+/5 without pain Palpation: +1-2 ttp to Rt lateral hip at greater trochanter, +1 to left hip Tightness in quads bil, bil lateral hip Lt>Rt Feels good with lumbar extension standing and prone  Posture: increased lumbar lordosis  TREATMENT DATE:   06/28/24 Hips/neck? Check goals ***   06/21/24: Pt seen for aquatic therapy today.  Treatment took place in water 3.5-4.75 ft in depth at the [facility] pool. Temp of water was 91.  Pt entered/exited the pool via stairs using step to pattern with hand rail. Pt requires the buoyancy and hydrostatic pressure of water for support, and to offload joints by unweighting joint load by at least 50 % in navel deep water and by at least 75-80% in chest to neck deep water.  Viscosity of the water is needed for resistance of strengthening. Water current perturbations provides challenge to standing balance requiring increased core activation. Seated: ankle and knee AROM while discussing her status. Seated decompression with PTA assist for trunk control 5 min for pain reduction.75% depth for standing hip 3 ways in pain free Rom 10x each Bil.     [facility] Adult PT Treatment:                                                DATE: 06/07/24 Pt seen for aquatic therapy today.  Treatment took place in water 3.5-4.75 ft in depth at the [facility] pool. Temp of water was 91.  Pt entered/exited the pool via stairs using step to pattern with hand rail.  *Intro to setting *walking forward, back and side stepping in 3.6 ft unsupported *Farmers carry using yellow HB forward and back unilaterally then bilaterally. *Hamstring and gastroc stretching at steps *quad stretching at steps *Ue support yellow HB: toe raises; heel raises; hip add/abd; hip flex/extension; relaxed squats x~10 *straddling noodle ue support corner wall:hip add/abd; hip flex/ext  - ue support yellow  YA:rbropwh across pool.   *Horizontal shoulder add/abd; bow&arrow   Pt requires the buoyancy and hydrostatic pressure of water for support, and to offload joints by unweighting joint load by at least 50 % in navel deep water and by at least 75-80% in chest to neck deep water.  Viscosity of the water is needed for resistance of strengthening.  Water current perturbations provides challenge to standing balance requiring increased core activation.     06/02/24 Starting discussing neck/shoulder and then switched to hip evaluation per above for bil hip stiffness and discomfort along with low back stiffness See above Then updated HEP to include hip stretches and low back AROM with all performed with cueing and instruction as needed.  Also discussed aquatic therapy which pt is very interested in so we switched her to 1x land 1x aquatic   PATIENT EDUCATION: Education details: per today's note Person educated: Patient Education method: Explanation, Demonstration, and Handouts Education comprehension: verbalized understanding and returned demonstration  HOME EXERCISE PROGRAM: Access Code: [email or web address] URL: [email or web address] Date: 05/31/2024 Prepared by: Orvil Beuhring  Exercises - Standing Shoulder Horizontal Abduction with Resistance  - 1 x daily - 3 x weekly - 1-3 sets - 10 reps - Standing Shoulder Row with Anchored Resistance  - 1 x daily - 3 x weekly - 1-3 sets - 10 reps - Shoulder extension with resistance - Neutral  - 1 x daily - 3 x weekly - 1-3 sets - 10 reps - Standing Shoulder Single Arm PNF D2 Flexion with Resistance  - 1 x daily - 3 x weekly - 1-3 sets - 10 reps - Shoulder External Rotation and Scapular Retraction with Resistance  - 1 x  daily - 7 x weekly - 1-3 sets - 10 reps - Doorway Pec Stretch at 60 Elevation  - 2 x daily - 7 x weekly - 1 sets - 2 reps - 30 sec hold - Seated Upper Trapezius Stretch  - 3 x daily - 7 x weekly - 1 sets - 1 reps - 30 hold - Seated Levator Scapulae Stretch  - 1 x daily - 7 x weekly - 1 sets - 1 reps - 30 hold - Cervical Retraction with Resistance  - 1 x daily - 7 x weekly - 1 sets - 3 reps - 10 hold  Patient Education - Office Posture  Access Code: [email or web address] URL: [email or web address] Date: 06/02/2024 Prepared by: Saddie Raw  Exercises - Supine Bridge   - 1 x daily - 7 x weekly - 1-3 sets - 10 reps - 20-30 seconds hold - Supine Lower Trunk Rotation  - 1 x daily - 7 x weekly - 1 sets - 10 reps - 5-6 sec hold - Supine Butterfly Groin Stretch  - 1 x daily - 7 x weekly - 1 sets - 2-3 reps - 20-30 seconds hold - Supine Posterior Pelvic Tilt  - 1 x daily - 7 x weekly - 1-3 sets - 10 reps - 20-30 seconds hold - Supine Figure 4 Piriformis Stretch  - 1 x daily - 7 x weekly - 2 sets - 3 reps - 20 seconds hold  ASSESSMENT:  CLINICAL IMPRESSION: ***     OBJECTIVE IMPAIRMENTS: decreased activity tolerance, decreased ROM, decreased strength, increased muscle spasms, impaired flexibility, impaired UE functional use, postural dysfunction, obesity, and pain.   ACTIVITY LIMITATIONS: carrying, lifting, standing, and sleeping  PARTICIPATION LIMITATIONS: meal prep, cleaning, laundry, community activity, occupation, and yard work  PERSONAL FACTORS: Time since onset of injury/illness/exacerbation and 1-2 comorbidities: B hip pain, HTN are also affecting patient's functional outcome.   REHAB POTENTIAL: Excellent  CLINICAL DECISION MAKING: Evolving/moderate complexity  EVALUATION COMPLEXITY: Moderate  GOALS: Goals reviewed with patient? Yes  SHORT TERM GOALS: Target date: 06/02/2024   Patient will be independent with initial HEP.  Baseline:  Goal status: MET 6/25  2.  Improved sleep by 50% secondary to decreased shoulder pain.  Baseline:  Goal status: INITIAL  3.  Decreased shoulder pain by 30% with ADLs Baseline:  Goal status: INITIAL   LONG TERM GOALS: Target date: 07/07/2024   Patient will be independent with advanced/ongoing HEP to improve outcomes and carryover.  Baseline:  Goal status: INITIAL  2.  Patient able to carry and lift without increased shoulder pain. Baseline:  Goal status: INITIAL  3.  Patient to demonstrate functional neck ROM without pain provocation.  Baseline:  Goal status: INITIAL  4.  Patient will demonstrate  improved R shoulder and scapular strength to 5/5 in able to lift and carry without difficulty.  Baseline:  Goal status: INITIAL  5. Increased UEFS score to 72 or better demonstrating improved functional ability.  Baseline: 63 Goal status: INITIAL  6. Pt will be ind with final HEP for hip stretching, core mobility, and postural changes for the neck and low pain to maximize impact of posture on pain.   NEW   PLAN: PT FREQUENCY: 1-2x/week  PT DURATION: 8 weeks  PLANNED INTERVENTIONS: 97164- PT Re-evaluation, 97110-Therapeutic exercises, 97530- Therapeutic activity, 97112- Neuromuscular re-education, 97535- Self Care, 02859- Manual therapy, [phone]- Aquatic Therapy, [phone]- Electrical stimulation (unattended), [phone] (1-2 muscles), 20561 (3+ muscles)- Dry Needling, Patient/Family education, Joint mobilization, Spinal mobilization, Cryotherapy, and Moist  heat  PLAN FOR NEXT SESSION: Review and progress HEP, aquatic visits with general TE, Manual to R UQ, neck ROM, general postural strengthening for neck, back, and bil hip pain, spinal mobs prn   Mliss Cummins, PT  06/27/24 9:27 PM   [facility] [address] [place], [place], [address] Phone: [phone]   Fax:  [phone]   06/27/24 9:27 PM

## 2024-06-28 ENCOUNTER — Encounter: Payer: Self-pay | Admitting: Nurse Practitioner

## 2024-06-28 ENCOUNTER — Ambulatory Visit: Admitting: Physical Therapy

## 2024-06-28 ENCOUNTER — Ambulatory Visit: Admitting: Nurse Practitioner

## 2024-06-28 VITALS — BP 110/70 | HR 70 | Temp 98.0°F | Ht 67.0 in | Wt 301.4 lb

## 2024-06-28 DIAGNOSIS — M25551 Pain in right hip: Secondary | ICD-10-CM | POA: Diagnosis not present

## 2024-06-28 DIAGNOSIS — R7309 Other abnormal glucose: Secondary | ICD-10-CM | POA: Diagnosis not present

## 2024-06-28 DIAGNOSIS — D72818 Other decreased white blood cell count: Secondary | ICD-10-CM | POA: Diagnosis not present

## 2024-06-28 DIAGNOSIS — Z6841 Body Mass Index (BMI) 40.0 and over, adult: Secondary | ICD-10-CM

## 2024-06-28 DIAGNOSIS — I10 Essential (primary) hypertension: Secondary | ICD-10-CM

## 2024-06-28 DIAGNOSIS — E66813 Obesity, class 3: Secondary | ICD-10-CM | POA: Diagnosis not present

## 2024-06-28 DIAGNOSIS — Z139 Encounter for screening, unspecified: Secondary | ICD-10-CM | POA: Diagnosis not present

## 2024-06-28 NOTE — Progress Notes (Signed)
 LILLETTE Kristeen JINNY Gladis, CMA,acting as a Neurosurgeon for Natasha Ada, FNP.,have documented all relevant documentation on the behalf of Natasha Ada, FNP,as directed by  Natasha Ada, FNP while in the presence of Natasha Ada, FNP.  Subjective:  Patient ID: Natasha [name] , female    DOB: December 25, 1965 , 58 y.o.   MRN: [id number]  Chief Complaint  Patient presents with   Neutropenia    Patient presents today for low WBC on her lab work from 05/15/2024. Her levels were 2.9.     HPI  Discussed the use of AI scribe software for clinical note transcription with the patient, who gave verbal consent to proceed.  History of Present Illness Natasha [name] is a 58 year old female who presents with low white blood cell count and weight loss management.  She presents for evaluation of a low white blood cell count discovered during a workup for weight loss management. No recent illnesses or viral infections. Her last white blood cell count was 5.6 a few years ago, which was within normal limits. No history of low white blood cell count. No symptoms such as swollen lymph nodes, difficulty swallowing, fevers, or chills.  She is managing osteoarthritis, with her right hip causing more pain than the left. The pain sometimes radiates to the groin area, making it difficult to walk. She has been undergoing aqua therapy but had to stop due to increased hip pain. She has been prescribed meloxicam , which she takes as needed, and has three doses remaining. An MRI was performed to rule out a tear.  For weight loss management, she has been taking vitamin B12 supplements daily, but after noticing her B12 levels were greater than 2000, she reduced the intake to once a week. She is currently working part-time at a hospital.     Past Medical History:  Diagnosis Date   Colon polyps    Diabetes mellitus without complication (HCC)    pre diabetes   GERD (gastroesophageal reflux disease)    Hypertension    Obesity      Family  History  Problem Relation Age of Onset   Kidney disease Mother    Hypertension Mother    Heart disease Father        Na   Diabetes Father    Colon cancer Maternal Grandmother      Current Outpatient Medications:    amLODipine  (NORVASC ) 5 MG tablet, TAKE 1 TABLET BY MOUTH DAILY. PT. MUST MAKE AN APPOINTMENT IN ORDER TO RECEIVE FUTURE REFILLS, Disp: 90 tablet, Rfl: 1   Blood Glucose Monitoring Suppl (FREESTYLE LITE) w/Device KIT, 1 each by Does not apply route 3 (three) times daily. DX: E11.65, Disp: 1 kit, Rfl: 1   cyclobenzaprine  (FLEXERIL ) 10 MG tablet, Take 1 tablet (10 mg total) by mouth 3 (three) times daily as needed for muscle spasms., Disp: 30 tablet, Rfl: 0   gabapentin  (NEURONTIN ) 300 MG capsule, Take 1 capsule (300 mg total) by mouth 3 (three) times daily., Disp: 90 capsule, Rfl: 2   gabapentin  (NEURONTIN ) 800 MG tablet, Take 1 tablet (800 mg total) by mouth at bedtime., Disp: 90 tablet, Rfl: 3   hydrOXYzine  (ATARAX ) 25 MG tablet, Take 1 tablet (25 mg total) by mouth 3 (three) times daily as needed., Disp: 30 tablet, Rfl: 0   Insulin  Pen Needle (NOVOFINE) 30G X 8 MM MISC, USE AS DIRECTED WITH SAXENDA , Disp: 100 each, Rfl: 2   Lancets (FREESTYLE) lancets, Use to check blood sugars 3 times a week  DX: E11.65, Disp: 150 each, Rfl: 3   metFORMIN  (GLUCOPHAGE ) 500 MG tablet, Take 1 tablet (500 mg total) by mouth 2 (two) times daily with a meal., Disp: 60 tablet, Rfl: 2   telmisartan -hydrochlorothiazide  (MICARDIS  HCT) 80-25 MG tablet, TAKE 1 TABLET BY MOUTH EVERY DAY, Disp: 90 tablet, Rfl: 3   thiamine (VITAMIN B-1) 100 MG tablet, Take 100 mg by mouth daily., Disp: , Rfl:    vitamin B-12 (CYANOCOBALAMIN ) 500 MCG tablet, Take 500 mcg by mouth daily., Disp: , Rfl:    meloxicam  (MOBIC ) 15 MG tablet, Take 1 tablet (15 mg total) by mouth daily., Disp: 14 tablet, Rfl: 4   Semaglutide -Weight Management (WEGOVY ) 0.5 MG/0.5ML SOAJ, Inject 0.5 mg into the skin once a week. (Patient not taking:  Reported on 07/14/2024), Disp: 2 mL, Rfl: 1   Semaglutide -Weight Management (WEGOVY ) 1 MG/0.5ML SOAJ, Inject 1 mg into the skin once a week. (Patient not taking: Reported on 07/14/2024), Disp: 2 mL, Rfl: 1   No Known Allergies   Review of Systems  Constitutional: Negative.   Respiratory: Negative.    Cardiovascular: Negative.   Neurological: Negative.   Psychiatric/Behavioral: Negative.       Today's Vitals   06/28/24 1148  BP: 110/70  Pulse: 70  Temp: 98 F (36.7 C)  TempSrc: Oral  Weight: (!) 301 lb 6.4 oz (136.7 kg)  Height: 5' 7 (1.702 m)  PainSc: 0-No pain   Body mass index is 47.21 kg/m.  Wt Readings from Last 3 Encounters:  07/14/24 (!) 301 lb 6.4 oz (136.7 kg)  06/28/24 (!) 301 lb 6.4 oz (136.7 kg)  04/07/24 291 lb 3.2 oz (132.1 kg)      Objective:  Physical Exam Vitals and nursing note reviewed.  Constitutional:      General: She is not in acute distress.    Appearance: Normal appearance. She is obese.  Cardiovascular:     Rate and Rhythm: Normal rate and regular rhythm.     Pulses: Normal pulses.     Heart sounds: Normal heart sounds. No murmur heard. Pulmonary:     Effort: Pulmonary effort is normal. No respiratory distress.     Breath sounds: Normal breath sounds. No wheezing.  Skin:    General: Skin is warm and dry.     Capillary Refill: Capillary refill takes less than 2 seconds.     Coloration: Skin is not jaundiced.  Neurological:     General: No focal deficit present.     Mental Status: She is alert and oriented to person, place, and time.     Cranial Nerves: No cranial nerve deficit.     Motor: No weakness.      Assessment And Plan:  Essential hypertension Assessment & Plan: Blood pressure is well controlled, continue current medications.    Class 3 severe obesity due to excess calories with body mass index (BMI) of 45.0 to 49.9 in adult, unspecified whether serious comorbidity present  Abnormal glucose Assessment & Plan: Stable.  Encouraged to focus on healthy diet and regular exercise.    Other decreased white blood cell (WBC) count Assessment & Plan: Leukopenia identified on lab work, possibly due to viral infection or ethnicity. No signs of acute illness. HIV testing to be considered. - Repeat CBC to confirm leukopenia. - Check HIV status. - Refer to hematology if CBC remains low and HIV is negative.  Orders: -     CBC -     HIV Antibody (routine testing w rflx)  Encounter  for screening Assessment & Plan: No record of hepatitis B vaccination. Important to confirm immunity due to occupation. - Check hepatitis B titer.  Orders: -     Hepatitis B surface antibody,qualitative  Right hip pain Assessment & Plan: Increased right hip pain consistent with osteoarthritis. MRI ruled out tear. Under orthopedic care, exploring treatment options. - Continue pain management with meloxicam  as needed. - Follow up with orthopedic specialist.      Return for keep same next.  Patient was given opportunity to ask questions. Patient verbalized understanding of the plan and was able to repeat key elements of the plan. All questions were answered to their satisfaction.    LILLETTE Natasha Ada, FNP, have reviewed all documentation for this visit. The documentation on 06/28/2024 for the exam, diagnosis, procedures, and orders are all accurate and complete.   IF YOU HAVE BEEN REFERRED TO A SPECIALIST, IT MAY TAKE 1-2 WEEKS TO SCHEDULE/PROCESS THE REFERRAL. IF YOU HAVE NOT HEARD FROM US /SPECIALIST IN TWO WEEKS, PLEASE GIVE US  A CALL AT [phone] X [phone].

## 2024-06-29 DIAGNOSIS — F3289 Other specified depressive episodes: Secondary | ICD-10-CM | POA: Diagnosis not present

## 2024-06-29 DIAGNOSIS — F1020 Alcohol dependence, uncomplicated: Secondary | ICD-10-CM | POA: Diagnosis not present

## 2024-06-29 LAB — CBC
Hematocrit: 43.1 % (ref 34.0–46.6)
Hemoglobin: 13.6 g/dL (ref 11.1–15.9)
MCH: 29.3 pg (ref 26.6–33.0)
MCHC: 31.6 g/dL (ref 31.5–35.7)
MCV: 93 fL (ref 79–97)
Platelets: 242 x10E3/uL (ref 150–450)
RBC: 4.64 x10E6/uL (ref 3.77–5.28)
RDW: 14.3 % (ref 11.7–15.4)
WBC: 3.5 x10E3/uL (ref 3.4–10.8)

## 2024-06-29 LAB — HIV ANTIBODY (ROUTINE TESTING W REFLEX): HIV Screen 4th Generation wRfx: NONREACTIVE

## 2024-06-29 LAB — HEPATITIS B SURFACE ANTIBODY,QUALITATIVE

## 2024-06-30 ENCOUNTER — Encounter: Payer: Self-pay | Admitting: Nurse Practitioner

## 2024-07-05 ENCOUNTER — Other Ambulatory Visit: Payer: Self-pay | Admitting: Nurse Practitioner

## 2024-07-05 ENCOUNTER — Other Ambulatory Visit (HOSPITAL_BASED_OUTPATIENT_CLINIC_OR_DEPARTMENT_OTHER): Payer: Self-pay

## 2024-07-05 ENCOUNTER — Ambulatory Visit (HOSPITAL_BASED_OUTPATIENT_CLINIC_OR_DEPARTMENT_OTHER)

## 2024-07-05 ENCOUNTER — Ambulatory Visit (HOSPITAL_BASED_OUTPATIENT_CLINIC_OR_DEPARTMENT_OTHER): Admitting: Orthopaedic Surgery

## 2024-07-05 ENCOUNTER — Other Ambulatory Visit (HOSPITAL_COMMUNITY): Payer: Self-pay

## 2024-07-05 ENCOUNTER — Other Ambulatory Visit: Payer: Self-pay

## 2024-07-05 ENCOUNTER — Ambulatory Visit: Payer: Self-pay | Admitting: Nurse Practitioner

## 2024-07-05 DIAGNOSIS — F1020 Alcohol dependence, uncomplicated: Secondary | ICD-10-CM | POA: Diagnosis not present

## 2024-07-05 DIAGNOSIS — M2142 Flat foot [pes planus] (acquired), left foot: Secondary | ICD-10-CM | POA: Diagnosis not present

## 2024-07-05 DIAGNOSIS — M79672 Pain in left foot: Secondary | ICD-10-CM

## 2024-07-05 DIAGNOSIS — M7732 Calcaneal spur, left foot: Secondary | ICD-10-CM | POA: Diagnosis not present

## 2024-07-05 DIAGNOSIS — M19072 Primary osteoarthritis, left ankle and foot: Secondary | ICD-10-CM | POA: Diagnosis not present

## 2024-07-05 DIAGNOSIS — M25572 Pain in left ankle and joints of left foot: Secondary | ICD-10-CM

## 2024-07-05 DIAGNOSIS — F419 Anxiety disorder, unspecified: Secondary | ICD-10-CM

## 2024-07-05 DIAGNOSIS — F3289 Other specified depressive episodes: Secondary | ICD-10-CM | POA: Diagnosis not present

## 2024-07-05 MED ORDER — MELOXICAM 15 MG PO TABS
15.0000 mg | ORAL_TABLET | Freq: Every day | ORAL | 4 refills | Status: AC
Start: 2024-07-05 — End: ?
  Filled 2024-07-05: qty 14, 14d supply, fill #0
  Filled 2024-08-18: qty 14, 14d supply, fill #1

## 2024-07-05 NOTE — Progress Notes (Signed)
 Chief Complaint: Lateral hip pain, right shoulder pain     History of Present Illness:   07/05/2024: Resents today predominantly for her left foot.  She does have a history of ankle fracture for which she required fibular plating.  Overall the left hip is feeling much better but she is still having some right hip symptoms deep in the groin.  She is scheduled to start aquatic therapy soon  Natasha [name] is a 58 y.o. female with a history of bilateral lateral based deep hip pain.  She is experiencing some pain in the groin.  Her predominant pain is with rolling over onto the side.  She is having a hard time getting comfortable neither direction.  She has not previously had injections.  With regard to the right shoulder she is experiencing pain in the posterior border of the scapula.  There is pain with overhead motion again in the posterior shoulder as well as the trapezius    PMH/PSH/Family History/Social History/Meds/Allergies:    Past Medical History:  Diagnosis Date   Colon polyps    Diabetes mellitus without complication (HCC)    pre diabetes   GERD (gastroesophageal reflux disease)    Hypertension    Obesity    Past Surgical History:  Procedure Laterality Date   AUGMENTATION MAMMAPLASTY     GASTRIC BYPASS     Social History   Socioeconomic History   Marital status: Single    Spouse name: Not on file   Number of children: Not on file   Years of education: Not on file   Highest education level: Bachelor's degree (e.g., BA, AB, BS)  Occupational History   Not on file  Tobacco Use   Smoking status: Never   Smokeless tobacco: Never  Vaping Use   Vaping status: Never Used  Substance and Sexual Activity   Alcohol  use: Yes    Comment: weekly   Drug use: Never   Sexual activity: Not on file  Other Topics Concern   Not on file  Social History Narrative   Not on file   Social Drivers of Health   Financial Resource Strain: Low Risk  (02/23/2024)   Overall Financial  Resource Strain (CARDIA)    Difficulty of Paying Living Expenses: Not hard at all  Food Insecurity: No Food Insecurity (02/23/2024)   Hunger Vital Sign    Worried About Running Out of Food in the Last Year: Never true    Ran Out of Food in the Last Year: Never true  Transportation Needs: No Transportation Needs (02/23/2024)   PRAPARE - Administrator, Civil Service (Medical): No    Lack of Transportation (Non-Medical): No  Physical Activity: Inactive (02/23/2024)   Exercise Vital Sign    Days of Exercise per Week: 0 days    Minutes of Exercise per Session: 30 min  Stress: Stress Concern Present (02/23/2024)   [employer] of Occupational Health - Occupational Stress Questionnaire    Feeling of Stress : To some extent  Social Connections: Moderately Isolated (02/23/2024)   Social Connection and Isolation Panel    Frequency of Communication with Friends and Family: More than three times a week    Frequency of Social Gatherings with Friends and Family: Three times a week    Attends Religious Services: Never    Active Member of Clubs or Organizations: No    Attends Banker Meetings: Not on file    Marital Status: Living with partner   Family  History  Problem Relation Age of Onset   Kidney disease Mother    Hypertension Mother    Heart disease Father        Na   Diabetes Father    Colon cancer Maternal Grandmother    No Known Allergies Current Outpatient Medications  Medication Sig Dispense Refill   meloxicam  (MOBIC ) 15 MG tablet Take 1 tablet (15 mg total) by mouth daily. 14 tablet 4   amLODipine  (NORVASC ) 5 MG tablet TAKE 1 TABLET BY MOUTH DAILY. PT. MUST MAKE AN APPOINTMENT IN ORDER TO RECEIVE FUTURE REFILLS 90 tablet 1   Blood Glucose Monitoring Suppl (FREESTYLE LITE) w/Device KIT 1 each by Does not apply route 3 (three) times daily. DX: E11.65 1 kit 1   cyclobenzaprine  (FLEXERIL ) 10 MG tablet Take 1 tablet (10 mg total) by mouth 3 (three) times daily  as needed for muscle spasms. 30 tablet 0   gabapentin  (NEURONTIN ) 300 MG capsule Take 1 capsule (300 mg total) by mouth 3 (three) times daily. 90 capsule 2   gabapentin  (NEURONTIN ) 800 MG tablet Take 1 tablet (800 mg total) by mouth at bedtime. 90 tablet 3   hydrOXYzine  (ATARAX ) 25 MG tablet Take 1 tablet (25 mg total) by mouth 3 (three) times daily as needed. 30 tablet 0   Insulin  Pen Needle (NOVOFINE) 30G X 8 MM MISC USE AS DIRECTED WITH SAXENDA  100 each 2   Lancets (FREESTYLE) lancets Use to check blood sugars 3 times a week DX: E11.65 150 each 3   metFORMIN  (GLUCOPHAGE ) 500 MG tablet Take 1 tablet (500 mg total) by mouth 2 (two) times daily with a meal. 60 tablet 2   Semaglutide -Weight Management (WEGOVY ) 0.5 MG/0.5ML SOAJ Inject 0.5 mg into the skin once a week. (Patient not taking: Reported on 06/28/2024) 2 mL 1   Semaglutide -Weight Management (WEGOVY ) 1 MG/0.5ML SOAJ Inject 1 mg into the skin once a week. (Patient not taking: Reported on 06/28/2024) 2 mL 1   telmisartan -hydrochlorothiazide  (MICARDIS  HCT) 80-25 MG tablet TAKE 1 TABLET BY MOUTH EVERY DAY 90 tablet 3   thiamine (VITAMIN B-1) 100 MG tablet Take 100 mg by mouth daily.     vitamin B-12 (CYANOCOBALAMIN ) 500 MCG tablet Take 500 mcg by mouth daily.     No current facility-administered medications for this visit.   No results found.  Review of Systems:   A ROS was performed including pertinent positives and negatives as documented in the HPI.  Physical Exam :   Constitutional: NAD and appears stated age Neurological: Alert and oriented Psych: Appropriate affect and cooperative There were no vitals taken for this visit.   Comprehensive Musculoskeletal Exam:    Right shoulder with positive scapular dyskinesis and forward elevation and tight pectoral muscles.  Remainder of neurovascular exam is intact.  Bilateral tenderness about the greater trochanteric.  Positive FADIR bilaterally although this is weakly positive.  Remainder  of distal neurosensory exam is intact   Imaging:   Xray (pelvis, 3 views bilateral hips, 3 views right shoulder): Normal    I personally reviewed and interpreted the radiographs.   Assessment and Plan:   58 y.o. female with evidence of right shoulder scapular dyskinesis as well as bilateral gluteus tendinopathy versus mild hip osteoarthritis.  Overall her hips are doing much better at today's visit.  Her left ankle and foot swelling today are consistent with ankle impingement.  Given this I did discuss options including ultrasound-guided injection of the ankle versus an MRI to rule out any  type of underlying posttraumatic chondral injury.  At this time she would like to think about her options and she will shoot us  a message with further consideration  - Return to clinic as needed   I personally saw and evaluated the patient, and participated in the management and treatment plan.  Elspeth Parker, MD Attending Physician, Orthopedic Surgery  This document was dictated using Dragon voice recognition software. A reasonable attempt at proof reading has been made to minimize errors.

## 2024-07-06 ENCOUNTER — Ambulatory Visit

## 2024-07-06 DIAGNOSIS — G8929 Other chronic pain: Secondary | ICD-10-CM

## 2024-07-06 DIAGNOSIS — M542 Cervicalgia: Secondary | ICD-10-CM | POA: Diagnosis not present

## 2024-07-06 DIAGNOSIS — M6281 Muscle weakness (generalized): Secondary | ICD-10-CM | POA: Diagnosis not present

## 2024-07-06 DIAGNOSIS — R252 Cramp and spasm: Secondary | ICD-10-CM

## 2024-07-06 DIAGNOSIS — M25511 Pain in right shoulder: Secondary | ICD-10-CM | POA: Diagnosis not present

## 2024-07-06 DIAGNOSIS — M25551 Pain in right hip: Secondary | ICD-10-CM | POA: Diagnosis not present

## 2024-07-06 DIAGNOSIS — M25552 Pain in left hip: Secondary | ICD-10-CM | POA: Diagnosis not present

## 2024-07-06 NOTE — Therapy (Addendum)
 OUTPATIENT PHYSICAL THERAPY UPPER AND LOWER EXTREMITY TREATMENT   Patient Name: Natasha [name] MRN: [id number] DOB:1966-09-25, 58 y.o., female Today's Date: 07/06/2024  END OF SESSION:  PT End of Session - 07/06/24 1111     Visit Number 9   Date for PT Re-Evaluation 08/31/24   Authorization Type [facility] [employer]    PT Start Time 1110    PT Stop Time 1208    PT Time Calculation (min) 58 min    Activity Tolerance Patient tolerated treatment well    Behavior During Therapy WFL for tasks assessed/performed              Past Medical History:  Diagnosis Date   Colon polyps    Diabetes mellitus without complication (HCC)    pre diabetes   GERD (gastroesophageal reflux disease)    Hypertension    Obesity    Past Surgical History:  Procedure Laterality Date   AUGMENTATION MAMMAPLASTY     GASTRIC BYPASS     Patient Active Problem List   Diagnosis Date Noted   Class 3 severe obesity due to excess calories with serious comorbidity and body mass index (BMI) of 45.0 to 49.9 in adult 03/05/2024   Anxiety 03/05/2024   Other insomnia 03/05/2024   COVID-19 vaccination declined 11/03/2023   Abnormal glucose 12/06/2022   Essential hypertension 12/06/2022   Gastroesophageal reflux disease without esophagitis 12/06/2022   Class 3 drug-induced obesity with serious comorbidity and body mass index (BMI) of 40.0 to 44.9 in adult [facility]) 12/06/2022    PCP: Georgina Speaks, FNP   REFERRING PROVIDER: Genelle Standing, MD   REFERRING DIAG: [phone] (ICD-10-CM) - Chronic right shoulder pain  Scapular dyskinesis program  Anterior chain mobilization  THERAPY DIAG:  Muscle weakness (generalized)  Cramp and spasm  Pain of both hip joints  Chronic right shoulder pain  Cervicalgia  Rationale for Evaluation and Treatment: Rehabilitation  ONSET DATE: many months ago  SUBJECTIVE:                                                                                                                                                                                       SUBJECTIVE STATEMENT: My hip has started feeling better since being on the meloxicam . I really prefer the aquatic therapy because it has been helping a lot. I have noticed some increased Lt ankle swelling recently. My doctor did an xray and said it looks like I have a bone spur.    Hand dominance: Right  PERTINENT HISTORY:  B hip pain, HTN  PAIN:  Are you having pain? None right now  PRECAUTIONS: None  RED FLAGS: None   WEIGHT BEARING  RESTRICTIONS: No  FALLS:  Has patient fallen in last 6 months? No  LIVING ENVIRONMENT: Lives with: lives with their family Lives in: House/apartment Stairs: Yes: Internal: 15 steps; on right going up Has following equipment at home: None  OCCUPATION: Nurse  PLOF: Independent  PATIENT GOALS: Less pain  NEXT MD VISIT: 6 weeks  OBJECTIVE:  Note: Objective measures were completed at Evaluation unless otherwise noted.  DIAGNOSTIC FINDINGS:  Mild A/C OA  PATIENT SURVEYS :  UEFS  Extreme difficulty/unable (0), Quite a bit of difficulty (1), Moderate difficulty (2), Little difficulty (3), No difficulty (4) Survey date:   07/06/24  Any of your usual work, household or school activities 3 3  2. Your usual hobbies, recreational/sport activities 0 1   3. Lifting a bag of groceries to waist level 4 3   4. Lifting a bag of groceries above your head 3 3  5. Grooming your hair 3 4  6. Pushing up on your hands (I.e. from bathtub or chair) 2 1  7. Preparing food (I.e. peeling/cutting) 4 4  8. Driving  3 4  9. Vacuuming, sweeping, or raking 3 3  10. Dressing  3 4  11. Doing up buttons 4 4  12. Using tools/appliances 4 4  13. Opening doors 4 4  14. Cleaning  4 3  15. Tying or lacing shoes 4 4  16. Sleeping  1 3  17. Laundering clothes (I.e. washing, ironing, folding) 4 4  18. Opening a jar 4 4  19. Throwing a ball 4 3  20. Carrying a small suitcase with your affected  limb.  2 3  Score total:  63 66     COGNITION: Overall cognitive status: Within functional limits for tasks assessed     SENSATION: Not tested  POSTURE: Mild rounded shoulders,   CERVICAL ROM: * into R UT  Active ROM A/PROM (deg) eval  Flexion Full *  Extension 25*  Right lateral flexion 19 *  Left lateral flexion 14 relief  Right rotation 52  Left rotation 45   (Blank rows = not tested)   UPPER EXTREMITY ROM: Full B shoulder ROM, Relief with functional IR behind back   UPPER EXTREMITY MMT:  MMT Right eval Left eval Right 07/06/24  Shoulder flexion 4+  5  Shoulder extension 4+  4+  Shoulder abduction 4 mild discomfort  4+  Shoulder adduction     Shoulder internal rotation 5    Shoulder external rotation 5    Middle trapezius 4 5 4+  Lower trapezius 4- 5 4  (Blank rows = not tested)  SHOULDER SPECIAL TESTS: Impingement tests: Neer impingement test: negative and Hawkins/Kennedy impingement test: negative Rotator cuff assessment: Empty can test: positive  and Full can test: negative  scaption weak  CERVICAL SPECIAL TESTS: Neg compression/distraction, negative spurlings B  PALPATION:  Increased tension and TTP of R UT and levator scap. Pain with UPA mobs to lower cervical and upper thoracic spine  Hip Asessment: 06/02/24 Imaging: IMPRESSION: 1. Mild bilateral femoroacetabular osteoarthritis. 2. Left greater than right lumbosacral assimilation joints with associated moderate degenerative changes. This can be a source of lower back and/or hip pain.  PAIN:  Are you having pain? No, not currently  Subjective:  The hips have been hurting for months now starting when I was walking more.   If I sit down for awhile it gets stiff.  It hurts more in the groin on both sides I also have LBP - they definitiely  go together.   Objective:  Lumbar:  AROM  Flexion: hands to floor Extension: WNL and feels good Rot B: WNL Sidebend WNL  Hip AROM: WNL Hip PROM:  painfree, some pull with cross body position MMT: all 4+/5 without pain Palpation: +1-2 ttp to Rt lateral hip at greater trochanter, +1 to left hip Tightness in quads bil, bil lateral hip Lt>Rt Feels good with lumbar extension standing and prone  Posture: increased lumbar lordosis                                                                                                                              TREATMENT DATE:  07/06/24: Self Care Pt mentioned new increased Lt ankle pain and swelling. As a lymphedema therapist, asked pt questions within scope of practice regarding swelling and she reports symptoms of lifelong ttp in thighs, bruises easily and family history (mom and grandmother) of same. Suggested pt speak with her PCP about possibility of her having lipedema. Explained to pt that diagnosis of this has to be from an MD if she does in fact have this and pt verbalized understanding. Also issued info on Juzo Sensation capri for lipedema compresion if she does end up being diagnosed with this by her doctor so she can ask her doctor if this is okay for her. Then verbally reviewed HEP exs with pt and what has been helping. She reports butterfly stretch has been very helpful at decreasing her Rt groin pain along with meloxicam . Also discussed importance of continuing with awareness of erect posture to help promote decreased strain to cervical muscles for improved neck pain and Rt shoulder pain. Pt reports she has adjusted her workspace to allow for improved ergonomics. Discussed importance of head posture (head on headrest as much as able) when driving to also alleviate neck strain.  Therapeutic Exercises Supine with Rt LE hanging off mat table and bending knee with foot on floor until stretch felt at Rt groin, 3 reps, 10 sec holds. Pt reports feeling very good stretch where she was tight and had pain Then Lt S/L for Rt LE extended behind her and towards mat table for ITB stretch, pt reports this  beneficial as well Therapeutic Activities Supine core stabs as follows: Posterior pelvic tilt x 10 with 5 sec holds; VC's to relax upper body then able to return correct demo; next tilt with clams 2 x 5, alt march 2 x 5 and then alt heel slides x 5; bridge x 10; bridge with clam x 5, pt did well with VC's to keep hips elevated and level; trial of bridge with march but pt unable to lift leg so stopped Reverse curl x 10 returning therapist demo   06/28/24 Hips/neck? Check goals    06/21/24: Pt seen for aquatic therapy today.  Treatment took place in water 3.5-4.75 ft in depth at the [facility] pool. Temp of water was 91.  Pt entered/exited the pool via stairs using  step to pattern with hand rail. Pt requires the buoyancy and hydrostatic pressure of water for support, and to offload joints by unweighting joint load by at least 50 % in navel deep water and by at least 75-80% in chest to neck deep water.  Viscosity of the water is needed for resistance of strengthening. Water current perturbations provides challenge to standing balance requiring increased core activation. Seated: ankle and knee AROM while discussing her status. Seated decompression with PTA assist for trunk control 5 min for pain reduction.75% depth for standing hip 3 ways in pain free Rom 10x each Bil.     [facility] Adult PT Treatment:                                                DATE: 06/07/24 Pt seen for aquatic therapy today.  Treatment took place in water 3.5-4.75 ft in depth at the [facility] pool. Temp of water was 91.  Pt entered/exited the pool via stairs using step to pattern with hand rail.  *Intro to setting *walking forward, back and side stepping in 3.6 ft unsupported *Farmers carry using yellow HB forward and back unilaterally then bilaterally. *Hamstring and gastroc stretching at steps *quad stretching at steps *Ue support yellow HB: toe raises; heel raises; hip add/abd; hip flex/extension; relaxed  squats x~10 *straddling noodle ue support corner wall:hip add/abd; hip flex/ext  - ue support yellow  YA:rbropwh across pool.   *Horizontal shoulder add/abd; bow&arrow   Pt requires the buoyancy and hydrostatic pressure of water for support, and to offload joints by unweighting joint load by at least 50 % in navel deep water and by at least 75-80% in chest to neck deep water.  Viscosity of the water is needed for resistance of strengthening. Water current perturbations provides challenge to standing balance requiring increased core activation.     06/02/24 Starting discussing neck/shoulder and then switched to hip evaluation per above for bil hip stiffness and discomfort along with low back stiffness See above Then updated HEP to include hip stretches and low back AROM with all performed with cueing and instruction as needed.  Also discussed aquatic therapy which pt is very interested in so we switched her to 1x land 1x aquatic   PATIENT EDUCATION: Education details: per today's note Person educated: Patient Education method: Explanation, Demonstration, and Handouts Education comprehension: verbalized understanding and returned demonstration  HOME EXERCISE PROGRAM: Access Code: [email or web address] URL: [email or web address] Date: 05/31/2024 Prepared by: Orvil Beuhring  Exercises - Standing Shoulder Horizontal Abduction with Resistance  - 1 x daily - 3 x weekly - 1-3 sets - 10 reps - Standing Shoulder Row with Anchored Resistance  - 1 x daily - 3 x weekly - 1-3 sets - 10 reps - Shoulder extension with resistance - Neutral  - 1 x daily - 3 x weekly - 1-3 sets - 10 reps - Standing Shoulder Single Arm PNF D2 Flexion with Resistance  - 1 x daily - 3 x weekly - 1-3 sets - 10 reps - Shoulder External Rotation and Scapular Retraction with Resistance  - 1 x daily - 7 x weekly - 1-3 sets - 10 reps - Doorway Pec Stretch at 60 Elevation  - 2 x daily - 7 x weekly - 1 sets - 2 reps - 30 sec  hold - Seated Upper Trapezius Stretch  - 3  x daily - 7 x weekly - 1 sets - 1 reps - 30 hold - Seated Levator Scapulae Stretch  - 1 x daily - 7 x weekly - 1 sets - 1 reps - 30 hold - Cervical Retraction with Resistance  - 1 x daily - 7 x weekly - 1 sets - 3 reps - 10 hold  Patient Education - Office Posture  Access Code: [email or web address] URL: [email or web address] Date: 06/02/2024 Prepared by: Saddie Raw  Exercises - Supine Bridge  - 1 x daily - 7 x weekly - 1-3 sets - 10 reps - 20-30 seconds hold - Supine Lower Trunk Rotation  - 1 x daily - 7 x weekly - 1 sets - 10 reps - 5-6 sec hold - Supine Butterfly Groin Stretch  - 1 x daily - 7 x weekly - 1 sets - 2-3 reps - 20-30 seconds hold - Supine Posterior Pelvic Tilt  - 1 x daily - 7 x weekly - 1-3 sets - 10 reps - 20-30 seconds hold - Supine Figure 4 Piriformis Stretch  - 1 x daily - 7 x weekly - 2 sets - 3 reps - 20 seconds hold  ASSESSMENT:  CLINICAL IMPRESSION: Reassessed goals today as pt hasn't been to land PT x 1 month. She has met all STG's and is progressing well towards LTG's. During session today reviewed current HEP and progressed pt to include core stabs and Rt groin flexibility. She has no hip pain today and reports this has been greatly improved since starting meloxicam  and doing new hip stretches. She is bale to transition from sit-stand without pain now. Pt is to discuss with her PCP possibility of lipedema due to similar symptoms. Pt is requesting more aquatic therapy than land at this time as she is reporting great relief from her pain at this time and feels challenged by water resistance. Pt will benefit from continued physical therapy at this time to progress toward unmet goals. POC was extended 2x per week up to 8 weeks.     OBJECTIVE IMPAIRMENTS: decreased activity tolerance, decreased ROM, decreased strength, increased muscle spasms, impaired flexibility, impaired UE functional use, postural dysfunction, obesity,  and pain.   ACTIVITY LIMITATIONS: carrying, lifting, standing, and sleeping  PARTICIPATION LIMITATIONS: meal prep, cleaning, laundry, community activity, occupation, and yard work  PERSONAL FACTORS: Time since onset of injury/illness/exacerbation and 1-2 comorbidities: B hip pain, HTN are also affecting patient's functional outcome.   REHAB POTENTIAL: Excellent  CLINICAL DECISION MAKING: Evolving/moderate complexity  EVALUATION COMPLEXITY: Moderate  GOALS: Goals reviewed with patient? Yes  SHORT TERM GOALS: Target date: 06/02/2024   Patient will be independent with initial HEP.  Baseline:  Goal status: MET 6/25  2.  Improved sleep by 50% secondary to decreased shoulder pain.  Baseline: 07/06/24 - 60% Goal status: MET  3.  Decreased shoulder pain by 30% with ADLs Baseline: 07/06/24 - 60% Goal status: MET   LONG TERM GOALS: Target date: 08/31/2024   Patient will be independent with advanced/ongoing HEP to improve outcomes and carryover.  Baseline:  Goal status: ONGOING  2.  Patient able to carry and lift without increased shoulder pain. Baseline: 07/06/24 - 60% improved Goal status: ONGOING  3.  Patient to demonstrate functional neck ROM without pain provocation.  Baseline: 07/06/24 - pt reports still tight but no more pain Goal status: MET  4.  Patient will demonstrate improved R shoulder and scapular strength to 5/5 in able to lift and carry without difficulty.  Baseline: See above  MMT chart Goal status: ONGOING  5. Increased UEFS score to 72 or better demonstrating improved functional ability.  Baseline: 63; 07/06/24 - 66 Goal status: ONGOING  6. Pt will be ind with final HEP for hip stretching, core mobility, and postural changes for the neck and low pain to maximize impact of posture on pain.  Baseline: No knowledge; 07/06/24 - Pt is independent with initial HEP for neck stretches, today progressed HEP to include cervical stability and hip flexibility along with  initial core strength Goal Status: ONGOING   PLAN: PT FREQUENCY: 1-2x/week  PT DURATION: 8 weeks  PLANNED INTERVENTIONS: 97164- PT Re-evaluation, 97110-Therapeutic exercises, 97530- Therapeutic activity, 97112- Neuromuscular re-education, 97535- Self Care, 02859- Manual therapy, [id number]- Aquatic Therapy, [id number]- Electrical stimulation (unattended), 20560 (1-2 muscles), 20561 (3+ muscles)- Dry Needling, Patient/Family education, Joint mobilization, Spinal mobilization, Cryotherapy, and Moist heat  PLAN FOR NEXT SESSION: Review and progress HEP, cont aquatic visits with general TE, general postural strengthening for neck, back, and bil hip pain, spinal mobs prn   Berwyn Knights, PTA 07/06/24 1:00 PM   [facility] [address] [place], [place], [address] Phone: [phone]   Fax:  [phone]   07/06/24 1:00 PM  Saddie Raw, PT addendum 07/26/24 to extend POC

## 2024-07-07 ENCOUNTER — Ambulatory Visit (HOSPITAL_BASED_OUTPATIENT_CLINIC_OR_DEPARTMENT_OTHER): Attending: Orthopaedic Surgery | Admitting: Physical Therapy

## 2024-07-07 ENCOUNTER — Encounter (HOSPITAL_BASED_OUTPATIENT_CLINIC_OR_DEPARTMENT_OTHER): Payer: Self-pay | Admitting: Physical Therapy

## 2024-07-07 ENCOUNTER — Ambulatory Visit: Admitting: Physical Therapy

## 2024-07-07 DIAGNOSIS — G8929 Other chronic pain: Secondary | ICD-10-CM | POA: Diagnosis not present

## 2024-07-07 DIAGNOSIS — M25551 Pain in right hip: Secondary | ICD-10-CM | POA: Diagnosis not present

## 2024-07-07 DIAGNOSIS — M25511 Pain in right shoulder: Secondary | ICD-10-CM | POA: Diagnosis not present

## 2024-07-07 DIAGNOSIS — M25552 Pain in left hip: Secondary | ICD-10-CM | POA: Insufficient documentation

## 2024-07-07 DIAGNOSIS — R252 Cramp and spasm: Secondary | ICD-10-CM | POA: Insufficient documentation

## 2024-07-07 DIAGNOSIS — M6281 Muscle weakness (generalized): Secondary | ICD-10-CM | POA: Diagnosis not present

## 2024-07-07 NOTE — Therapy (Signed)
 OUTPATIENT PHYSICAL THERAPY UPPER AND LOWER EXTREMITY TREATMENT   Patient Name: Natasha [name] MRN: [id number] DOB:October 08, 1966, 58 y.o., female Today's Date: 07/07/2024  END OF SESSION:  PT End of Session - 07/07/24 1321     Visit Number 7    Date for PT Re-Evaluation 07/21/24    Authorization Type [facility] [employer]    PT Start Time 1317    PT Stop Time 1400    PT Time Calculation (min) 43 min    Activity Tolerance Patient tolerated treatment well    Behavior During Therapy WFL for tasks assessed/performed              Past Medical History:  Diagnosis Date   Colon polyps    Diabetes mellitus without complication (HCC)    pre diabetes   GERD (gastroesophageal reflux disease)    Hypertension    Obesity    Past Surgical History:  Procedure Laterality Date   AUGMENTATION MAMMAPLASTY     GASTRIC BYPASS     Patient Active Problem List   Diagnosis Date Noted   Class 3 severe obesity due to excess calories with serious comorbidity and body mass index (BMI) of 45.0 to 49.9 in adult 03/05/2024   Anxiety 03/05/2024   Other insomnia 03/05/2024   COVID-19 vaccination declined 11/03/2023   Abnormal glucose 12/06/2022   Essential hypertension 12/06/2022   Gastroesophageal reflux disease without esophagitis 12/06/2022   Class 3 drug-induced obesity with serious comorbidity and body mass index (BMI) of 40.0 to 44.9 in adult (HCC) 12/06/2022    PCP: Georgina Speaks, FNP   REFERRING PROVIDER: Genelle Standing, MD   REFERRING DIAG: [phone] (ICD-10-CM) - Chronic right shoulder pain  Scapular dyskinesis program  Anterior chain mobilization  THERAPY DIAG:  Muscle weakness (generalized)  Cramp and spasm  Pain of both hip joints  Rationale for Evaluation and Treatment: Rehabilitation  ONSET DATE: many months ago  SUBJECTIVE:                                                                                                                                                                                       SUBJECTIVE STATEMENT: I have been focusing on my posture.  My hip is ok unless I am walk on unlevel surfaces.   Hand dominance: Right  PERTINENT HISTORY:  B hip pain, HTN  PAIN:  Are you having pain? None right now  PRECAUTIONS: None  RED FLAGS: None   WEIGHT BEARING RESTRICTIONS: No  FALLS:  Has patient fallen in last 6 months? No  LIVING ENVIRONMENT: Lives with: lives with their family Lives in: House/apartment Stairs: Yes: Internal: 15 steps; on right going up Has  following equipment at home: None  OCCUPATION: Nurse  PLOF: Independent  PATIENT GOALS: Less pain  NEXT MD VISIT: 6 weeks  OBJECTIVE:  Note: Objective measures were completed at Evaluation unless otherwise noted.  DIAGNOSTIC FINDINGS:  Mild A/C OA  PATIENT SURVEYS :  UEFS  Extreme difficulty/unable (0), Quite a bit of difficulty (1), Moderate difficulty (2), Little difficulty (3), No difficulty (4) Survey date:   07/06/24  Any of your usual work, household or school activities 3 3  2. Your usual hobbies, recreational/sport activities 0 1   3. Lifting a bag of groceries to waist level 4 3   4. Lifting a bag of groceries above your head 3 3  5. Grooming your hair 3 4  6. Pushing up on your hands (I.e. from bathtub or chair) 2 1  7. Preparing food (I.e. peeling/cutting) 4 4  8. Driving  3 4  9. Vacuuming, sweeping, or raking 3 3  10. Dressing  3 4  11. Doing up buttons 4 4  12. Using tools/appliances 4 4  13. Opening doors 4 4  14. Cleaning  4 3  15. Tying or lacing shoes 4 4  16. Sleeping  1 3  17. Laundering clothes (I.e. washing, ironing, folding) 4 4  18. Opening a jar 4 4  19. Throwing a ball 4 3  20. Carrying a small suitcase with your affected limb.  2 3  Score total:  63 66     COGNITION: Overall cognitive status: Within functional limits for tasks assessed     SENSATION: Not tested  POSTURE: Mild rounded shoulders,   CERVICAL ROM: * into R  UT  Active ROM A/PROM (deg) eval  Flexion Full *  Extension 25*  Right lateral flexion 19 *  Left lateral flexion 14 relief  Right rotation 52  Left rotation 45   (Blank rows = not tested)   UPPER EXTREMITY ROM: Full B shoulder ROM, Relief with functional IR behind back   UPPER EXTREMITY MMT:  MMT Right eval Left eval Right 07/06/24  Shoulder flexion 4+  5  Shoulder extension 4+  4+  Shoulder abduction 4 mild discomfort  4+  Shoulder adduction     Shoulder internal rotation 5    Shoulder external rotation 5    Middle trapezius 4 5 4+  Lower trapezius 4- 5 4  (Blank rows = not tested)  SHOULDER SPECIAL TESTS: Impingement tests: Neer impingement test: negative and Hawkins/Kennedy impingement test: negative Rotator cuff assessment: Empty can test: positive  and Full can test: negative  scaption weak  CERVICAL SPECIAL TESTS: Neg compression/distraction, negative spurlings B  PALPATION:  Increased tension and TTP of R UT and levator scap. Pain with UPA mobs to lower cervical and upper thoracic spine  Hip Asessment: 06/02/24 Imaging: IMPRESSION: 1. Mild bilateral femoroacetabular osteoarthritis. 2. Left greater than right lumbosacral assimilation joints with associated moderate degenerative changes. This can be a source of lower back and/or hip pain.  PAIN:  Are you having pain? No, not currently  Subjective:  The hips have been hurting for months now starting when I was walking more.   If I sit down for awhile it gets stiff.  It hurts more in the groin on both sides I also have LBP - they definitiely go together.   Objective:  Lumbar:  AROM  Flexion: hands to floor Extension: WNL and feels good Rot B: WNL Sidebend WNL  Hip AROM: WNL Hip PROM: painfree, some pull with cross  body position MMT: all 4+/5 without pain Palpation: +1-2 ttp to Rt lateral hip at greater trochanter, +1 to left hip Tightness in quads bil, bil lateral hip Lt>Rt Feels good with  lumbar extension standing and prone  Posture: increased lumbar lordosis                                                                                                                              TREATMENT DATE:  [facility] Adult PT Treatment:                                                DATE: 07/07/24 Pt seen for aquatic therapy today.  Treatment took place in water 3.5-4.75 ft in depth at the [facility] pool. Temp of water was 91.  Pt entered/exited the pool via stairs using step to pattern with hand rail.   *walking forward, back and side stepping in 3.6 ft unsupported *Farmers carry using yellow HB forward and back unilaterally then bilaterally. *quad stretching using noodle *Ue support yellow HB: toe raises; heel raises; hip add/abd; hip flex/extension; relaxed squats x~10 *Horizontal shoulder add/abd; bow&arrow *hip hiking *STS from 3rd step x 10. Cues and demonstration for technique and execution, immediate standing balance   Pt requires the buoyancy and hydrostatic pressure of water for support, and to offload joints by unweighting joint load by at least 50 % in navel deep water and by at least 75-80% in chest to neck deep water.  Viscosity of the water is needed for resistance of strengthening. Water current perturbations provides challenge to standing balance requiring increased core activation.  07/06/24: Self Care Pt mentioned new increased Lt ankle pain and swelling. As a lymphedema therapist, asked pt questions within scope of practice regarding swelling and she reports symptoms of lifelong ttp in thighs, bruises easily and family history (mom and grandmother) of same. Suggested pt speak with her PCP about possibility of her having lipedema. Explained to pt that diagnosis of this has to be from an MD if she does in fact have this and pt verbalized understanding. Also issued info on Juzo Sensation capri for lipedema compresion if she does end up being diagnosed with this by  her doctor so she can ask her doctor if this is okay for her. Then verbally reviewed HEP exs with pt and what has been helping. She reports butterfly stretch has been very helpful at decreasing her Rt groin pain along with meloxicam . Also discussed importance of continuing with awareness of erect posture to help promote decreased strain to cervical muscles for improved neck pain and Rt shoulder pain. Pt reports she has adjusted her workspace to allow for improved ergonomics. Discussed importance of head posture (head on headrest as much as able) when driving to also alleviate neck strain.  Therapeutic Exercises Supine with Rt  LE hanging off mat table and bending knee with foot on floor until stretch felt at Rt groin, 3 reps, 10 sec holds. Pt reports feeling very good stretch where she was tight and had pain Then Lt S/L for Rt LE extended behind her and towards mat table for ITB stretch, pt reports this beneficial as well Therapeutic Activities Supine core stabs as follows: Posterior pelvic tilt x 10 with 5 sec holds; VC's to relax upper body then able to return correct demo; next tilt with clams 2 x 5, alt march 2 x 5 and then alt heel slides x 5; bridge x 10; bridge with clam x 5, pt did well with VC's to keep hips elevated and level; trial of bridge with march but pt unable to lift leg so stopped Reverse curl x 10 returning therapist demo   06/28/24 Hips/neck? Check goals    06/21/24: Pt seen for aquatic therapy today.  Treatment took place in water 3.5-4.75 ft in depth at the [facility] pool. Temp of water was 91.  Pt entered/exited the pool via stairs using step to pattern with hand rail. Pt requires the buoyancy and hydrostatic pressure of water for support, and to offload joints by unweighting joint load by at least 50 % in navel deep water and by at least 75-80% in chest to neck deep water.  Viscosity of the water is needed for resistance of strengthening. Water current perturbations  provides challenge to standing balance requiring increased core activation. Seated: ankle and knee AROM while discussing her status. Seated decompression with PTA assist for trunk control 5 min for pain reduction.75% depth for standing hip 3 ways in pain free Rom 10x each Bil.     [facility] Adult PT Treatment:                                                DATE: 06/07/24 Pt seen for aquatic therapy today.  Treatment took place in water 3.5-4.75 ft in depth at the [facility] pool. Temp of water was 91.  Pt entered/exited the pool via stairs using step to pattern with hand rail.  *Intro to setting *walking forward, back and side stepping in 3.6 ft unsupported *Farmers carry using yellow HB forward and back unilaterally then bilaterally. *Hamstring and gastroc stretching at steps *quad stretching at steps *Ue support yellow HB: toe raises; heel raises; hip add/abd; hip flex/extension; relaxed squats x~10 *straddling noodle ue support corner wall:hip add/abd; hip flex/ext  - ue support yellow  YA:rbropwh across pool.   *Horizontal shoulder add/abd; bow&arrow   Pt requires the buoyancy and hydrostatic pressure of water for support, and to offload joints by unweighting joint load by at least 50 % in navel deep water and by at least 75-80% in chest to neck deep water.  Viscosity of the water is needed for resistance of strengthening. Water current perturbations provides challenge to standing balance requiring increased core activation.     06/02/24 Starting discussing neck/shoulder and then switched to hip evaluation per above for bil hip stiffness and discomfort along with low back stiffness See above Then updated HEP to include hip stretches and low back AROM with all performed with cueing and instruction as needed.  Also discussed aquatic therapy which pt is very interested in so we switched her to 1x land 1x aquatic   PATIENT EDUCATION: Education  details: per today's note Person  educated: Patient Education method: Explanation, Demonstration, and Handouts Education comprehension: verbalized understanding and returned demonstration  HOME EXERCISE PROGRAM: Access Code: [email or web address] URL: [email or web address] Date: 05/31/2024 Prepared by: Orvil Beuhring  Exercises - Standing Shoulder Horizontal Abduction with Resistance  - 1 x daily - 3 x weekly - 1-3 sets - 10 reps - Standing Shoulder Row with Anchored Resistance  - 1 x daily - 3 x weekly - 1-3 sets - 10 reps - Shoulder extension with resistance - Neutral  - 1 x daily - 3 x weekly - 1-3 sets - 10 reps - Standing Shoulder Single Arm PNF D2 Flexion with Resistance  - 1 x daily - 3 x weekly - 1-3 sets - 10 reps - Shoulder External Rotation and Scapular Retraction with Resistance  - 1 x daily - 7 x weekly - 1-3 sets - 10 reps - Doorway Pec Stretch at 60 Elevation  - 2 x daily - 7 x weekly - 1 sets - 2 reps - 30 sec hold - Seated Upper Trapezius Stretch  - 3 x daily - 7 x weekly - 1 sets - 1 reps - 30 hold - Seated Levator Scapulae Stretch  - 1 x daily - 7 x weekly - 1 sets - 1 reps - 30 hold - Cervical Retraction with Resistance  - 1 x daily - 7 x weekly - 1 sets - 3 reps - 10 hold  Patient Education - Office Posture  Access Code: [email or web address] URL: [email or web address] Date: 06/02/2024 Prepared by: Saddie Raw  Exercises - Supine Bridge  - 1 x daily - 7 x weekly - 1-3 sets - 10 reps - 20-30 seconds hold - Supine Lower Trunk Rotation  - 1 x daily - 7 x weekly - 1 sets - 10 reps - 5-6 sec hold - Supine Butterfly Groin Stretch  - 1 x daily - 7 x weekly - 1 sets - 2-3 reps - 20-30 seconds hold - Supine Posterior Pelvic Tilt  - 1 x daily - 7 x weekly - 1-3 sets - 10 reps - 20-30 seconds hold - Supine Figure 4 Piriformis Stretch  - 1 x daily - 7 x weekly - 2 sets - 3 reps - 20 seconds hold  ASSESSMENT:  CLINICAL IMPRESSION:  Progressed core strengthening.  Added focused glute strengthening  with very good toleration. Pt reports feeling right hip engagement post session with some fatigue but no pain.  Pt edu on meloxicam  vs ibuprofen and proper use of pain scale to best discern need for meds/ice pack/rest. Importance of pain management.  VU.  Goals ongoing     OBJECTIVE IMPAIRMENTS: decreased activity tolerance, decreased ROM, decreased strength, increased muscle spasms, impaired flexibility, impaired UE functional use, postural dysfunction, obesity, and pain.   ACTIVITY LIMITATIONS: carrying, lifting, standing, and sleeping  PARTICIPATION LIMITATIONS: meal prep, cleaning, laundry, community activity, occupation, and yard work  PERSONAL FACTORS: Time since onset of injury/illness/exacerbation and 1-2 comorbidities: B hip pain, HTN are also affecting patient's functional outcome.   REHAB POTENTIAL: Excellent  CLINICAL DECISION MAKING: Evolving/moderate complexity  EVALUATION COMPLEXITY: Moderate  GOALS: Goals reviewed with patient? Yes  SHORT TERM GOALS: Target date: 06/02/2024   Patient will be independent with initial HEP.  Baseline:  Goal status: MET 6/25  2.  Improved sleep by 50% secondary to decreased shoulder pain.  Baseline: 07/06/24 - 60% Goal status: MET  3.  Decreased shoulder pain by 30% with ADLs Baseline: 07/06/24 - 60% Goal status:  MET   LONG TERM GOALS: Target date: 07/07/2024   Patient will be independent with advanced/ongoing HEP to improve outcomes and carryover.  Baseline:  Goal status: ONGOING  2.  Patient able to carry and lift without increased shoulder pain. Baseline: 07/06/24 - 60% improved Goal status: ONGOING  3.  Patient to demonstrate functional neck ROM without pain provocation.  Baseline: 07/06/24 - pt reports still tight but no more pain Goal status: MET  4.  Patient will demonstrate improved R shoulder and scapular strength to 5/5 in able to lift and carry without difficulty.  Baseline: See above MMT chart Goal status:  ONGOING  5. Increased UEFS score to 72 or better demonstrating improved functional ability.  Baseline: 63; 07/06/24 - 66 Goal status: ONGOING  6. Pt will be ind with final HEP for hip stretching, core mobility, and postural changes for the neck and low pain to maximize impact of posture on pain.  Baseline: No knowledge; 07/06/24 - Pt is independent with initial HEP for neck stretches, today progressed HEP to include cervical stability and hip flexibility along with initial core strength Goal Status: ONGOING   PLAN: PT FREQUENCY: 1-2x/week  PT DURATION: 8 weeks  PLANNED INTERVENTIONS: 97164- PT Re-evaluation, 97110-Therapeutic exercises, 97530- Therapeutic activity, 97112- Neuromuscular re-education, 97535- Self Care, 02859- Manual therapy, [id number]- Aquatic Therapy, [id number]- Electrical stimulation (unattended), 20560 (1-2 muscles), 20561 (3+ muscles)- Dry Needling, Patient/Family education, Joint mobilization, Spinal mobilization, Cryotherapy, and Moist heat  PLAN FOR NEXT SESSION: Review and progress HEP, cont aquatic visits with general TE, general postural strengthening for neck, back, and bil hip pain, spinal mobs prn   Ronal Kem) Jenet Durio MPT 07/07/24 1:22 PM [facility] [address] [place], [place], [address] Phone: [phone]   Fax:  [phone]

## 2024-07-12 ENCOUNTER — Ambulatory Visit (HOSPITAL_BASED_OUTPATIENT_CLINIC_OR_DEPARTMENT_OTHER): Payer: Self-pay | Admitting: Physical Therapy

## 2024-07-12 ENCOUNTER — Encounter (HOSPITAL_BASED_OUTPATIENT_CLINIC_OR_DEPARTMENT_OTHER): Payer: Self-pay | Admitting: Physical Therapy

## 2024-07-12 ENCOUNTER — Other Ambulatory Visit (HOSPITAL_COMMUNITY): Payer: Self-pay

## 2024-07-12 DIAGNOSIS — M6281 Muscle weakness (generalized): Secondary | ICD-10-CM

## 2024-07-12 DIAGNOSIS — M25551 Pain in right hip: Secondary | ICD-10-CM

## 2024-07-12 DIAGNOSIS — M25552 Pain in left hip: Secondary | ICD-10-CM | POA: Diagnosis not present

## 2024-07-12 DIAGNOSIS — G8929 Other chronic pain: Secondary | ICD-10-CM

## 2024-07-12 DIAGNOSIS — R252 Cramp and spasm: Secondary | ICD-10-CM | POA: Diagnosis not present

## 2024-07-12 DIAGNOSIS — M25511 Pain in right shoulder: Secondary | ICD-10-CM | POA: Diagnosis not present

## 2024-07-12 NOTE — Therapy (Signed)
 OUTPATIENT PHYSICAL THERAPY UPPER AND LOWER EXTREMITY TREATMENT   Patient Name: Natasha [name] MRN: [id number] DOB:[birth year], 58 y.o., female Today's Date: 07/12/2024  END OF SESSION:  PT End of Session - 07/12/24 1450     Visit Number 8    Date for PT Re-Evaluation 07/21/24    Authorization Type [facility] [employer]    PT Start Time 1448    PT Stop Time 1530    PT Time Calculation (min) 42 min    Activity Tolerance Patient tolerated treatment well    Behavior During Therapy WFL for tasks assessed/performed              Past Medical History:  Diagnosis Date   Colon polyps    Diabetes mellitus without complication (HCC)    pre diabetes   GERD (gastroesophageal reflux disease)    Hypertension    Obesity    Past Surgical History:  Procedure Laterality Date   AUGMENTATION MAMMAPLASTY     GASTRIC BYPASS     Patient Active Problem List   Diagnosis Date Noted   Class 3 severe obesity due to excess calories with serious comorbidity and body mass index (BMI) of 45.0 to 49.9 in adult 03/05/2024   Anxiety 03/05/2024   Other insomnia 03/05/2024   COVID-19 vaccination declined 11/03/2023   Abnormal glucose 12/06/2022   Essential hypertension 12/06/2022   Gastroesophageal reflux disease without esophagitis 12/06/2022   Class 3 drug-induced obesity with serious comorbidity and body mass index (BMI) of 40.0 to 44.9 in adult (HCC) 12/06/2022    PCP: Georgina Speaks, FNP   REFERRING PROVIDER: Genelle Standing, MD   REFERRING DIAG: [phone] (ICD-10-CM) - Chronic right shoulder pain  Scapular dyskinesis program  Anterior chain mobilization  THERAPY DIAG:  Muscle weakness (generalized)  Cramp and spasm  Pain of both hip joints  Chronic right shoulder pain  Rationale for Evaluation and Treatment: Rehabilitation  ONSET DATE: many months ago  SUBJECTIVE:                                                                                                                                                                                       SUBJECTIVE STATEMENT: Sharp pains a few time a day when standing from a sitting position 6/10; otherwise 0/10   Hand dominance: Right  PERTINENT HISTORY:  B hip pain, HTN  PAIN:  Are you having pain? None right now  PRECAUTIONS: None  RED FLAGS: None   WEIGHT BEARING RESTRICTIONS: No  FALLS:  Has patient fallen in last 6 months? No  LIVING ENVIRONMENT: Lives with: lives with their family Lives in: House/apartment Stairs: Yes: Internal: 15 steps; on right going  up Has following equipment at home: None  OCCUPATION: Nurse  PLOF: Independent  PATIENT GOALS: Less pain  NEXT MD VISIT: 6 weeks  OBJECTIVE:  Note: Objective measures were completed at Evaluation unless otherwise noted.  DIAGNOSTIC FINDINGS:  Mild A/C OA  PATIENT SURVEYS :  UEFS  Extreme difficulty/unable (0), Quite a bit of difficulty (1), Moderate difficulty (2), Little difficulty (3), No difficulty (4) Survey date:   07/06/24  Any of your usual work, household or school activities 3 3  2. Your usual hobbies, recreational/sport activities 0 1   3. Lifting a bag of groceries to waist level 4 3   4. Lifting a bag of groceries above your head 3 3  5. Grooming your hair 3 4  6. Pushing up on your hands (I.e. from bathtub or chair) 2 1  7. Preparing food (I.e. peeling/cutting) 4 4  8. Driving  3 4  9. Vacuuming, sweeping, or raking 3 3  10. Dressing  3 4  11. Doing up buttons 4 4  12. Using tools/appliances 4 4  13. Opening doors 4 4  14. Cleaning  4 3  15. Tying or lacing shoes 4 4  16. Sleeping  1 3  17. Laundering clothes (I.e. washing, ironing, folding) 4 4  18. Opening a jar 4 4  19. Throwing a ball 4 3  20. Carrying a small suitcase with your affected limb.  2 3  Score total:  63 66     COGNITION: Overall cognitive status: Within functional limits for tasks assessed     SENSATION: Not tested  POSTURE: Mild rounded  shoulders,   CERVICAL ROM: * into R UT  Active ROM A/PROM (deg) eval  Flexion Full *  Extension 25*  Right lateral flexion 19 *  Left lateral flexion 14 relief  Right rotation 52  Left rotation 45   (Blank rows = not tested)   UPPER EXTREMITY ROM: Full B shoulder ROM, Relief with functional IR behind back   UPPER EXTREMITY MMT:  MMT Right eval Left eval Right 07/06/24  Shoulder flexion 4+  5  Shoulder extension 4+  4+  Shoulder abduction 4 mild discomfort  4+  Shoulder adduction     Shoulder internal rotation 5    Shoulder external rotation 5    Middle trapezius 4 5 4+  Lower trapezius 4- 5 4  (Blank rows = not tested)  SHOULDER SPECIAL TESTS: Impingement tests: Neer impingement test: negative and Hawkins/Kennedy impingement test: negative Rotator cuff assessment: Empty can test: positive  and Full can test: negative  scaption weak  CERVICAL SPECIAL TESTS: Neg compression/distraction, negative spurlings B  PALPATION:  Increased tension and TTP of R UT and levator scap. Pain with UPA mobs to lower cervical and upper thoracic spine  Hip Asessment: 06/02/24 Imaging: IMPRESSION: 1. Mild bilateral femoroacetabular osteoarthritis. 2. Left greater than right lumbosacral assimilation joints with associated moderate degenerative changes. This can be a source of lower back and/or hip pain.  PAIN:  Are you having pain? No, not currently  Subjective:  The hips have been hurting for months now starting when I was walking more.   If I sit down for awhile it gets stiff.  It hurts more in the groin on both sides I also have LBP - they definitiely go together.   Objective:  Lumbar:  AROM  Flexion: hands to floor Extension: WNL and feels good Rot B: WNL Sidebend WNL  Hip AROM: WNL Hip PROM: painfree, some pull  with cross body position MMT: all 4+/5 without pain Palpation: +1-2 ttp to Rt lateral hip at greater trochanter, +1 to left hip Tightness in quads bil, bil  lateral hip Lt>Rt Feels good with lumbar extension standing and prone  Posture: increased lumbar lordosis                                                                                                                              TREATMENT DATE:  [facility] Adult PT Treatment:                                                DATE: 07/12/24 Pt seen for aquatic therapy today.  Treatment took place in water 3.5-4.75 ft in depth at the [facility] pool. Temp of water was 91.  Pt entered/exited the pool via stairs using step to pattern with hand rail.   *walking forward, back and side stepping in 3.6 ft unsupported *Farmers carry using yellow HB forward and back unilaterally then bilaterally. *Horizontal shoulder add/abd; bow&arrow *shoulder depression/shrugging using yellow HB 2 x 10 *Ue support yellow HB: toe raises; heel raises; hip add/abd crossing midline; hip flex/extension; relaxed squats x~10 *row using rider band elbow straight then bent 2 x 10 *forward and backward, side stepping amb with arm swing, shoulder add/abd *STS from 3rd step x 10. Cues and demonstration for technique and execution, immediate standing balance   Pt requires the buoyancy and hydrostatic pressure of water for support, and to offload joints by unweighting joint load by at least 50 % in navel deep water and by at least 75-80% in chest to neck deep water.  Viscosity of the water is needed for resistance of strengthening. Water current perturbations provides challenge to standing balance requiring increased core activation.  07/06/24: Self Care Pt mentioned new increased Lt ankle pain and swelling. As a lymphedema therapist, asked pt questions within scope of practice regarding swelling and she reports symptoms of lifelong ttp in thighs, bruises easily and family history (mom and grandmother) of same. Suggested pt speak with her PCP about possibility of her having lipedema. Explained to pt that diagnosis of this has  to be from an MD if she does in fact have this and pt verbalized understanding. Also issued info on Juzo Sensation capri for lipedema compresion if she does end up being diagnosed with this by her doctor so she can ask her doctor if this is okay for her. Then verbally reviewed HEP exs with pt and what has been helping. She reports butterfly stretch has been very helpful at decreasing her Rt groin pain along with meloxicam . Also discussed importance of continuing with awareness of erect posture to help promote decreased strain to cervical muscles for improved neck pain and Rt shoulder pain. Pt reports she has adjusted her workspace to allow  for improved ergonomics. Discussed importance of head posture (head on headrest as much as able) when driving to also alleviate neck strain.  Therapeutic Exercises Supine with Rt LE hanging off mat table and bending knee with foot on floor until stretch felt at Rt groin, 3 reps, 10 sec holds. Pt reports feeling very good stretch where she was tight and had pain Then Lt S/L for Rt LE extended behind her and towards mat table for ITB stretch, pt reports this beneficial as well Therapeutic Activities Supine core stabs as follows: Posterior pelvic tilt x 10 with 5 sec holds; VC's to relax upper body then able to return correct demo; next tilt with clams 2 x 5, alt march 2 x 5 and then alt heel slides x 5; bridge x 10; bridge with clam x 5, pt did well with VC's to keep hips elevated and level; trial of bridge with march but pt unable to lift leg so stopped Reverse curl x 10 returning therapist demo   06/28/24 Hips/neck? Check goals    06/21/24: Pt seen for aquatic therapy today.  Treatment took place in water 3.5-4.75 ft in depth at the [facility] pool. Temp of water was 91.  Pt entered/exited the pool via stairs using step to pattern with hand rail. Pt requires the buoyancy and hydrostatic pressure of water for support, and to offload joints by unweighting  joint load by at least 50 % in navel deep water and by at least 75-80% in chest to neck deep water.  Viscosity of the water is needed for resistance of strengthening. Water current perturbations provides challenge to standing balance requiring increased core activation. Seated: ankle and knee AROM while discussing her status. Seated decompression with PTA assist for trunk control 5 min for pain reduction.75% depth for standing hip 3 ways in pain free Rom 10x each Bil.     [facility] Adult PT Treatment:                                                DATE: 06/07/24 Pt seen for aquatic therapy today.  Treatment took place in water 3.5-4.75 ft in depth at the [facility] pool. Temp of water was 91.  Pt entered/exited the pool via stairs using step to pattern with hand rail.  *Intro to setting *walking forward, back and side stepping in 3.6 ft unsupported *Farmers carry using yellow HB forward and back unilaterally then bilaterally. *Hamstring and gastroc stretching at steps *quad stretching at steps *Ue support yellow HB: toe raises; heel raises; hip add/abd; hip flex/extension; relaxed squats x~10 *straddling noodle ue support corner wall:hip add/abd; hip flex/ext  - ue support yellow  YA:rbropwh across pool.   *Horizontal shoulder add/abd; bow&arrow   Pt requires the buoyancy and hydrostatic pressure of water for support, and to offload joints by unweighting joint load by at least 50 % in navel deep water and by at least 75-80% in chest to neck deep water.  Viscosity of the water is needed for resistance of strengthening. Water current perturbations provides challenge to standing balance requiring increased core activation.     06/02/24 Starting discussing neck/shoulder and then switched to hip evaluation per above for bil hip stiffness and discomfort along with low back stiffness See above Then updated HEP to include hip stretches and low back AROM with all performed with cueing and  instruction as needed.  Also discussed aquatic therapy which pt is very interested in so we switched her to 1x land 1x aquatic   PATIENT EDUCATION: Education details: per today's note Person educated: Patient Education method: Explanation, Demonstration, and Handouts Education comprehension: verbalized understanding and returned demonstration  HOME EXERCISE PROGRAM: Access Code: [email or web address] URL: [email or web address] Date: 05/31/2024 Prepared by: Orvil Beuhring  Exercises - Standing Shoulder Horizontal Abduction with Resistance  - 1 x daily - 3 x weekly - 1-3 sets - 10 reps - Standing Shoulder Row with Anchored Resistance  - 1 x daily - 3 x weekly - 1-3 sets - 10 reps - Shoulder extension with resistance - Neutral  - 1 x daily - 3 x weekly - 1-3 sets - 10 reps - Standing Shoulder Single Arm PNF D2 Flexion with Resistance  - 1 x daily - 3 x weekly - 1-3 sets - 10 reps - Shoulder External Rotation and Scapular Retraction with Resistance  - 1 x daily - 7 x weekly - 1-3 sets - 10 reps - Doorway Pec Stretch at 60 Elevation  - 2 x daily - 7 x weekly - 1 sets - 2 reps - 30 sec hold - Seated Upper Trapezius Stretch  - 3 x daily - 7 x weekly - 1 sets - 1 reps - 30 hold - Seated Levator Scapulae Stretch  - 1 x daily - 7 x weekly - 1 sets - 1 reps - 30 hold - Cervical Retraction with Resistance  - 1 x daily - 7 x weekly - 1 sets - 3 reps - 10 hold  Patient Education - Office Posture  Access Code: [email or web address] URL: [email or web address] Date: 06/02/2024 Prepared by: Saddie Raw  Exercises - Supine Bridge  - 1 x daily - 7 x weekly - 1-3 sets - 10 reps - 20-30 seconds hold - Supine Lower Trunk Rotation  - 1 x daily - 7 x weekly - 1 sets - 10 reps - 5-6 sec hold - Supine Butterfly Groin Stretch  - 1 x daily - 7 x weekly - 1 sets - 2-3 reps - 20-30 seconds hold - Supine Posterior Pelvic Tilt  - 1 x daily - 7 x weekly - 1-3 sets - 10 reps - 20-30 seconds hold - Supine  Figure 4 Piriformis Stretch  - 1 x daily - 7 x weekly - 2 sets - 3 reps - 20 seconds hold  ASSESSMENT:  CLINICAL IMPRESSION:  Progressed core strengthening. Focused on shoulder strengthening with good toleration.  Vcing provided for positioning an posture with completion of ue exercises. Improved immediate standing balance and engagement of glutes with STS transfers today    OBJECTIVE IMPAIRMENTS: decreased activity tolerance, decreased ROM, decreased strength, increased muscle spasms, impaired flexibility, impaired UE functional use, postural dysfunction, obesity, and pain.   ACTIVITY LIMITATIONS: carrying, lifting, standing, and sleeping  PARTICIPATION LIMITATIONS: meal prep, cleaning, laundry, community activity, occupation, and yard work  PERSONAL FACTORS: Time since onset of injury/illness/exacerbation and 1-2 comorbidities: B hip pain, HTN are also affecting patient's functional outcome.   REHAB POTENTIAL: Excellent  CLINICAL DECISION MAKING: Evolving/moderate complexity  EVALUATION COMPLEXITY: Moderate  GOALS: Goals reviewed with patient? Yes  SHORT TERM GOALS: Target date: 06/02/2024   Patient will be independent with initial HEP.  Baseline:  Goal status: MET 6/25  2.  Improved sleep by 50% secondary to decreased shoulder pain.  Baseline: 07/06/24 - 60% Goal status: MET  3.  Decreased shoulder pain by 30% with ADLs Baseline:  07/06/24 - 60% Goal status: MET   LONG TERM GOALS: Target date: 07/07/2024   Patient will be independent with advanced/ongoing HEP to improve outcomes and carryover.  Baseline:  Goal status: ONGOING  2.  Patient able to carry and lift without increased shoulder pain. Baseline: 07/06/24 - 60% improved Goal status: ONGOING  3.  Patient to demonstrate functional neck ROM without pain provocation.  Baseline: 07/06/24 - pt reports still tight but no more pain Goal status: MET  4.  Patient will demonstrate improved R shoulder and scapular  strength to 5/5 in able to lift and carry without difficulty.  Baseline: See above MMT chart Goal status: ONGOING  5. Increased UEFS score to 72 or better demonstrating improved functional ability.  Baseline: 63; 07/06/24 - 66 Goal status: ONGOING  6. Pt will be ind with final HEP for hip stretching, core mobility, and postural changes for the neck and low pain to maximize impact of posture on pain.  Baseline: No knowledge; 07/06/24 - Pt is independent with initial HEP for neck stretches, today progressed HEP to include cervical stability and hip flexibility along with initial core strength Goal Status: ONGOING   PLAN: PT FREQUENCY: 1-2x/week  PT DURATION: 8 weeks  PLANNED INTERVENTIONS: 97164- PT Re-evaluation, 97110-Therapeutic exercises, 97530- Therapeutic activity, 97112- Neuromuscular re-education, 97535- Self Care, 02859- Manual therapy, [id number]- Aquatic Therapy, H9716- Electrical stimulation (unattended), 20560 (1-2 muscles), 20561 (3+ muscles)- Dry Needling, Patient/Family education, Joint mobilization, Spinal mobilization, Cryotherapy, and Moist heat  PLAN FOR NEXT SESSION: Review and progress HEP, cont aquatic visits with general TE, general postural strengthening for neck, back, and bil hip pain, spinal mobs prn   Ronal Kem) Evrett Hakim MPT 07/12/24 3:16 PM [facility] [address] [place], [place], [address] Phone: [phone]   Fax:  [phone]

## 2024-07-13 DIAGNOSIS — F3289 Other specified depressive episodes: Secondary | ICD-10-CM | POA: Diagnosis not present

## 2024-07-13 DIAGNOSIS — F1020 Alcohol dependence, uncomplicated: Secondary | ICD-10-CM | POA: Diagnosis not present

## 2024-07-14 ENCOUNTER — Encounter: Payer: Self-pay | Admitting: Podiatry

## 2024-07-14 ENCOUNTER — Other Ambulatory Visit: Payer: Self-pay | Admitting: Nurse Practitioner

## 2024-07-14 ENCOUNTER — Ambulatory Visit: Admitting: Podiatry

## 2024-07-14 ENCOUNTER — Ambulatory Visit

## 2024-07-14 VITALS — Ht 67.0 in | Wt 301.4 lb

## 2024-07-14 DIAGNOSIS — M19072 Primary osteoarthritis, left ankle and foot: Secondary | ICD-10-CM

## 2024-07-14 DIAGNOSIS — M7752 Other enthesopathy of left foot: Secondary | ICD-10-CM | POA: Diagnosis not present

## 2024-07-14 DIAGNOSIS — F419 Anxiety disorder, unspecified: Secondary | ICD-10-CM

## 2024-07-14 MED ORDER — BETAMETHASONE SOD PHOS & ACET 6 (3-3) MG/ML IJ SUSP
3.0000 mg | Freq: Once | INTRAMUSCULAR | Status: AC
  Administered 2024-07-14: 3 mg via INTRA_ARTICULAR

## 2024-07-14 NOTE — Progress Notes (Signed)
   Chief Complaint  Patient presents with   Foot Pain    Pt is here due to left foot pain, states the pain and swelling has been there for 2-3 weeks, foot is tender to touch , no new injury broke ankle years back, has been soaking and ices foot no relief. X-rays were done on 7/30    HPI: 58 y.o. female presenting today for evaluation of left ankle pain.  History of ORIF to the bilateral ankles.  Most recent LT ankle fracture about 5 years ago in [place] .   Patient states that about 3 weeks ago she developed increased pain with swelling to the left foot and ankle.  Idiopathic.  She did purchase a new pair of shoes which may have elicited the pain  Past Medical History:  Diagnosis Date   Colon polyps    Diabetes mellitus without complication (HCC)    pre diabetes   GERD (gastroesophageal reflux disease)    Hypertension    Obesity     Past Surgical History:  Procedure Laterality Date   AUGMENTATION MAMMAPLASTY     GASTRIC BYPASS      No Known Allergies   Physical Exam: General: The patient is alert and oriented x3 in no acute distress.  Dermatology: Skin is warm, dry and supple bilateral lower extremities.   Vascular: Palpable pedal pulses bilaterally. Capillary refill within normal limits.  No appreciable edema.  No erythema.  Neurological: Grossly intact via light touch  Musculoskeletal Exam: No pedal deformities noted  DG Foot Complete Left 07/05/2024 FINDINGS: No acute fracture or dislocation. Mild arthritis in the IP joints. Calcaneal spurs. Pes planus.Plate and screw fixation of the distal fibula. IMPRESSION: No acute fracture or dislocation. Pes planus.  Radiographic exam LT ankle 07/14/2024 Joint space narrowing with degenerative changes noted to the tibiotalar joint left ankle.  Fibular plate and screws noted to the left ankle without complication.  No fractures identified.  Assessment/Plan of Care: 1. H/o ORIF LT ankle ~ 2021 in [place]  2.  Posttraumatic  arthritis left ankle and midtarsal joints  -Patient evaluated.  X-rays reviewed -Today we discussed pathology of posttraumatic arthritis onset.  For now recommend conservative treatments including cortisone injections, NSAIDs PRN, and good supportive tennis shoes -Injection of 0.5 cc Celestone  Soluspan injected into the medial anterior aspect of the left ankle -Patient has an active prescription for meloxicam  15 mg daily.  Continue PRN -Compression ankle sleeve dispensed wear daily -Recommend good supportive tennis shoes and sneakers.  Refrain from going barefoot -Return to clinic PRN  *Moved from [place] around 2020.       Thresa EMERSON Sar, DPM [facility]  Dr. Thresa EMERSON Sar, DPM    [address] [place], [place] [address]                Office [phone]  Fax [phone]

## 2024-07-15 ENCOUNTER — Ambulatory Visit (HOSPITAL_BASED_OUTPATIENT_CLINIC_OR_DEPARTMENT_OTHER): Admitting: Physical Therapy

## 2024-07-16 DIAGNOSIS — M25551 Pain in right hip: Secondary | ICD-10-CM | POA: Insufficient documentation

## 2024-07-16 DIAGNOSIS — Z139 Encounter for screening, unspecified: Secondary | ICD-10-CM | POA: Insufficient documentation

## 2024-07-16 DIAGNOSIS — D72819 Decreased white blood cell count, unspecified: Secondary | ICD-10-CM | POA: Insufficient documentation

## 2024-07-16 DIAGNOSIS — E669 Obesity, unspecified: Secondary | ICD-10-CM | POA: Insufficient documentation

## 2024-07-16 NOTE — Assessment & Plan Note (Signed)
 Increased right hip pain consistent with osteoarthritis. MRI ruled out tear. Under orthopedic care, exploring treatment options. - Continue pain management with meloxicam  as needed. - Follow up with orthopedic specialist.

## 2024-07-16 NOTE — Assessment & Plan Note (Signed)
 Blood pressure is well controlled, continue current medications.

## 2024-07-16 NOTE — Assessment & Plan Note (Signed)
 No record of hepatitis B vaccination. Important to confirm immunity due to occupation. - Check hepatitis B titer.

## 2024-07-16 NOTE — Assessment & Plan Note (Signed)
 Stable. Encouraged to focus on healthy diet and regular exercise.

## 2024-07-16 NOTE — Assessment & Plan Note (Signed)
 Leukopenia identified on lab work, possibly due to viral infection or ethnicity. No signs of acute illness. HIV testing to be considered. - Repeat CBC to confirm leukopenia. - Check HIV status. - Refer to hematology if CBC remains low and HIV is negative.

## 2024-07-19 ENCOUNTER — Ambulatory Visit: Payer: Self-pay | Attending: Orthopaedic Surgery | Admitting: Physical Therapy

## 2024-07-19 ENCOUNTER — Encounter: Payer: Self-pay | Admitting: Physical Therapy

## 2024-07-19 DIAGNOSIS — M6281 Muscle weakness (generalized): Secondary | ICD-10-CM | POA: Insufficient documentation

## 2024-07-19 DIAGNOSIS — M25552 Pain in left hip: Secondary | ICD-10-CM | POA: Insufficient documentation

## 2024-07-19 DIAGNOSIS — M25511 Pain in right shoulder: Secondary | ICD-10-CM | POA: Diagnosis not present

## 2024-07-19 DIAGNOSIS — G8929 Other chronic pain: Secondary | ICD-10-CM | POA: Diagnosis not present

## 2024-07-19 DIAGNOSIS — M25551 Pain in right hip: Secondary | ICD-10-CM | POA: Insufficient documentation

## 2024-07-19 DIAGNOSIS — R252 Cramp and spasm: Secondary | ICD-10-CM | POA: Diagnosis not present

## 2024-07-19 NOTE — Therapy (Signed)
 OUTPATIENT PHYSICAL THERAPY UPPER AND LOWER EXTREMITY TREATMENT   Patient Name: Natasha [name] MRN: [id number] DOB:12-Aug-1966, 58 y.o., female Today's Date: 07/19/2024  END OF SESSION:  PT End of Session - 07/19/24 1052     Visit Number 9    Date for PT Re-Evaluation 07/21/24    Authorization Type [facility] [employer]    PT Start Time 1050    PT Stop Time 1130    PT Time Calculation (min) 40 min    Activity Tolerance Patient tolerated treatment well    Behavior During Therapy WFL for tasks assessed/performed               Past Medical History:  Diagnosis Date   Colon polyps    Diabetes mellitus without complication (HCC)    pre diabetes   GERD (gastroesophageal reflux disease)    Hypertension    Obesity    Past Surgical History:  Procedure Laterality Date   AUGMENTATION MAMMAPLASTY     GASTRIC BYPASS     Patient Active Problem List   Diagnosis Date Noted   Leucopenia 07/16/2024   Obesity, unspecified 07/16/2024   Right hip pain 07/16/2024   Encounter for screening 07/16/2024   Class 3 severe obesity due to excess calories with serious comorbidity and body mass index (BMI) of 45.0 to 49.9 in adult 03/05/2024   Anxiety 03/05/2024   Other insomnia 03/05/2024   COVID-19 vaccination declined 11/03/2023   Abnormal glucose 12/06/2022   Essential hypertension 12/06/2022   Gastroesophageal reflux disease without esophagitis 12/06/2022   Class 3 drug-induced obesity with serious comorbidity and body mass index (BMI) of 40.0 to 44.9 in adult (HCC) 12/06/2022    PCP: Georgina Speaks, FNP   REFERRING PROVIDER: Genelle Standing, MD   REFERRING DIAG: [phone] (ICD-10-CM) - Chronic right shoulder pain  Scapular dyskinesis program  Anterior chain mobilization  THERAPY DIAG:  Muscle weakness (generalized)  Cramp and spasm  Pain of both hip joints  Chronic right shoulder pain  Rationale for Evaluation and Treatment: Rehabilitation  ONSET DATE: many months  ago  SUBJECTIVE:                                                                                                                                                                                      SUBJECTIVE STATEMENT:Got injection in Lt foot, better. No current pain. I did have groin pain after my last pool session.   Hand dominance: Right  PERTINENT HISTORY:  B hip pain, HTN  PAIN:  Are you having pain? None right now  PRECAUTIONS: None  RED FLAGS: None   WEIGHT BEARING RESTRICTIONS: No  FALLS:  Has patient fallen in  last 6 months? No  LIVING ENVIRONMENT: Lives with: lives with their family Lives in: House/apartment Stairs: Yes: Internal: 15 steps; on right going up Has following equipment at home: None  OCCUPATION: Nurse  PLOF: Independent  PATIENT GOALS: Less pain  NEXT MD VISIT: 6 weeks  OBJECTIVE:  Note: Objective measures were completed at Evaluation unless otherwise noted.  DIAGNOSTIC FINDINGS:  Mild A/C OA  PATIENT SURVEYS :  UEFS  Extreme difficulty/unable (0), Quite a bit of difficulty (1), Moderate difficulty (2), Little difficulty (3), No difficulty (4) Survey date:   07/06/24  Any of your usual work, household or school activities 3 3  2. Your usual hobbies, recreational/sport activities 0 1   3. Lifting a bag of groceries to waist level 4 3   4. Lifting a bag of groceries above your head 3 3  5. Grooming your hair 3 4  6. Pushing up on your hands (I.e. from bathtub or chair) 2 1  7. Preparing food (I.e. peeling/cutting) 4 4  8. Driving  3 4  9. Vacuuming, sweeping, or raking 3 3  10. Dressing  3 4  11. Doing up buttons 4 4  12. Using tools/appliances 4 4  13. Opening doors 4 4  14. Cleaning  4 3  15. Tying or lacing shoes 4 4  16. Sleeping  1 3  17. Laundering clothes (I.e. washing, ironing, folding) 4 4  18. Opening a jar 4 4  19. Throwing a ball 4 3  20. Carrying a small suitcase with your affected limb.  2 3  Score total:  63 66      COGNITION: Overall cognitive status: Within functional limits for tasks assessed     SENSATION: Not tested  POSTURE: Mild rounded shoulders,   CERVICAL ROM: * into R UT  Active ROM A/PROM (deg) eval  Flexion Full *  Extension 25*  Right lateral flexion 19 *  Left lateral flexion 14 relief  Right rotation 52  Left rotation 45   (Blank rows = not tested)   UPPER EXTREMITY ROM: Full B shoulder ROM, Relief with functional IR behind back   UPPER EXTREMITY MMT:  MMT Right eval Left eval Right 07/06/24  Shoulder flexion 4+  5  Shoulder extension 4+  4+  Shoulder abduction 4 mild discomfort  4+  Shoulder adduction     Shoulder internal rotation 5    Shoulder external rotation 5    Middle trapezius 4 5 4+  Lower trapezius 4- 5 4  (Blank rows = not tested)  SHOULDER SPECIAL TESTS: Impingement tests: Neer impingement test: negative and Hawkins/Kennedy impingement test: negative Rotator cuff assessment: Empty can test: positive  and Full can test: negative  scaption weak  CERVICAL SPECIAL TESTS: Neg compression/distraction, negative spurlings B  PALPATION:  Increased tension and TTP of R UT and levator scap. Pain with UPA mobs to lower cervical and upper thoracic spine  Hip Asessment: 06/02/24 Imaging: IMPRESSION: 1. Mild bilateral femoroacetabular osteoarthritis. 2. Left greater than right lumbosacral assimilation joints with associated moderate degenerative changes. This can be a source of lower back and/or hip pain.  PAIN:  Are you having pain? No, not currently  Subjective:  The hips have been hurting for months now starting when I was walking more.   If I sit down for awhile it gets stiff.  It hurts more in the groin on both sides I also have LBP - they definitiely go together.   Objective:  Lumbar:  AROM  Flexion: hands to floor Extension: WNL and feels good Rot B: WNL Sidebend WNL  Hip AROM: WNL Hip PROM: painfree, some pull with cross body  position MMT: all 4+/5 without pain Palpation: +1-2 ttp to Rt lateral hip at greater trochanter, +1 to left hip Tightness in quads bil, bil lateral hip Lt>Rt Feels good with lumbar extension standing and prone  Posture: increased lumbar lordosis                                                                                                                              TREATMENT DATE:  [facility] Adult PT Treatment:                                                DATE:   07/19/24:Pt seen for aquatic therapy today.  Treatment took place in water 3.5-4.75 ft in depth at the [facility] pool. Temp of water was 91.  Pt entered/exited the pool via stairs using step to pattern with hand rail. *walking forward, back and side stepping in 3.6 ft unsupported 10x each *Farmers carry using yellow HB forward and back unilaterally then bilaterally. 4 lengths each position. -hip kicks 20x Bil 3 directions -pink bells forward/back 20x -Sit to stand from second step 2x10   07/12/24 Pt seen for aquatic therapy today.  Treatment took place in water 3.5-4.75 ft in depth at the [facility] pool. Temp of water was 91.  Pt entered/exited the pool via stairs using step to pattern with hand rail.   *walking forward, back and side stepping in 3.6 ft unsupported *Farmers carry using yellow HB forward and back unilaterally then bilaterally. *Horizontal shoulder add/abd; bow&arrow *shoulder depression/shrugging using yellow HB 2 x 10 *Ue support yellow HB: toe raises; heel raises; hip add/abd crossing midline; hip flex/extension; relaxed squats x~10 *row using rider band elbow straight then bent 2 x 10 *forward and backward, side stepping amb with arm swing, shoulder add/abd *STS from 3rd step x 10. Cues and demonstration for technique and execution, immediate standing balance   Pt requires the buoyancy and hydrostatic pressure of water for support, and to offload joints by unweighting joint load by at  least 50 % in navel deep water and by at least 75-80% in chest to neck deep water.  Viscosity of the water is needed for resistance of strengthening. Water current perturbations provides challenge to standing balance requiring increased core activation.     PATIENT EDUCATION: Education details: per today's note Person educated: Patient Education method: Explanation, Demonstration, and Handouts Education comprehension: verbalized understanding and returned demonstration  HOME EXERCISE PROGRAM: Access Code: [email or web address] URL: [email or web address] Date: 05/31/2024 Prepared by: Orvil Fester  Exercises - Standing Shoulder Horizontal Abduction with Resistance  - 1 x daily - 3 x weekly - 1-3 sets - 10 reps - Standing  Shoulder Row with Anchored Resistance  - 1 x daily - 3 x weekly - 1-3 sets - 10 reps - Shoulder extension with resistance - Neutral  - 1 x daily - 3 x weekly - 1-3 sets - 10 reps - Standing Shoulder Single Arm PNF D2 Flexion with Resistance  - 1 x daily - 3 x weekly - 1-3 sets - 10 reps - Shoulder External Rotation and Scapular Retraction with Resistance  - 1 x daily - 7 x weekly - 1-3 sets - 10 reps - Doorway Pec Stretch at 60 Elevation  - 2 x daily - 7 x weekly - 1 sets - 2 reps - 30 sec hold - Seated Upper Trapezius Stretch  - 3 x daily - 7 x weekly - 1 sets - 1 reps - 30 hold - Seated Levator Scapulae Stretch  - 1 x daily - 7 x weekly - 1 sets - 1 reps - 30 hold - Cervical Retraction with Resistance  - 1 x daily - 7 x weekly - 1 sets - 3 reps - 10 hold  Patient Education - Office Posture  Access Code: [email or web address] URL: [email or web address] Date: 06/02/2024 Prepared by: Saddie Raw  Exercises - Supine Bridge  - 1 x daily - 7 x weekly - 1-3 sets - 10 reps - 20-30 seconds hold - Supine Lower Trunk Rotation  - 1 x daily - 7 x weekly - 1 sets - 10 reps - 5-6 sec hold - Supine Butterfly Groin Stretch  - 1 x daily - 7 x weekly - 1 sets - 2-3 reps -  20-30 seconds hold - Supine Posterior Pelvic Tilt  - 1 x daily - 7 x weekly - 1-3 sets - 10 reps - 20-30 seconds hold - Supine Figure 4 Piriformis Stretch  - 1 x daily - 7 x weekly - 2 sets - 3 reps - 20 seconds hold  ASSESSMENT:  CLINICAL IMPRESSION: Good day for [place] in the water, no pain coming into PT nor any pain during. Foot much better post injection for arthritic flare. She is contemplating how she can add aquatic exercise into her life when PT is complete but is not sure just yet.    OBJECTIVE IMPAIRMENTS: decreased activity tolerance, decreased ROM, decreased strength, increased muscle spasms, impaired flexibility, impaired UE functional use, postural dysfunction, obesity, and pain.   ACTIVITY LIMITATIONS: carrying, lifting, standing, and sleeping  PARTICIPATION LIMITATIONS: meal prep, cleaning, laundry, community activity, occupation, and yard work  PERSONAL FACTORS: Time since onset of injury/illness/exacerbation and 1-2 comorbidities: B hip pain, HTN are also affecting patient's functional outcome.   REHAB POTENTIAL: Excellent  CLINICAL DECISION MAKING: Evolving/moderate complexity  EVALUATION COMPLEXITY: Moderate  GOALS: Goals reviewed with patient? Yes  SHORT TERM GOALS: Target date: 06/02/2024   Patient will be independent with initial HEP.  Baseline:  Goal status: MET 6/25  2.  Improved sleep by 50% secondary to decreased shoulder pain.  Baseline: 07/06/24 - 60% Goal status: MET  3.  Decreased shoulder pain by 30% with ADLs Baseline: 07/06/24 - 60% Goal status: MET   LONG TERM GOALS: Target date: 07/07/2024   Patient will be independent with advanced/ongoing HEP to improve outcomes and carryover.  Baseline:  Goal status: ONGOING  2.  Patient able to carry and lift without increased shoulder pain. Baseline: 07/06/24 - 60% improved Goal status: ONGOING  3.  Patient to demonstrate functional neck ROM without pain provocation.  Baseline: 07/06/24 - pt reports  still tight but no  more pain Goal status: MET  4.  Patient will demonstrate improved R shoulder and scapular strength to 5/5 in able to lift and carry without difficulty.  Baseline: See above MMT chart Goal status: ONGOING  5. Increased UEFS score to 72 or better demonstrating improved functional ability.  Baseline: 63; 07/06/24 - 66 Goal status: ONGOING  6. Pt will be ind with final HEP for hip stretching, core mobility, and postural changes for the neck and low pain to maximize impact of posture on pain.  Baseline: No knowledge; 07/06/24 - Pt is independent with initial HEP for neck stretches, today progressed HEP to include cervical stability and hip flexibility along with initial core strength Goal Status: ONGOING   PLAN: PT FREQUENCY: 1-2x/week  PT DURATION: 8 weeks  PLANNED INTERVENTIONS: 97164- PT Re-evaluation, 97110-Therapeutic exercises, 97530- Therapeutic activity, 97112- Neuromuscular re-education, 97535- Self Care, [id number]- Manual therapy, [id number]- Aquatic Therapy, [id number]- Electrical stimulation (unattended), 20560 (1-2 muscles), 20561 (3+ muscles)- Dry Needling, Patient/Family education, Joint mobilization, Spinal mobilization, Cryotherapy, and Moist heat  PLAN FOR NEXT SESSION: Review and progress HEP, cont aquatic visits with general TE, general postural strengthening for neck, back, and bil hip pain, spinal mobs prn   Delon Darner, PTA 07/19/24 11:26 AM   [facility] [address] [place], [place], [address] Phone: [phone]   Fax:  [phone]

## 2024-07-20 DIAGNOSIS — D251 Intramural leiomyoma of uterus: Secondary | ICD-10-CM | POA: Diagnosis not present

## 2024-07-20 DIAGNOSIS — D252 Subserosal leiomyoma of uterus: Secondary | ICD-10-CM | POA: Diagnosis not present

## 2024-07-20 DIAGNOSIS — D250 Submucous leiomyoma of uterus: Secondary | ICD-10-CM | POA: Diagnosis not present

## 2024-07-21 ENCOUNTER — Other Ambulatory Visit: Payer: Self-pay | Admitting: Nurse Practitioner

## 2024-07-21 ENCOUNTER — Other Ambulatory Visit (HOSPITAL_COMMUNITY): Payer: Self-pay

## 2024-07-21 DIAGNOSIS — F419 Anxiety disorder, unspecified: Secondary | ICD-10-CM

## 2024-07-24 ENCOUNTER — Other Ambulatory Visit (HOSPITAL_COMMUNITY): Payer: Self-pay

## 2024-07-24 MED ORDER — HYDROXYZINE HCL 25 MG PO TABS
25.0000 mg | ORAL_TABLET | Freq: Three times a day (TID) | ORAL | 0 refills | Status: AC | PRN
  Filled 2024-07-24 – 2024-08-18 (×2): qty 30, 10d supply, fill #0

## 2024-07-26 NOTE — Addendum Note (Signed)
 Addended by: LARUE NUMBERS R on: 07/26/2024 11:12 PM   Modules accepted: Orders

## 2024-08-02 ENCOUNTER — Ambulatory Visit: Payer: Self-pay | Admitting: Physical Therapy

## 2024-08-02 ENCOUNTER — Other Ambulatory Visit (HOSPITAL_BASED_OUTPATIENT_CLINIC_OR_DEPARTMENT_OTHER): Payer: Self-pay

## 2024-08-02 ENCOUNTER — Encounter: Payer: Self-pay | Admitting: Physical Therapy

## 2024-08-02 ENCOUNTER — Other Ambulatory Visit (HOSPITAL_COMMUNITY): Payer: Self-pay

## 2024-08-02 DIAGNOSIS — R252 Cramp and spasm: Secondary | ICD-10-CM | POA: Diagnosis not present

## 2024-08-02 DIAGNOSIS — G8929 Other chronic pain: Secondary | ICD-10-CM

## 2024-08-02 DIAGNOSIS — M25511 Pain in right shoulder: Secondary | ICD-10-CM | POA: Diagnosis not present

## 2024-08-02 DIAGNOSIS — M6281 Muscle weakness (generalized): Secondary | ICD-10-CM | POA: Diagnosis not present

## 2024-08-02 DIAGNOSIS — M25552 Pain in left hip: Secondary | ICD-10-CM | POA: Diagnosis not present

## 2024-08-02 DIAGNOSIS — M25551 Pain in right hip: Secondary | ICD-10-CM | POA: Diagnosis not present

## 2024-08-02 NOTE — Therapy (Signed)
 OUTPATIENT PHYSICAL THERAPY UPPER AND LOWER EXTREMITY TREATMENT   Patient Name: Natasha [name] MRN: [id number] DOB:1966/05/15, 58 y.o., female Today's Date: 08/02/2024  END OF SESSION:  PT End of Session - 08/02/24 1015     Visit Number 10    Number of Visits 22    Date for PT Re-Evaluation 08/31/24    Authorization Type [facility] [employer]    PT Start Time 1014    PT Stop Time 1053    PT Time Calculation (min) 39 min    Activity Tolerance Patient tolerated treatment well    Behavior During Therapy WFL for tasks assessed/performed                Past Medical History:  Diagnosis Date   Colon polyps    Diabetes mellitus without complication (HCC)    pre diabetes   GERD (gastroesophageal reflux disease)    Hypertension    Obesity    Past Surgical History:  Procedure Laterality Date   AUGMENTATION MAMMAPLASTY     GASTRIC BYPASS     Patient Active Problem List   Diagnosis Date Noted   Leucopenia 07/16/2024   Obesity, unspecified 07/16/2024   Right hip pain 07/16/2024   Encounter for screening 07/16/2024   Class 3 severe obesity due to excess calories with serious comorbidity and body mass index (BMI) of 45.0 to 49.9 in adult 03/05/2024   Anxiety 03/05/2024   Other insomnia 03/05/2024   COVID-19 vaccination declined 11/03/2023   Abnormal glucose 12/06/2022   Essential hypertension 12/06/2022   Gastroesophageal reflux disease without esophagitis 12/06/2022   Class 3 drug-induced obesity with serious comorbidity and body mass index (BMI) of 40.0 to 44.9 in adult [facility]) 12/06/2022    PCP: Georgina Speaks, FNP   REFERRING PROVIDER: Genelle Standing, MD   REFERRING DIAG: [phone] (ICD-10-CM) - Chronic right shoulder pain  Scapular dyskinesis program  Anterior chain mobilization  THERAPY DIAG:  Muscle weakness (generalized)  Cramp and spasm  Pain of both hip joints  Chronic right shoulder pain  Rationale for Evaluation and Treatment: Rehabilitation  ONSET  DATE: many months ago  SUBJECTIVE:                                                                                                                                                                                      SUBJECTIVE STATEMENT: My hip pain has 30% less pain. I did a lot of water exercise on my trip to [place] .  Hand dominance: Right  PERTINENT HISTORY:  B hip pain, HTN  PAIN:  Are you having pain? None right now  PRECAUTIONS: None  RED FLAGS: None   WEIGHT BEARING RESTRICTIONS:  No  FALLS:  Has patient fallen in last 6 months? No  LIVING ENVIRONMENT: Lives with: lives with their family Lives in: House/apartment Stairs: Yes: Internal: 15 steps; on right going up Has following equipment at home: None  OCCUPATION: Nurse  PLOF: Independent  PATIENT GOALS: Less pain  NEXT MD VISIT: 6 weeks  OBJECTIVE:  Note: Objective measures were completed at Evaluation unless otherwise noted.  DIAGNOSTIC FINDINGS:  Mild A/C OA  PATIENT SURVEYS :  UEFS  Extreme difficulty/unable (0), Quite a bit of difficulty (1), Moderate difficulty (2), Little difficulty (3), No difficulty (4) Survey date:   07/06/24  Any of your usual work, household or school activities 3 3  2. Your usual hobbies, recreational/sport activities 0 1   3. Lifting a bag of groceries to waist level 4 3   4. Lifting a bag of groceries above your head 3 3  5. Grooming your hair 3 4  6. Pushing up on your hands (I.e. from bathtub or chair) 2 1  7. Preparing food (I.e. peeling/cutting) 4 4  8. Driving  3 4  9. Vacuuming, sweeping, or raking 3 3  10. Dressing  3 4  11. Doing up buttons 4 4  12. Using tools/appliances 4 4  13. Opening doors 4 4  14. Cleaning  4 3  15. Tying or lacing shoes 4 4  16. Sleeping  1 3  17. Laundering clothes (I.e. washing, ironing, folding) 4 4  18. Opening a jar 4 4  19. Throwing a ball 4 3  20. Carrying a small suitcase with your affected limb.  2 3  Score total:  63 66      COGNITION: Overall cognitive status: Within functional limits for tasks assessed     SENSATION: Not tested  POSTURE: Mild rounded shoulders,   CERVICAL ROM: * into R UT  Active ROM A/PROM (deg) eval  Flexion Full *  Extension 25*  Right lateral flexion 19 *  Left lateral flexion 14 relief  Right rotation 52  Left rotation 45   (Blank rows = not tested)   UPPER EXTREMITY ROM: Full B shoulder ROM, Relief with functional IR behind back   UPPER EXTREMITY MMT:  MMT Right eval Left eval Right 07/06/24  Shoulder flexion 4+  5  Shoulder extension 4+  4+  Shoulder abduction 4 mild discomfort  4+  Shoulder adduction     Shoulder internal rotation 5    Shoulder external rotation 5    Middle trapezius 4 5 4+  Lower trapezius 4- 5 4  (Blank rows = not tested)  SHOULDER SPECIAL TESTS: Impingement tests: Neer impingement test: negative and Hawkins/Kennedy impingement test: negative Rotator cuff assessment: Empty can test: positive  and Full can test: negative  scaption weak  CERVICAL SPECIAL TESTS: Neg compression/distraction, negative spurlings B  PALPATION:  Increased tension and TTP of R UT and levator scap. Pain with UPA mobs to lower cervical and upper thoracic spine  Hip Asessment: 06/02/24 Imaging: IMPRESSION: 1. Mild bilateral femoroacetabular osteoarthritis. 2. Left greater than right lumbosacral assimilation joints with associated moderate degenerative changes. This can be a source of lower back and/or hip pain.  PAIN:  Are you having pain? No, not currently  Subjective:  The hips have been hurting for months now starting when I was walking more.   If I sit down for awhile it gets stiff.  It hurts more in the groin on both sides I also have LBP - they definitiely go  together.   Objective:  Lumbar:  AROM  Flexion: hands to floor Extension: WNL and feels good Rot B: WNL Sidebend WNL  Hip AROM: WNL Hip PROM: painfree, some pull with cross body  position MMT: all 4+/5 without pain Palpation: +1-2 ttp to Rt lateral hip at greater trochanter, +1 to left hip Tightness in quads bil, bil lateral hip Lt>Rt Feels good with lumbar extension standing and prone  Posture: increased lumbar lordosis                                                                                                                              TREATMENT DATE:  [facility] Adult PT Treatment:                                                DATE:   08/02/24: Pt seen for aquatic therapy today.  Treatment took place in water 3.5-4.75 ft in depth at the [facility] pool. Temp of water was 91.  Pt entered/exited the pool via stairs using step to pattern with hand rail. *walking forward, back and side stepping in 3.6 ft UE push pull with rainbow floats 10x each *Farmers carry using yellow HB forward and back unilaterally then bilaterally. 4 lengths each position. -hip kicks 30x Bil 3 directions -pink bells forward/back 20x2 -Sit to stand from second step 3x10 -Underwater bicycle in horseback holding onto wall in corner 5 min  07/19/24:Pt seen for aquatic therapy today.  Treatment took place in water 3.5-4.75 ft in depth at the [facility] pool. Temp of water was 91.  Pt entered/exited the pool via stairs using step to pattern with hand rail. *walking forward, back and side stepping in 3.6 ft unsupported 10x each *Farmers carry using yellow HB forward and back unilaterally then bilaterally. 4 lengths each position. -hip kicks 20x Bil 3 directions -pink bells forward/back 20x -Sit to stand from second step 2x10   07/12/24 Pt seen for aquatic therapy today.  Treatment took place in water 3.5-4.75 ft in depth at the [facility] pool. Temp of water was 91.  Pt entered/exited the pool via stairs using step to pattern with hand rail.   *walking forward, back and side stepping in 3.6 ft unsupported *Farmers carry using yellow HB forward and back  unilaterally then bilaterally. *Horizontal shoulder add/abd; bow&arrow *shoulder depression/shrugging using yellow HB 2 x 10 *Ue support yellow HB: toe raises; heel raises; hip add/abd crossing midline; hip flex/extension; relaxed squats x~10 *row using rider band elbow straight then bent 2 x 10 *forward and backward, side stepping amb with arm swing, shoulder add/abd *STS from 3rd step x 10. Cues and demonstration for technique and execution, immediate standing balance   Pt requires the buoyancy and hydrostatic pressure of water for support, and to offload joints by unweighting joint load  by at least 50 % in navel deep water and by at least 75-80% in chest to neck deep water.  Viscosity of the water is needed for resistance of strengthening. Water current perturbations provides challenge to standing balance requiring increased core activation.     PATIENT EDUCATION: Education details: per today's note Person educated: Patient Education method: Explanation, Demonstration, and Handouts Education comprehension: verbalized understanding and returned demonstration  HOME EXERCISE PROGRAM: Access Code: [email or web address] URL: [email or web address] Date: 05/31/2024 Prepared by: Orvil Beuhring  Exercises - Standing Shoulder Horizontal Abduction with Resistance  - 1 x daily - 3 x weekly - 1-3 sets - 10 reps - Standing Shoulder Row with Anchored Resistance  - 1 x daily - 3 x weekly - 1-3 sets - 10 reps - Shoulder extension with resistance - Neutral  - 1 x daily - 3 x weekly - 1-3 sets - 10 reps - Standing Shoulder Single Arm PNF D2 Flexion with Resistance  - 1 x daily - 3 x weekly - 1-3 sets - 10 reps - Shoulder External Rotation and Scapular Retraction with Resistance  - 1 x daily - 7 x weekly - 1-3 sets - 10 reps - Doorway Pec Stretch at 60 Elevation  - 2 x daily - 7 x weekly - 1 sets - 2 reps - 30 sec hold - Seated Upper Trapezius Stretch  - 3 x daily - 7 x weekly - 1 sets - 1 reps -  30 hold - Seated Levator Scapulae Stretch  - 1 x daily - 7 x weekly - 1 sets - 1 reps - 30 hold - Cervical Retraction with Resistance  - 1 x daily - 7 x weekly - 1 sets - 3 reps - 10 hold  Patient Education - Office Posture  Access Code: [email or web address] URL: [email or web address] Date: 06/02/2024 Prepared by: Saddie Raw  Exercises - Supine Bridge  - 1 x daily - 7 x weekly - 1-3 sets - 10 reps - 20-30 seconds hold - Supine Lower Trunk Rotation  - 1 x daily - 7 x weekly - 1 sets - 10 reps - 5-6 sec hold - Supine Butterfly Groin Stretch  - 1 x daily - 7 x weekly - 1 sets - 2-3 reps - 20-30 seconds hold - Supine Posterior Pelvic Tilt  - 1 x daily - 7 x weekly - 1-3 sets - 10 reps - 20-30 seconds hold - Supine Figure 4 Piriformis Stretch  - 1 x daily - 7 x weekly - 2 sets - 3 reps - 20 seconds hold  ASSESSMENT:  CLINICAL IMPRESSION: Pt reports shoulder is 75% improved and hip is 30% improved. She is feeling more encouraged than ever. She is finding ways to get more water exercise in her life. Excellence response to todays exercises in the water.  OBJECTIVE IMPAIRMENTS: decreased activity tolerance, decreased ROM, decreased strength, increased muscle spasms, impaired flexibility, impaired UE functional use, postural dysfunction, obesity, and pain.   ACTIVITY LIMITATIONS: carrying, lifting, standing, and sleeping  PARTICIPATION LIMITATIONS: meal prep, cleaning, laundry, community activity, occupation, and yard work  PERSONAL FACTORS: Time since onset of injury/illness/exacerbation and 1-2 comorbidities: B hip pain, HTN are also affecting patient's functional outcome.   REHAB POTENTIAL: Excellent  CLINICAL DECISION MAKING: Evolving/moderate complexity  EVALUATION COMPLEXITY: Moderate  GOALS: Goals reviewed with patient? Yes  SHORT TERM GOALS: Target date: 06/02/2024   Patient will be independent with initial HEP.  Baseline:  Goal status: MET 6/25  2.  Improved sleep by 50%  secondary to decreased shoulder pain.  Baseline: 07/06/24 - 60% Goal status: MET  3.  Decreased shoulder pain by 30% with ADLs Baseline: 07/06/24 - 60% Goal status: MET   LONG TERM GOALS: Target date: 07/07/2024   Patient will be independent with advanced/ongoing HEP to improve outcomes and carryover.  Baseline:  Goal status: ONGOING  2.  Patient able to carry and lift without increased shoulder pain. Baseline: 07/06/24 - 60% improved Goal status: ONGOING  3.  Patient to demonstrate functional neck ROM without pain provocation.  Baseline: 07/06/24 - pt reports still tight but no more pain Goal status: MET  4.  Patient will demonstrate improved R shoulder and scapular strength to 5/5 in able to lift and carry without difficulty.  Baseline: See above MMT chart Goal status: ONGOING  5. Increased UEFS score to 72 or better demonstrating improved functional ability.  Baseline: 63; 07/06/24 - 66 Goal status: ONGOING  6. Pt will be ind with final HEP for hip stretching, core mobility, and postural changes for the neck and low pain to maximize impact of posture on pain.  Baseline: No knowledge; 07/06/24 - Pt is independent with initial HEP for neck stretches, today progressed HEP to include cervical stability and hip flexibility along with initial core strength Goal Status: ONGOING   PLAN: PT FREQUENCY: 1-2x/week  PT DURATION: 8 weeks  PLANNED INTERVENTIONS: 97164- PT Re-evaluation, 97110-Therapeutic exercises, 97530- Therapeutic activity, 97112- Neuromuscular re-education, 97535- Self Care, 02859- Manual therapy, [id number]- Aquatic Therapy, [id number]- Electrical stimulation (unattended), 20560 (1-2 muscles), 20561 (3+ muscles)- Dry Needling, Patient/Family education, Joint mobilization, Spinal mobilization, Cryotherapy, and Moist heat  PLAN FOR NEXT SESSION: Review and progress HEP, cont aquatic visits with general TE, general postural strengthening for neck, back, and bil hip pain, spinal mobs  prn   Delon Darner, PTA 08/02/24 10:47 AM   [facility] [address] [place], [place], [address] Phone: [phone]   Fax:  [phone]

## 2024-08-03 DIAGNOSIS — F3289 Other specified depressive episodes: Secondary | ICD-10-CM | POA: Diagnosis not present

## 2024-08-03 DIAGNOSIS — F1020 Alcohol dependence, uncomplicated: Secondary | ICD-10-CM | POA: Diagnosis not present

## 2024-08-04 ENCOUNTER — Other Ambulatory Visit: Payer: Self-pay

## 2024-08-08 ENCOUNTER — Other Ambulatory Visit: Payer: Self-pay

## 2024-08-09 ENCOUNTER — Ambulatory Visit: Attending: Orthopaedic Surgery | Admitting: Physical Therapy

## 2024-08-09 ENCOUNTER — Encounter: Payer: Self-pay | Admitting: Physical Therapy

## 2024-08-09 DIAGNOSIS — M25551 Pain in right hip: Secondary | ICD-10-CM | POA: Diagnosis not present

## 2024-08-09 DIAGNOSIS — G8929 Other chronic pain: Secondary | ICD-10-CM | POA: Insufficient documentation

## 2024-08-09 DIAGNOSIS — M542 Cervicalgia: Secondary | ICD-10-CM | POA: Insufficient documentation

## 2024-08-09 DIAGNOSIS — M6281 Muscle weakness (generalized): Secondary | ICD-10-CM | POA: Insufficient documentation

## 2024-08-09 DIAGNOSIS — M25511 Pain in right shoulder: Secondary | ICD-10-CM | POA: Diagnosis not present

## 2024-08-09 DIAGNOSIS — R252 Cramp and spasm: Secondary | ICD-10-CM | POA: Insufficient documentation

## 2024-08-09 DIAGNOSIS — M25552 Pain in left hip: Secondary | ICD-10-CM | POA: Diagnosis not present

## 2024-08-09 NOTE — Therapy (Signed)
 OUTPATIENT PHYSICAL THERAPY UPPER AND LOWER EXTREMITY TREATMENT   Patient Name: Natasha [name] MRN: [id number] DOB:1965/12/27, 58 y.o., female Today's Date: 08/09/2024  END OF SESSION:  PT End of Session - 08/09/24 1021     Visit Number 11    Number of Visits 22    Date for PT Re-Evaluation 08/31/24    Authorization Type [facility] [employer]    PT Start Time 1020    PT Stop Time 1105    PT Time Calculation (min) 45 min    Activity Tolerance Patient tolerated treatment well    Behavior During Therapy WFL for tasks assessed/performed                 Past Medical History:  Diagnosis Date   Colon polyps    Diabetes mellitus without complication (HCC)    pre diabetes   GERD (gastroesophageal reflux disease)    Hypertension    Obesity    Past Surgical History:  Procedure Laterality Date   AUGMENTATION MAMMAPLASTY     GASTRIC BYPASS     Patient Active Problem List   Diagnosis Date Noted   Leucopenia 07/16/2024   Obesity, unspecified 07/16/2024   Right hip pain 07/16/2024   Encounter for screening 07/16/2024   Class 3 severe obesity due to excess calories with serious comorbidity and body mass index (BMI) of 45.0 to 49.9 in adult 03/05/2024   Anxiety 03/05/2024   Other insomnia 03/05/2024   COVID-19 vaccination declined 11/03/2023   Abnormal glucose 12/06/2022   Essential hypertension 12/06/2022   Gastroesophageal reflux disease without esophagitis 12/06/2022   Class 3 drug-induced obesity with serious comorbidity and body mass index (BMI) of 40.0 to 44.9 in adult [facility]) 12/06/2022    PCP: Georgina Speaks, FNP   REFERRING PROVIDER: Genelle Standing, MD   REFERRING DIAG: [phone] (ICD-10-CM) - Chronic right shoulder pain  Scapular dyskinesis program  Anterior chain mobilization  THERAPY DIAG:  Muscle weakness (generalized)  Cramp and spasm  Pain of both hip joints  Chronic right shoulder pain  Rationale for Evaluation and Treatment: Rehabilitation  ONSET  DATE: many months ago  SUBJECTIVE:                                                                                                                                                                                      SUBJECTIVE STATEMENT: Doing my recumbent bike more consistently. I feel it in my thighs but it's ok. No current pain.   Hand dominance: Right  PERTINENT HISTORY:  B hip pain, HTN  PAIN:  Are you having pain? None right now  PRECAUTIONS: None  RED FLAGS: None   WEIGHT BEARING  RESTRICTIONS: No  FALLS:  Has patient fallen in last 6 months? No  LIVING ENVIRONMENT: Lives with: lives with their family Lives in: House/apartment Stairs: Yes: Internal: 15 steps; on right going up Has following equipment at home: None  OCCUPATION: Nurse  PLOF: Independent  PATIENT GOALS: Less pain  NEXT MD VISIT: 6 weeks  OBJECTIVE:  Note: Objective measures were completed at Evaluation unless otherwise noted.  DIAGNOSTIC FINDINGS:  Mild A/C OA  PATIENT SURVEYS :  UEFS  Extreme difficulty/unable (0), Quite a bit of difficulty (1), Moderate difficulty (2), Little difficulty (3), No difficulty (4) Survey date:   07/06/24  Any of your usual work, household or school activities 3 3  2. Your usual hobbies, recreational/sport activities 0 1   3. Lifting a bag of groceries to waist level 4 3   4. Lifting a bag of groceries above your head 3 3  5. Grooming your hair 3 4  6. Pushing up on your hands (I.e. from bathtub or chair) 2 1  7. Preparing food (I.e. peeling/cutting) 4 4  8. Driving  3 4  9. Vacuuming, sweeping, or raking 3 3  10. Dressing  3 4  11. Doing up buttons 4 4  12. Using tools/appliances 4 4  13. Opening doors 4 4  14. Cleaning  4 3  15. Tying or lacing shoes 4 4  16. Sleeping  1 3  17. Laundering clothes (I.e. washing, ironing, folding) 4 4  18. Opening a jar 4 4  19. Throwing a ball 4 3  20. Carrying a small suitcase with your affected limb.  2 3  Score  total:  63 66     COGNITION: Overall cognitive status: Within functional limits for tasks assessed     SENSATION: Not tested  POSTURE: Mild rounded shoulders,   CERVICAL ROM: * into R UT  Active ROM A/PROM (deg) eval  Flexion Full *  Extension 25*  Right lateral flexion 19 *  Left lateral flexion 14 relief  Right rotation 52  Left rotation 45   (Blank rows = not tested)   UPPER EXTREMITY ROM: Full B shoulder ROM, Relief with functional IR behind back   UPPER EXTREMITY MMT:  MMT Right eval Left eval Right 07/06/24  Shoulder flexion 4+  5  Shoulder extension 4+  4+  Shoulder abduction 4 mild discomfort  4+  Shoulder adduction     Shoulder internal rotation 5    Shoulder external rotation 5    Middle trapezius 4 5 4+  Lower trapezius 4- 5 4  (Blank rows = not tested)  SHOULDER SPECIAL TESTS: Impingement tests: Neer impingement test: negative and Hawkins/Kennedy impingement test: negative Rotator cuff assessment: Empty can test: positive  and Full can test: negative  scaption weak  CERVICAL SPECIAL TESTS: Neg compression/distraction, negative spurlings B  PALPATION:  Increased tension and TTP of R UT and levator scap. Pain with UPA mobs to lower cervical and upper thoracic spine  Hip Asessment: 06/02/24 Imaging: IMPRESSION: 1. Mild bilateral femoroacetabular osteoarthritis. 2. Left greater than right lumbosacral assimilation joints with associated moderate degenerative changes. This can be a source of lower back and/or hip pain.  PAIN:  Are you having pain? No, not currently  Subjective:  The hips have been hurting for months now starting when I was walking more.   If I sit down for awhile it gets stiff.  It hurts more in the groin on both sides I also have LBP - they definitiely  go together.   Objective:  Lumbar:  AROM  Flexion: hands to floor Extension: WNL and feels good Rot B: WNL Sidebend WNL  Hip AROM: WNL Hip PROM: painfree, some pull  with cross body position MMT: all 4+/5 without pain Palpation: +1-2 ttp to Rt lateral hip at greater trochanter, +1 to left hip Tightness in quads bil, bil lateral hip Lt>Rt Feels good with lumbar extension standing and prone  Posture: increased lumbar lordosis                                                                                                                              TREATMENT DATE:  [facility] Adult PT Treatment:                                                DATE:   08/09/24:  08/02/24: Pt seen for aquatic therapy today.  Treatment took place in water 3.5-4.75 ft in depth at the [facility] pool. Temp of water was 91.  Pt entered/exited the pool via stairs using step to pattern with hand rail. Seated LAQ for warm up 20x Bil. *walking forward, back and side stepping in 3.6 ft UE push pull with rainbow floats 10x each *Post pelvic tilt on wall 10x, then added yellow float core press 15x2 -hip kicks 20x Bil 3 direction: added ankle fins today -pink bells forward/back 20x2 -Sit to stand from second step 3x10 -Underwater bicycle in horseback holding onto wall in corner 8 min  07/19/24:Pt seen for aquatic therapy today.  Treatment took place in water 3.5-4.75 ft in depth at the [facility] pool. Temp of water was 91.  Pt entered/exited the pool via stairs using step to pattern with hand rail. *walking forward, back and side stepping in 3.6 ft unsupported 10x each *Farmers carry using yellow HB forward and back unilaterally then bilaterally. 4 lengths each position. -hip kicks 20x Bil 3 directions -pink bells forward/back 20x -Sit to stand from second step 2x10   07/12/24 Pt seen for aquatic therapy today.  Treatment took place in water 3.5-4.75 ft in depth at the [facility] pool. Temp of water was 91.  Pt entered/exited the pool via stairs using step to pattern with hand rail.   *walking forward, back and side stepping in 3.6 ft unsupported *Farmers  carry using yellow HB forward and back unilaterally then bilaterally. *Horizontal shoulder add/abd; bow&arrow *shoulder depression/shrugging using yellow HB 2 x 10 *Ue support yellow HB: toe raises; heel raises; hip add/abd crossing midline; hip flex/extension; relaxed squats x~10 *row using rider band elbow straight then bent 2 x 10 *forward and backward, side stepping amb with arm swing, shoulder add/abd *STS from 3rd step x 10. Cues and demonstration for technique and execution, immediate standing balance   Pt requires the buoyancy and hydrostatic pressure  of water for support, and to offload joints by unweighting joint load by at least 50 % in navel deep water and by at least 75-80% in chest to neck deep water.  Viscosity of the water is needed for resistance of strengthening. Water current perturbations provides challenge to standing balance requiring increased core activation.     PATIENT EDUCATION: Education details: per today's note Person educated: Patient Education method: Explanation, Demonstration, and Handouts Education comprehension: verbalized understanding and returned demonstration  HOME EXERCISE PROGRAM: Access Code: [email or web address] URL: [email or web address] Date: 05/31/2024 Prepared by: Orvil Beuhring  Exercises - Standing Shoulder Horizontal Abduction with Resistance  - 1 x daily - 3 x weekly - 1-3 sets - 10 reps - Standing Shoulder Row with Anchored Resistance  - 1 x daily - 3 x weekly - 1-3 sets - 10 reps - Shoulder extension with resistance - Neutral  - 1 x daily - 3 x weekly - 1-3 sets - 10 reps - Standing Shoulder Single Arm PNF D2 Flexion with Resistance  - 1 x daily - 3 x weekly - 1-3 sets - 10 reps - Shoulder External Rotation and Scapular Retraction with Resistance  - 1 x daily - 7 x weekly - 1-3 sets - 10 reps - Doorway Pec Stretch at 60 Elevation  - 2 x daily - 7 x weekly - 1 sets - 2 reps - 30 sec hold - Seated Upper Trapezius Stretch  - 3 x  daily - 7 x weekly - 1 sets - 1 reps - 30 hold - Seated Levator Scapulae Stretch  - 1 x daily - 7 x weekly - 1 sets - 1 reps - 30 hold - Cervical Retraction with Resistance  - 1 x daily - 7 x weekly - 1 sets - 3 reps - 10 hold  Patient Education - Office Posture  Access Code: [email or web address] URL: [email or web address] Date: 06/02/2024 Prepared by: Saddie Raw  Exercises - Supine Bridge  - 1 x daily - 7 x weekly - 1-3 sets - 10 reps - 20-30 seconds hold - Supine Lower Trunk Rotation  - 1 x daily - 7 x weekly - 1 sets - 10 reps - 5-6 sec hold - Supine Butterfly Groin Stretch  - 1 x daily - 7 x weekly - 1 sets - 2-3 reps - 20-30 seconds hold - Supine Posterior Pelvic Tilt  - 1 x daily - 7 x weekly - 1-3 sets - 10 reps - 20-30 seconds hold - Supine Figure 4 Piriformis Stretch  - 1 x daily - 7 x weekly - 2 sets - 3 reps - 20 seconds hold  ASSESSMENT:  CLINICAL IMPRESSION: Pt arrives pain free to aquatic PT. She reports she is doing well with her consistent recumbent bike work at home. We introduced ankle fins for hip kicks to increase surface area/resistance. She tolerated all progressions nicely today without pain. She does get quad soreness post exercise.    OBJECTIVE IMPAIRMENTS: decreased activity tolerance, decreased ROM, decreased strength, increased muscle spasms, impaired flexibility, impaired UE functional use, postural dysfunction, obesity, and pain.   ACTIVITY LIMITATIONS: carrying, lifting, standing, and sleeping  PARTICIPATION LIMITATIONS: meal prep, cleaning, laundry, community activity, occupation, and yard work  PERSONAL FACTORS: Time since onset of injury/illness/exacerbation and 1-2 comorbidities: B hip pain, HTN are also affecting patient's functional outcome.   REHAB POTENTIAL: Excellent  CLINICAL DECISION MAKING: Evolving/moderate complexity  EVALUATION COMPLEXITY: Moderate  GOALS: Goals reviewed with patient? Yes  SHORT TERM GOALS: Target date:  06/02/2024  Patient will be independent with initial HEP.  Baseline:  Goal status: MET 6/25  2.  Improved sleep by 50% secondary to decreased shoulder pain.  Baseline: 07/06/24 - 60% Goal status: MET  3.  Decreased shoulder pain by 30% with ADLs Baseline: 07/06/24 - 60% Goal status: MET   LONG TERM GOALS: Target date: 07/07/2024   Patient will be independent with advanced/ongoing HEP to improve outcomes and carryover.  Baseline:  Goal status: ONGOING  2.  Patient able to carry and lift without increased shoulder pain. Baseline: 07/06/24 - 60% improved Goal status: ONGOING  3.  Patient to demonstrate functional neck ROM without pain provocation.  Baseline: 07/06/24 - pt reports still tight but no more pain Goal status: MET  4.  Patient will demonstrate improved R shoulder and scapular strength to 5/5 in able to lift and carry without difficulty.  Baseline: See above MMT chart Goal status: ONGOING  5. Increased UEFS score to 72 or better demonstrating improved functional ability.  Baseline: 63; 07/06/24 - 66 Goal status: ONGOING  6. Pt will be ind with final HEP for hip stretching, core mobility, and postural changes for the neck and low pain to maximize impact of posture on pain.  Baseline: No knowledge; 07/06/24 - Pt is independent with initial HEP for neck stretches, today progressed HEP to include cervical stability and hip flexibility along with initial core strength Goal Status: ONGOING   PLAN: PT FREQUENCY: 1-2x/week  PT DURATION: 8 weeks  PLANNED INTERVENTIONS: 97164- PT Re-evaluation, 97110-Therapeutic exercises, 97530- Therapeutic activity, 97112- Neuromuscular re-education, 97535- Self Care, [id number]- Manual therapy, [id number]- Aquatic Therapy, [id number]- Electrical stimulation (unattended), 20560 (1-2 muscles), 20561 (3+ muscles)- Dry Needling, Patient/Family education, Joint mobilization, Spinal mobilization, Cryotherapy, and Moist heat  PLAN FOR NEXT SESSION: Review and  progress HEP, cont aquatic visits with general TE, general postural strengthening for neck, back, and bil hip pain, spinal mobs prn   Delon Darner, PTA 08/09/24 11:00 AM   [facility] [address] [place], [place], [address] Phone: [phone]   Fax:  [phone]

## 2024-08-10 DIAGNOSIS — F1020 Alcohol dependence, uncomplicated: Secondary | ICD-10-CM | POA: Diagnosis not present

## 2024-08-10 DIAGNOSIS — F3289 Other specified depressive episodes: Secondary | ICD-10-CM | POA: Diagnosis not present

## 2024-08-11 ENCOUNTER — Encounter: Payer: Self-pay | Admitting: Physical Therapy

## 2024-08-11 ENCOUNTER — Ambulatory Visit: Admitting: Physical Therapy

## 2024-08-11 DIAGNOSIS — G8929 Other chronic pain: Secondary | ICD-10-CM | POA: Diagnosis not present

## 2024-08-11 DIAGNOSIS — M6281 Muscle weakness (generalized): Secondary | ICD-10-CM

## 2024-08-11 DIAGNOSIS — M25551 Pain in right hip: Secondary | ICD-10-CM | POA: Diagnosis not present

## 2024-08-11 DIAGNOSIS — M542 Cervicalgia: Secondary | ICD-10-CM | POA: Diagnosis not present

## 2024-08-11 DIAGNOSIS — R252 Cramp and spasm: Secondary | ICD-10-CM | POA: Diagnosis not present

## 2024-08-11 DIAGNOSIS — M25511 Pain in right shoulder: Secondary | ICD-10-CM | POA: Diagnosis not present

## 2024-08-11 DIAGNOSIS — M25552 Pain in left hip: Secondary | ICD-10-CM | POA: Diagnosis not present

## 2024-08-11 NOTE — Therapy (Signed)
 OUTPATIENT PHYSICAL THERAPY UPPER AND LOWER EXTREMITY TREATMENT   Patient Name: Natasha [name] MRN: [id number] DOB:[birth year], 58 y.o., female Today's Date: 08/11/2024  END OF SESSION:  PT End of Session - 08/11/24 1212     Visit Number 12    Number of Visits 22    Date for PT Re-Evaluation 08/31/24    Authorization Type [facility] [employer]    PT Start Time 1212    PT Stop Time 1300    PT Time Calculation (min) 48 min    Activity Tolerance Patient tolerated treatment well    Behavior During Therapy WFL for tasks assessed/performed                  Past Medical History:  Diagnosis Date   Colon polyps    Diabetes mellitus without complication (HCC)    pre diabetes   GERD (gastroesophageal reflux disease)    Hypertension    Obesity    Past Surgical History:  Procedure Laterality Date   AUGMENTATION MAMMAPLASTY     GASTRIC BYPASS     Patient Active Problem List   Diagnosis Date Noted   Leucopenia 07/16/2024   Obesity, unspecified 07/16/2024   Right hip pain 07/16/2024   Encounter for screening 07/16/2024   Class 3 severe obesity due to excess calories with serious comorbidity and body mass index (BMI) of 45.0 to 49.9 in adult 03/05/2024   Anxiety 03/05/2024   Other insomnia 03/05/2024   COVID-19 vaccination declined 11/03/2023   Abnormal glucose 12/06/2022   Essential hypertension 12/06/2022   Gastroesophageal reflux disease without esophagitis 12/06/2022   Class 3 drug-induced obesity with serious comorbidity and body mass index (BMI) of 40.0 to 44.9 in adult [facility]) 12/06/2022    PCP: Georgina Speaks, FNP   REFERRING PROVIDER: Genelle Standing, MD   REFERRING DIAG: [phone] (ICD-10-CM) - Chronic right shoulder pain  Scapular dyskinesis program  Anterior chain mobilization  THERAPY DIAG:  Muscle weakness (generalized)  Cramp and spasm  Pain of both hip joints  Chronic right shoulder pain  Rationale for Evaluation and Treatment:  Rehabilitation  ONSET DATE: many months ago  SUBJECTIVE:                                                                                                                                                                                      SUBJECTIVE STATEMENT: Ooh was I sore from my workout! Pretty much all my muscles but mainly my thighs.   Hand dominance: Right  PERTINENT HISTORY:  B hip pain, HTN  PAIN:  Are you having pain? None right now, just muscle soreness.  PRECAUTIONS: None  RED FLAGS: None  WEIGHT BEARING RESTRICTIONS: No  FALLS:  Has patient fallen in last 6 months? No  LIVING ENVIRONMENT: Lives with: lives with their family Lives in: House/apartment Stairs: Yes: Internal: 15 steps; on right going up Has following equipment at home: None  OCCUPATION: Nurse  PLOF: Independent  PATIENT GOALS: Less pain  NEXT MD VISIT: 6 weeks  OBJECTIVE:  Note: Objective measures were completed at Evaluation unless otherwise noted.  DIAGNOSTIC FINDINGS:  Mild A/C OA  PATIENT SURVEYS :  UEFS  Extreme difficulty/unable (0), Quite a bit of difficulty (1), Moderate difficulty (2), Little difficulty (3), No difficulty (4) Survey date:   07/06/24  Any of your usual work, household or school activities 3 3  2. Your usual hobbies, recreational/sport activities 0 1   3. Lifting a bag of groceries to waist level 4 3   4. Lifting a bag of groceries above your head 3 3  5. Grooming your hair 3 4  6. Pushing up on your hands (I.e. from bathtub or chair) 2 1  7. Preparing food (I.e. peeling/cutting) 4 4  8. Driving  3 4  9. Vacuuming, sweeping, or raking 3 3  10. Dressing  3 4  11. Doing up buttons 4 4  12. Using tools/appliances 4 4  13. Opening doors 4 4  14. Cleaning  4 3  15. Tying or lacing shoes 4 4  16. Sleeping  1 3  17. Laundering clothes (I.e. washing, ironing, folding) 4 4  18. Opening a jar 4 4  19. Throwing a ball 4 3  20. Carrying a small suitcase with  your affected limb.  2 3  Score total:  63 66     COGNITION: Overall cognitive status: Within functional limits for tasks assessed     SENSATION: Not tested  POSTURE: Mild rounded shoulders,   CERVICAL ROM: * into R UT  Active ROM A/PROM (deg) eval  Flexion Full *  Extension 25*  Right lateral flexion 19 *  Left lateral flexion 14 relief  Right rotation 52  Left rotation 45   (Blank rows = not tested)   UPPER EXTREMITY ROM: Full B shoulder ROM, Relief with functional IR behind back   UPPER EXTREMITY MMT:  MMT Right eval Left eval Right 07/06/24  Shoulder flexion 4+  5  Shoulder extension 4+  4+  Shoulder abduction 4 mild discomfort  4+  Shoulder adduction     Shoulder internal rotation 5    Shoulder external rotation 5    Middle trapezius 4 5 4+  Lower trapezius 4- 5 4  (Blank rows = not tested)  SHOULDER SPECIAL TESTS: Impingement tests: Neer impingement test: negative and Hawkins/Kennedy impingement test: negative Rotator cuff assessment: Empty can test: positive  and Full can test: negative  scaption weak  CERVICAL SPECIAL TESTS: Neg compression/distraction, negative spurlings B  PALPATION:  Increased tension and TTP of R UT and levator scap. Pain with UPA mobs to lower cervical and upper thoracic spine  Hip Asessment: 06/02/24 Imaging: IMPRESSION: 1. Mild bilateral femoroacetabular osteoarthritis. 2. Left greater than right lumbosacral assimilation joints with associated moderate degenerative changes. This can be a source of lower back and/or hip pain.  PAIN:  Are you having pain? No, not currently  Subjective:  The hips have been hurting for months now starting when I was walking more.   If I sit down for awhile it gets stiff.  It hurts more in the groin on both sides I also have LBP -  they definitiely go together.   Objective:  Lumbar:  AROM  Flexion: hands to floor Extension: WNL and feels good Rot B: WNL Sidebend WNL  Hip AROM:  WNL Hip PROM: painfree, some pull with cross body position MMT: all 4+/5 without pain Palpation: +1-2 ttp to Rt lateral hip at greater trochanter, +1 to left hip Tightness in quads bil, bil lateral hip Lt>Rt Feels good with lumbar extension standing and prone  Posture: increased lumbar lordosis                                                                                                                              TREATMENT DATE:  [facility] Adult PT Treatment:                                                DATE:   08/11/24: Pt seen for aquatic therapy today.  Treatment took place in water 3.5-4.75 ft in depth at the [facility] pool. Temp of water was 91.  Pt entered/exited the pool via stairs using step to pattern with hand rail. Seated LAQ for warm up 20x Bil. *walking forward, back and side stepping in 3.6 ft UE push pull with rainbow floats 10x each *Post pelvic tilt on wall 10x, then added yellow float core press 15x2 -hip kicks 20x Bil 3 direction: ankle fins today -pink bells forward/back 20x2 -Sit to stand from second step 3x10 -Underwater bicycle in horseback holding onto wall in corner 8 min  08/09/24: Pt seen for aquatic therapy today.  Treatment took place in water 3.5-4.75 ft in depth at the [facility] pool. Temp of water was 91.  Pt entered/exited the pool via stairs using step to pattern with hand rail. Seated LAQ for warm up 20x Bil. *walking forward, back and side stepping in 3.6 ft UE push pull with rainbow floats 10x each *Post pelvic tilt on wall 10x, then added yellow float core press 15x2 -hip kicks 20x Bil 3 direction: added ankle fins today -pink bells forward/back 20x2 -Sit to stand from second step 3x10 -Underwater bicycle in horseback holding onto wall in corner 8 min  08/02/24: Pt seen for aquatic therapy today.  Treatment took place in water 3.5-4.75 ft in depth at the [facility] pool. Temp of water was 91.  Pt entered/exited the pool  via stairs using step to pattern with hand rail. *walking forward, back and side stepping in 3.6 ft UE push pull with rainbow floats 10x each -hip kicks10x Bil 3 direction: -pink bells forward/back 20x2 -Sit to stand from second step 3x10 -Underwater bicycle in horseback holding onto wall in corner 6 min    PATIENT EDUCATION: Education details: per today's note Person educated: Patient Education method: Programmer, multimedia, Facilities manager, and Handouts Education comprehension: verbalized understanding and returned demonstration  HOME EXERCISE PROGRAM: Access  Code: [email or web address] URL: [email or web address] Date: 05/31/2024 Prepared by: Orvil Beuhring  Exercises - Standing Shoulder Horizontal Abduction with Resistance  - 1 x daily - 3 x weekly - 1-3 sets - 10 reps - Standing Shoulder Row with Anchored Resistance  - 1 x daily - 3 x weekly - 1-3 sets - 10 reps - Shoulder extension with resistance - Neutral  - 1 x daily - 3 x weekly - 1-3 sets - 10 reps - Standing Shoulder Single Arm PNF D2 Flexion with Resistance  - 1 x daily - 3 x weekly - 1-3 sets - 10 reps - Shoulder External Rotation and Scapular Retraction with Resistance  - 1 x daily - 7 x weekly - 1-3 sets - 10 reps - Doorway Pec Stretch at 60 Elevation  - 2 x daily - 7 x weekly - 1 sets - 2 reps - 30 sec hold - Seated Upper Trapezius Stretch  - 3 x daily - 7 x weekly - 1 sets - 1 reps - 30 hold - Seated Levator Scapulae Stretch  - 1 x daily - 7 x weekly - 1 sets - 1 reps - 30 hold - Cervical Retraction with Resistance  - 1 x daily - 7 x weekly - 1 sets - 3 reps - 10 hold  Patient Education - Office Posture  Access Code: [email or web address] URL: [email or web address] Date: 06/02/2024 Prepared by: Saddie Raw  Exercises - Supine Bridge  - 1 x daily - 7 x weekly - 1-3 sets - 10 reps - 20-30 seconds hold - Supine Lower Trunk Rotation  - 1 x daily - 7 x weekly - 1 sets - 10 reps - 5-6 sec hold - Supine Butterfly Groin  Stretch  - 1 x daily - 7 x weekly - 1 sets - 2-3 reps - 20-30 seconds hold - Supine Posterior Pelvic Tilt  - 1 x daily - 7 x weekly - 1-3 sets - 10 reps - 20-30 seconds hold - Supine Figure 4 Piriformis Stretch  - 1 x daily - 7 x weekly - 2 sets - 3 reps - 20 seconds hold  ASSESSMENT:  CLINICAL IMPRESSION: Due to level of muscle soreness from the treatment on Wednesday, todays session was exactly the same as it is a very good level for her.There was no complaints of pain throughout todays session.  OBJECTIVE IMPAIRMENTS: decreased activity tolerance, decreased ROM, decreased strength, increased muscle spasms, impaired flexibility, impaired UE functional use, postural dysfunction, obesity, and pain.   ACTIVITY LIMITATIONS: carrying, lifting, standing, and sleeping  PARTICIPATION LIMITATIONS: meal prep, cleaning, laundry, community activity, occupation, and yard work  PERSONAL FACTORS: Time since onset of injury/illness/exacerbation and 1-2 comorbidities: B hip pain, HTN are also affecting patient's functional outcome.   REHAB POTENTIAL: Excellent  CLINICAL DECISION MAKING: Evolving/moderate complexity  EVALUATION COMPLEXITY: Moderate  GOALS: Goals reviewed with patient? Yes  SHORT TERM GOALS: Target date: 06/02/2024   Patient will be independent with initial HEP.  Baseline:  Goal status: MET 6/25  2.  Improved sleep by 50% secondary to decreased shoulder pain.  Baseline: 07/06/24 - 60% Goal status: MET  3.  Decreased shoulder pain by 30% with ADLs Baseline: 07/06/24 - 60% Goal status: MET   LONG TERM GOALS: Target date: 07/07/2024   Patient will be independent with advanced/ongoing HEP to improve outcomes and carryover.  Baseline:  Goal status: ONGOING  2.  Patient able to carry and lift without increased shoulder pain. Baseline: 07/06/24 - 60% improved Goal status: ONGOING  3.  Patient to demonstrate functional neck ROM without pain provocation.  Baseline: 07/06/24 - pt  reports still tight but no more pain Goal status: MET  4.  Patient will demonstrate improved R shoulder and scapular strength to 5/5 in able to lift and carry without difficulty.  Baseline: See above MMT chart Goal status: ONGOING  5. Increased UEFS score to 72 or better demonstrating improved functional ability.  Baseline: 63; 07/06/24 - 66 Goal status: ONGOING  6. Pt will be ind with final HEP for hip stretching, core mobility, and postural changes for the neck and low pain to maximize impact of posture on pain.  Baseline: No knowledge; 07/06/24 - Pt is independent with initial HEP for neck stretches, today progressed HEP to include cervical stability and hip flexibility along with initial core strength Goal Status: ONGOING   PLAN: PT FREQUENCY: 1-2x/week  PT DURATION: 8 weeks  PLANNED INTERVENTIONS: 97164- PT Re-evaluation, 97110-Therapeutic exercises, 97530- Therapeutic activity, 97112- Neuromuscular re-education, 97535- Self Care, [id number]- Manual therapy, [id number]- Aquatic Therapy, [id number]- Electrical stimulation (unattended), 20560 (1-2 muscles), 20561 (3+ muscles)- Dry Needling, Patient/Family education, Joint mobilization, Spinal mobilization, Cryotherapy, and Moist heat  PLAN FOR NEXT SESSION: Review and progress HEP, cont aquatic visits with general TE, general postural strengthening for neck, back, and bil hip pain, spinal mobs prn   Delon Darner, PTA 08/11/24 12:40 PM   [facility] [address] [place], [place], [address] Phone: [phone]   Fax:  [phone]

## 2024-08-14 ENCOUNTER — Other Ambulatory Visit: Payer: Self-pay

## 2024-08-14 DIAGNOSIS — I158 Other secondary hypertension: Secondary | ICD-10-CM

## 2024-08-14 MED ORDER — AMLODIPINE BESYLATE 5 MG PO TABS: 5.0000 mg | ORAL_TABLET | Freq: Every day | ORAL | 2 refills | Status: AC

## 2024-08-16 ENCOUNTER — Encounter: Payer: Self-pay | Admitting: Physical Therapy

## 2024-08-16 ENCOUNTER — Ambulatory Visit: Admitting: Physical Therapy

## 2024-08-16 DIAGNOSIS — G8929 Other chronic pain: Secondary | ICD-10-CM

## 2024-08-16 DIAGNOSIS — M542 Cervicalgia: Secondary | ICD-10-CM | POA: Diagnosis not present

## 2024-08-16 DIAGNOSIS — M6281 Muscle weakness (generalized): Secondary | ICD-10-CM | POA: Diagnosis not present

## 2024-08-16 DIAGNOSIS — R252 Cramp and spasm: Secondary | ICD-10-CM

## 2024-08-16 DIAGNOSIS — M25552 Pain in left hip: Secondary | ICD-10-CM | POA: Diagnosis not present

## 2024-08-16 DIAGNOSIS — M25511 Pain in right shoulder: Secondary | ICD-10-CM | POA: Diagnosis not present

## 2024-08-16 DIAGNOSIS — M25551 Pain in right hip: Secondary | ICD-10-CM | POA: Diagnosis not present

## 2024-08-16 NOTE — Therapy (Signed)
 OUTPATIENT PHYSICAL THERAPY UPPER AND LOWER EXTREMITY TREATMENT   Patient Name: Natasha [name] MRN: [id number] DOB:11/16/1966, 58 y.o., female Today's Date: 08/16/2024  END OF SESSION:  PT End of Session - 08/16/24 1042     Visit Number 13    Number of Visits 22    Date for PT Re-Evaluation 08/31/24    Authorization Type [facility] [employer]    PT Start Time 1015    PT Stop Time 1110    PT Time Calculation (min) 55 min    Activity Tolerance Patient tolerated treatment well    Behavior During Therapy WFL for tasks assessed/performed                  Past Medical History:  Diagnosis Date   Colon polyps    Diabetes mellitus without complication (HCC)    pre diabetes   GERD (gastroesophageal reflux disease)    Hypertension    Obesity    Past Surgical History:  Procedure Laterality Date   AUGMENTATION MAMMAPLASTY     GASTRIC BYPASS     Patient Active Problem List   Diagnosis Date Noted   Leucopenia 07/16/2024   Obesity, unspecified 07/16/2024   Right hip pain 07/16/2024   Encounter for screening 07/16/2024   Class 3 severe obesity due to excess calories with serious comorbidity and body mass index (BMI) of 45.0 to 49.9 in adult 03/05/2024   Anxiety 03/05/2024   Other insomnia 03/05/2024   COVID-19 vaccination declined 11/03/2023   Abnormal glucose 12/06/2022   Essential hypertension 12/06/2022   Gastroesophageal reflux disease without esophagitis 12/06/2022   Class 3 drug-induced obesity with serious comorbidity and body mass index (BMI) of 40.0 to 44.9 in adult [facility]) 12/06/2022    PCP: Georgina Speaks, FNP   REFERRING PROVIDER: Genelle Standing, MD   REFERRING DIAG: [phone] (ICD-10-CM) - Chronic right shoulder pain  Scapular dyskinesis program  Anterior chain mobilization  THERAPY DIAG:  Muscle weakness (generalized)  Cramp and spasm  Pain of both hip joints  Chronic right shoulder pain  Rationale for Evaluation and Treatment:  Rehabilitation  ONSET DATE: many months ago  SUBJECTIVE:                                                                                                                                                                                      SUBJECTIVE STATEMENT: Less sore which was good. Declines are what still get me in the front of my hip..   Hand dominance: Right  PERTINENT HISTORY:  B hip pain, HTN  PAIN:  Are you having pain? None right now, just muscle soreness.  PRECAUTIONS: None  RED FLAGS: None  WEIGHT BEARING RESTRICTIONS: No  FALLS:  Has patient fallen in last 6 months? No  LIVING ENVIRONMENT: Lives with: lives with their family Lives in: House/apartment Stairs: Yes: Internal: 15 steps; on right going up Has following equipment at home: None  OCCUPATION: Nurse  PLOF: Independent  PATIENT GOALS: Less pain  NEXT MD VISIT: 6 weeks  OBJECTIVE:  Note: Objective measures were completed at Evaluation unless otherwise noted.  DIAGNOSTIC FINDINGS:  Mild A/C OA  PATIENT SURVEYS :  UEFS  Extreme difficulty/unable (0), Quite a bit of difficulty (1), Moderate difficulty (2), Little difficulty (3), No difficulty (4) Survey date:   07/06/24  Any of your usual work, household or school activities 3 3  2. Your usual hobbies, recreational/sport activities 0 1   3. Lifting a bag of groceries to waist level 4 3   4. Lifting a bag of groceries above your head 3 3  5. Grooming your hair 3 4  6. Pushing up on your hands (I.e. from bathtub or chair) 2 1  7. Preparing food (I.e. peeling/cutting) 4 4  8. Driving  3 4  9. Vacuuming, sweeping, or raking 3 3  10. Dressing  3 4  11. Doing up buttons 4 4  12. Using tools/appliances 4 4  13. Opening doors 4 4  14. Cleaning  4 3  15. Tying or lacing shoes 4 4  16. Sleeping  1 3  17. Laundering clothes (I.e. washing, ironing, folding) 4 4  18. Opening a jar 4 4  19. Throwing a ball 4 3  20. Carrying a small suitcase with  your affected limb.  2 3  Score total:  63 66     COGNITION: Overall cognitive status: Within functional limits for tasks assessed     SENSATION: Not tested  POSTURE: Mild rounded shoulders,   CERVICAL ROM: * into R UT  Active ROM A/PROM (deg) eval  Flexion Full *  Extension 25*  Right lateral flexion 19 *  Left lateral flexion 14 relief  Right rotation 52  Left rotation 45   (Blank rows = not tested)   UPPER EXTREMITY ROM: Full B shoulder ROM, Relief with functional IR behind back   UPPER EXTREMITY MMT:  MMT Right eval Left eval Right 07/06/24  Shoulder flexion 4+  5  Shoulder extension 4+  4+  Shoulder abduction 4 mild discomfort  4+  Shoulder adduction     Shoulder internal rotation 5    Shoulder external rotation 5    Middle trapezius 4 5 4+  Lower trapezius 4- 5 4  (Blank rows = not tested)  SHOULDER SPECIAL TESTS: Impingement tests: Neer impingement test: negative and Hawkins/Kennedy impingement test: negative Rotator cuff assessment: Empty can test: positive  and Full can test: negative  scaption weak  CERVICAL SPECIAL TESTS: Neg compression/distraction, negative spurlings B  PALPATION:  Increased tension and TTP of R UT and levator scap. Pain with UPA mobs to lower cervical and upper thoracic spine  Hip Asessment: 06/02/24 Imaging: IMPRESSION: 1. Mild bilateral femoroacetabular osteoarthritis. 2. Left greater than right lumbosacral assimilation joints with associated moderate degenerative changes. This can be a source of lower back and/or hip pain.  PAIN:  Are you having pain? No, not currently  Subjective:  The hips have been hurting for months now starting when I was walking more.   If I sit down for awhile it gets stiff.  It hurts more in the groin on both sides I also have LBP -  they definitiely go together.   Objective:  Lumbar:  AROM  Flexion: hands to floor Extension: WNL and feels good Rot B: WNL Sidebend WNL  Hip AROM:  WNL Hip PROM: painfree, some pull with cross body position MMT: all 4+/5 without pain Palpation: +1-2 ttp to Rt lateral hip at greater trochanter, +1 to left hip Tightness in quads bil, bil lateral hip Lt>Rt Feels good with lumbar extension standing and prone  Posture: increased lumbar lordosis                                                                                                                              TREATMENT DATE:  [facility] Adult PT Treatment:                                                DATE:   08/16/24: Pt seen for aquatic therapy today.  Treatment took place in water 3.5-4.75 ft in depth at the [facility] pool. Temp of water was 91.  Pt entered/exited the pool via stairs using step to pattern with hand rail. Seated LAQ for warm up 20x Bil. *walking forward, back and side stepping in 3.6 ft UE push pull with rainbow floats 10x each: good speed increase resulting in increased current *Post pelvic tilt on wall 10x, then added yellow float core press 15x2 -hip kicks 20x2 Bil 3 direction: ankle fins today -pink bells forward/back 20x2, added stagger stance 20x each side -Sit to stand from second step 3x10 -Trial of resisted walking forward and back using yellow bungee chord 10x -Underwater bicycle in horseback holding onto wall in corner 9 min  08/11/24: Pt seen for aquatic therapy today.  Treatment took place in water 3.5-4.75 ft in depth at the [facility] pool. Temp of water was 91.  Pt entered/exited the pool via stairs using step to pattern with hand rail. Seated LAQ for warm up 20x Bil. *walking forward, back and side stepping in 3.6 ft UE push pull with rainbow floats 10x each *Post pelvic tilt on wall 10x, then added yellow float core press 15x2 -hip kicks 20x Bil 3 direction: ankle fins today -pink bells forward/back 20x2 -Sit to stand from second step 3x10 -Underwater bicycle in horseback holding onto wall in corner 8 min  08/09/24: Pt seen for  aquatic therapy today.  Treatment took place in water 3.5-4.75 ft in depth at the [facility] pool. Temp of water was 91.  Pt entered/exited the pool via stairs using step to pattern with hand rail. Seated LAQ for warm up 20x Bil. *walking forward, back and side stepping in 3.6 ft UE push pull with rainbow floats 10x each *Post pelvic tilt on wall 10x, then added yellow float core press 15x2 -hip kicks 20x Bil 3 direction: added ankle fins today -pink bells forward/back 20x2 -  Sit to stand from second step 3x10 -Underwater bicycle in horseback holding onto wall in corner 8 min  PATIENT EDUCATION: Education details: per today's note Person educated: Patient Education method: Programmer, multimedia, Demonstration, and Handouts Education comprehension: verbalized understanding and returned demonstration  HOME EXERCISE PROGRAM: Access Code: [email or web address] URL: [email or web address] Date: 05/31/2024 Prepared by: Orvil Beuhring  Exercises - Standing Shoulder Horizontal Abduction with Resistance  - 1 x daily - 3 x weekly - 1-3 sets - 10 reps - Standing Shoulder Row with Anchored Resistance  - 1 x daily - 3 x weekly - 1-3 sets - 10 reps - Shoulder extension with resistance - Neutral  - 1 x daily - 3 x weekly - 1-3 sets - 10 reps - Standing Shoulder Single Arm PNF D2 Flexion with Resistance  - 1 x daily - 3 x weekly - 1-3 sets - 10 reps - Shoulder External Rotation and Scapular Retraction with Resistance  - 1 x daily - 7 x weekly - 1-3 sets - 10 reps - Doorway Pec Stretch at 60 Elevation  - 2 x daily - 7 x weekly - 1 sets - 2 reps - 30 sec hold - Seated Upper Trapezius Stretch  - 3 x daily - 7 x weekly - 1 sets - 1 reps - 30 hold - Seated Levator Scapulae Stretch  - 1 x daily - 7 x weekly - 1 sets - 1 reps - 30 hold - Cervical Retraction with Resistance  - 1 x daily - 7 x weekly - 1 sets - 3 reps - 10 hold  Patient Education - Office Posture  Access Code: [email or web address] URL:  [email or web address] Date: 06/02/2024 Prepared by: Saddie Raw  Exercises - Supine Bridge  - 1 x daily - 7 x weekly - 1-3 sets - 10 reps - 20-30 seconds hold - Supine Lower Trunk Rotation  - 1 x daily - 7 x weekly - 1 sets - 10 reps - 5-6 sec hold - Supine Butterfly Groin Stretch  - 1 x daily - 7 x weekly - 1 sets - 2-3 reps - 20-30 seconds hold - Supine Posterior Pelvic Tilt  - 1 x daily - 7 x weekly - 1-3 sets - 10 reps - 20-30 seconds hold - Supine Figure 4 Piriformis Stretch  - 1 x daily - 7 x weekly - 2 sets - 3 reps - 20 seconds hold  ASSESSMENT:  CLINICAL IMPRESSION: Tolerating all aquatics well. Pt's main complaint is stepping or walking down a decline. This is the main trigger for her anterior hip pain. This pain does not present exercising in the water. Added a trail of resisted walking to simulate more of an eccentric load to her hip flexor group.    OBJECTIVE IMPAIRMENTS: decreased activity tolerance, decreased ROM, decreased strength, increased muscle spasms, impaired flexibility, impaired UE functional use, postural dysfunction, obesity, and pain.   ACTIVITY LIMITATIONS: carrying, lifting, standing, and sleeping  PARTICIPATION LIMITATIONS: meal prep, cleaning, laundry, community activity, occupation, and yard work  PERSONAL FACTORS: Time since onset of injury/illness/exacerbation and 1-2 comorbidities: B hip pain, HTN are also affecting patient's functional outcome.   REHAB POTENTIAL: Excellent  CLINICAL DECISION MAKING: Evolving/moderate complexity  EVALUATION COMPLEXITY: Moderate  GOALS: Goals reviewed with patient? Yes  SHORT TERM GOALS: Target date: 06/02/2024   Patient will be independent with initial HEP.  Baseline:  Goal status: MET 6/25  2.  Improved sleep by 50% secondary to decreased shoulder pain.  Baseline: 07/06/24 - 60% Goal status: MET  3.  Decreased shoulder pain by 30% with ADLs Baseline: 07/06/24 - 60% Goal status: MET   LONG  TERM GOALS: Target date: 07/07/2024   Patient will be independent with advanced/ongoing HEP to improve outcomes and carryover.  Baseline:  Goal status: ONGOING  2.  Patient able to carry and lift without increased shoulder pain. Baseline: 07/06/24 - 60% improved Goal status: ONGOING  3.  Patient to demonstrate functional neck ROM without pain provocation.  Baseline: 07/06/24 - pt reports still tight but no more pain Goal status: MET  4.  Patient will demonstrate improved R shoulder and scapular strength to 5/5 in able to lift and carry without difficulty.  Baseline: See above MMT chart Goal status: ONGOING  5. Increased UEFS score to 72 or better demonstrating improved functional ability.  Baseline: 63; 07/06/24 - 66 Goal status: ONGOING  6. Pt will be ind with final HEP for hip stretching, core mobility, and postural changes for the neck and low pain to maximize impact of posture on pain.  Baseline: No knowledge; 07/06/24 - Pt is independent with initial HEP for neck stretches, today progressed HEP to include cervical stability and hip flexibility along with initial core strength Goal Status: ONGOING   PLAN: PT FREQUENCY: 1-2x/week  PT DURATION: 8 weeks  PLANNED INTERVENTIONS: 97164- PT Re-evaluation, 97110-Therapeutic exercises, 97530- Therapeutic activity, 97112- Neuromuscular re-education, 97535- Self Care, 02859- Manual therapy, [id number]- Aquatic Therapy, [id number]- Electrical stimulation (unattended), 20560 (1-2 muscles), 20561 (3+ muscles)- Dry Needling, Patient/Family education, Joint mobilization, Spinal mobilization, Cryotherapy, and Moist heat  PLAN FOR NEXT SESSION: Review and progress HEP, cont aquatic visits with general TE, general postural strengthening for neck, back, and bil hip pain, spinal mobs prn   Delon Darner, PTA 08/16/24 11:04 AM   [facility] [address] [place], [place], [address] Phone: [phone]    Fax:  [phone]

## 2024-08-17 DIAGNOSIS — F1020 Alcohol dependence, uncomplicated: Secondary | ICD-10-CM | POA: Diagnosis not present

## 2024-08-17 DIAGNOSIS — F3289 Other specified depressive episodes: Secondary | ICD-10-CM | POA: Diagnosis not present

## 2024-08-18 ENCOUNTER — Other Ambulatory Visit: Payer: Self-pay

## 2024-08-18 ENCOUNTER — Encounter: Payer: Self-pay | Admitting: Physical Therapy

## 2024-08-18 ENCOUNTER — Other Ambulatory Visit (HOSPITAL_BASED_OUTPATIENT_CLINIC_OR_DEPARTMENT_OTHER): Payer: Self-pay

## 2024-08-18 ENCOUNTER — Ambulatory Visit: Admitting: Physical Therapy

## 2024-08-18 DIAGNOSIS — M25511 Pain in right shoulder: Secondary | ICD-10-CM | POA: Diagnosis not present

## 2024-08-18 DIAGNOSIS — R252 Cramp and spasm: Secondary | ICD-10-CM

## 2024-08-18 DIAGNOSIS — M25552 Pain in left hip: Secondary | ICD-10-CM

## 2024-08-18 DIAGNOSIS — M6281 Muscle weakness (generalized): Secondary | ICD-10-CM | POA: Diagnosis not present

## 2024-08-18 DIAGNOSIS — M25551 Pain in right hip: Secondary | ICD-10-CM | POA: Diagnosis not present

## 2024-08-18 DIAGNOSIS — G8929 Other chronic pain: Secondary | ICD-10-CM | POA: Diagnosis not present

## 2024-08-18 DIAGNOSIS — M542 Cervicalgia: Secondary | ICD-10-CM | POA: Diagnosis not present

## 2024-08-18 NOTE — Therapy (Signed)
 OUTPATIENT PHYSICAL THERAPY UPPER AND LOWER EXTREMITY TREATMENT   Patient Name: Natasha [name] MRN: [id number] DOB:[birth year], 58 y.o., female Today's Date: 08/18/2024  END OF SESSION:  PT End of Session - 08/18/24 1211     Visit Number 14    Number of Visits 22    Date for PT Re-Evaluation 08/31/24    Authorization Type [facility] [employer]    PT Start Time 1211    PT Stop Time 1300    PT Time Calculation (min) 49 min    Activity Tolerance Patient tolerated treatment well    Behavior During Therapy WFL for tasks assessed/performed                   Past Medical History:  Diagnosis Date   Colon polyps    Diabetes mellitus without complication (HCC)    pre diabetes   GERD (gastroesophageal reflux disease)    Hypertension    Obesity    Past Surgical History:  Procedure Laterality Date   AUGMENTATION MAMMAPLASTY     GASTRIC BYPASS     Patient Active Problem List   Diagnosis Date Noted   Leucopenia 07/16/2024   Obesity, unspecified 07/16/2024   Right hip pain 07/16/2024   Encounter for screening 07/16/2024   Class 3 severe obesity due to excess calories with serious comorbidity and body mass index (BMI) of 45.0 to 49.9 in adult 03/05/2024   Anxiety 03/05/2024   Other insomnia 03/05/2024   COVID-19 vaccination declined 11/03/2023   Abnormal glucose 12/06/2022   Essential hypertension 12/06/2022   Gastroesophageal reflux disease without esophagitis 12/06/2022   Class 3 drug-induced obesity with serious comorbidity and body mass index (BMI) of 40.0 to 44.9 in adult [facility]) 12/06/2022    PCP: Georgina Speaks, FNP   REFERRING PROVIDER: Genelle Standing, MD   REFERRING DIAG: [phone] (ICD-10-CM) - Chronic right shoulder pain  Scapular dyskinesis program  Anterior chain mobilization  THERAPY DIAG:  Muscle weakness (generalized)  Cramp and spasm  Pain of both hip joints  Chronic right shoulder pain  Rationale for Evaluation and Treatment:  Rehabilitation  ONSET DATE: many months ago  SUBJECTIVE:                                                                                                                                                                                      SUBJECTIVE STATEMENT: I feel like I am getting stronger and also feel optimistic.  Hand dominance: Right  PERTINENT HISTORY:  B hip pain, HTN  PAIN:  Are you having pain? None right now, just muscle soreness.  PRECAUTIONS: None  RED FLAGS: None   WEIGHT BEARING RESTRICTIONS: No  FALLS:  Has patient fallen in last 6 months? No  LIVING ENVIRONMENT: Lives with: lives with their family Lives in: House/apartment Stairs: Yes: Internal: 15 steps; on right going up Has following equipment at home: None  OCCUPATION: Nurse  PLOF: Independent  PATIENT GOALS: Less pain  NEXT MD VISIT: 6 weeks  OBJECTIVE:  Note: Objective measures were completed at Evaluation unless otherwise noted.  DIAGNOSTIC FINDINGS:  Mild A/C OA  PATIENT SURVEYS :  UEFS  Extreme difficulty/unable (0), Quite a bit of difficulty (1), Moderate difficulty (2), Little difficulty (3), No difficulty (4) Survey date:   07/06/24  Any of your usual work, household or school activities 3 3  2. Your usual hobbies, recreational/sport activities 0 1   3. Lifting a bag of groceries to waist level 4 3   4. Lifting a bag of groceries above your head 3 3  5. Grooming your hair 3 4  6. Pushing up on your hands (I.e. from bathtub or chair) 2 1  7. Preparing food (I.e. peeling/cutting) 4 4  8. Driving  3 4  9. Vacuuming, sweeping, or raking 3 3  10. Dressing  3 4  11. Doing up buttons 4 4  12. Using tools/appliances 4 4  13. Opening doors 4 4  14. Cleaning  4 3  15. Tying or lacing shoes 4 4  16. Sleeping  1 3  17. Laundering clothes (I.e. washing, ironing, folding) 4 4  18. Opening a jar 4 4  19. Throwing a ball 4 3  20. Carrying a small suitcase with your affected limb.  2 3   Score total:  63 66     COGNITION: Overall cognitive status: Within functional limits for tasks assessed     SENSATION: Not tested  POSTURE: Mild rounded shoulders,   CERVICAL ROM: * into R UT  Active ROM A/PROM (deg) eval  Flexion Full *  Extension 25*  Right lateral flexion 19 *  Left lateral flexion 14 relief  Right rotation 52  Left rotation 45   (Blank rows = not tested)   UPPER EXTREMITY ROM: Full B shoulder ROM, Relief with functional IR behind back   UPPER EXTREMITY MMT:  MMT Right eval Left eval Right 07/06/24  Shoulder flexion 4+  5  Shoulder extension 4+  4+  Shoulder abduction 4 mild discomfort  4+  Shoulder adduction     Shoulder internal rotation 5    Shoulder external rotation 5    Middle trapezius 4 5 4+  Lower trapezius 4- 5 4  (Blank rows = not tested)  SHOULDER SPECIAL TESTS: Impingement tests: Neer impingement test: negative and Hawkins/Kennedy impingement test: negative Rotator cuff assessment: Empty can test: positive  and Full can test: negative  scaption weak  CERVICAL SPECIAL TESTS: Neg compression/distraction, negative spurlings B  PALPATION:  Increased tension and TTP of R UT and levator scap. Pain with UPA mobs to lower cervical and upper thoracic spine  Hip Asessment: 06/02/24 Imaging: IMPRESSION: 1. Mild bilateral femoroacetabular osteoarthritis. 2. Left greater than right lumbosacral assimilation joints with associated moderate degenerative changes. This can be a source of lower back and/or hip pain.  PAIN:  Are you having pain? No, not currently  Subjective:  The hips have been hurting for months now starting when I was walking more.   If I sit down for awhile it gets stiff.  It hurts more in the groin on both sides I also have LBP - they definitiely go together.  Objective:  Lumbar:  AROM  Flexion: hands to floor Extension: WNL and feels good Rot B: WNL Sidebend WNL  Hip AROM: WNL Hip PROM: painfree, some  pull with cross body position MMT: all 4+/5 without pain Palpation: +1-2 ttp to Rt lateral hip at greater trochanter, +1 to left hip Tightness in quads bil, bil lateral hip Lt>Rt Feels good with lumbar extension standing and prone  Posture: increased lumbar lordosis                                                                                                                              TREATMENT DATE:  [facility] Adult PT Treatment:                                                DATE:   08/18/24: Pt seen for aquatic therapy today.  Treatment took place in water 3.5-4.75 ft in depth at the [facility] pool. Temp of water was 91.  Pt entered/exited the pool via stairs using step to pattern with hand rail. Seated LAQ for warm up 20x Bil. *walking forward, back and side stepping in 3.6 ft UE push pull with rainbow floats 10x each: good speed increase resulting in increased current *Post pelvic tilt on wall 10x, 2 yellow float core press 10x2 -hip kicks 20x2 Bil 3 direction: ankle fins today -pink bells forward/back 20x2, added stagger stance 20x each side -Sit to stand from second step 3x10 - resisted walking forward and back using yellow bungee chord 10x2 -Underwater bicycle in horseback holding onto wall in corner 9 min  08/16/24: Pt seen for aquatic therapy today.  Treatment took place in water 3.5-4.75 ft in depth at the [facility] pool. Temp of water was 91.  Pt entered/exited the pool via stairs using step to pattern with hand rail. Seated LAQ for warm up 20x Bil. *walking forward, back and side stepping in 3.6 ft UE push pull with rainbow floats 10x each: good speed increase resulting in increased current *Post pelvic tilt on wall 10x, then added yellow float core press 15x2 -hip kicks 20x2 Bil 3 direction: ankle fins today -pink bells forward/back 20x2, added stagger stance 20x each side -Sit to stand from second step 3x10 -Trial of resisted walking forward and back  using yellow bungee chord 10x -Underwater bicycle in horseback holding onto wall in corner 9 min  08/11/24: Pt seen for aquatic therapy today.  Treatment took place in water 3.5-4.75 ft in depth at the [facility] pool. Temp of water was 91.  Pt entered/exited the pool via stairs using step to pattern with hand rail. Seated LAQ for warm up 20x Bil. *walking forward, back and side stepping in 3.6 ft UE push pull with rainbow floats 10x each *Post pelvic tilt on wall 10x, then added yellow float  core press 15x2 -hip kicks 20x Bil 3 direction: ankle fins today -pink bells forward/back 20x2 -Sit to stand from second step 3x10 -Underwater bicycle in horseback holding onto wall in corner 8 min  PATIENT EDUCATION: Education details: per today's note Person educated: Patient Education method: Programmer, multimedia, Facilities manager, and Handouts Education comprehension: verbalized understanding and returned demonstration  HOME EXERCISE PROGRAM: Access Code: [email or web address] URL: [email or web address] Date: 05/31/2024 Prepared by: Orvil Beuhring  Exercises - Standing Shoulder Horizontal Abduction with Resistance  - 1 x daily - 3 x weekly - 1-3 sets - 10 reps - Standing Shoulder Row with Anchored Resistance  - 1 x daily - 3 x weekly - 1-3 sets - 10 reps - Shoulder extension with resistance - Neutral  - 1 x daily - 3 x weekly - 1-3 sets - 10 reps - Standing Shoulder Single Arm PNF D2 Flexion with Resistance  - 1 x daily - 3 x weekly - 1-3 sets - 10 reps - Shoulder External Rotation and Scapular Retraction with Resistance  - 1 x daily - 7 x weekly - 1-3 sets - 10 reps - Doorway Pec Stretch at 60 Elevation  - 2 x daily - 7 x weekly - 1 sets - 2 reps - 30 sec hold - Seated Upper Trapezius Stretch  - 3 x daily - 7 x weekly - 1 sets - 1 reps - 30 hold - Seated Levator Scapulae Stretch  - 1 x daily - 7 x weekly - 1 sets - 1 reps - 30 hold - Cervical Retraction with Resistance  - 1 x daily - 7 x  weekly - 1 sets - 3 reps - 10 hold  Patient Education - Office Posture  Access Code: [email or web address] URL: [email or web address] Date: 06/02/2024 Prepared by: Saddie Raw  Exercises - Supine Bridge  - 1 x daily - 7 x weekly - 1-3 sets - 10 reps - 20-30 seconds hold - Supine Lower Trunk Rotation  - 1 x daily - 7 x weekly - 1 sets - 10 reps - 5-6 sec hold - Supine Butterfly Groin Stretch  - 1 x daily - 7 x weekly - 1 sets - 2-3 reps - 20-30 seconds hold - Supine Posterior Pelvic Tilt  - 1 x daily - 7 x weekly - 1-3 sets - 10 reps - 20-30 seconds hold - Supine Figure 4 Piriformis Stretch  - 1 x daily - 7 x weekly - 2 sets - 3 reps - 20 seconds hold  ASSESSMENT:  CLINICAL IMPRESSION: No issues with new exercises. We will continue to work towards improving Rt hip strength specifically eccentric use of hip flexors.  OBJECTIVE IMPAIRMENTS: decreased activity tolerance, decreased ROM, decreased strength, increased muscle spasms, impaired flexibility, impaired UE functional use, postural dysfunction, obesity, and pain.   ACTIVITY LIMITATIONS: carrying, lifting, standing, and sleeping  PARTICIPATION LIMITATIONS: meal prep, cleaning, laundry, community activity, occupation, and yard work  PERSONAL FACTORS: Time since onset of injury/illness/exacerbation and 1-2 comorbidities: B hip pain, HTN are also affecting patient's functional outcome.   REHAB POTENTIAL: Excellent  CLINICAL DECISION MAKING: Evolving/moderate complexity  EVALUATION COMPLEXITY: Moderate  GOALS: Goals reviewed with patient? Yes  SHORT TERM GOALS: Target date: 06/02/2024   Patient will be independent with initial HEP.  Baseline:  Goal status: MET 6/25  2.  Improved sleep by 50% secondary to decreased shoulder pain.  Baseline: 07/06/24 - 60% Goal status: MET  3.  Decreased shoulder pain by 30% with ADLs Baseline: 07/06/24 - 60% Goal status: MET  LONG TERM GOALS: Target date: 07/07/2024   Patient will be  independent with advanced/ongoing HEP to improve outcomes and carryover.  Baseline:  Goal status: ONGOING  2.  Patient able to carry and lift without increased shoulder pain. Baseline: 07/06/24 - 60% improved Goal status: ONGOING  3.  Patient to demonstrate functional neck ROM without pain provocation.  Baseline: 07/06/24 - pt reports still tight but no more pain Goal status: MET  4.  Patient will demonstrate improved R shoulder and scapular strength to 5/5 in able to lift and carry without difficulty.  Baseline: See above MMT chart Goal status: ONGOING  5. Increased UEFS score to 72 or better demonstrating improved functional ability.  Baseline: 63; 07/06/24 - 66 Goal status: ONGOING  6. Pt will be ind with final HEP for hip stretching, core mobility, and postural changes for the neck and low pain to maximize impact of posture on pain.  Baseline: No knowledge; 07/06/24 - Pt is independent with initial HEP for neck stretches, today progressed HEP to include cervical stability and hip flexibility along with initial core strength Goal Status: ONGOING   PLAN: PT FREQUENCY: 1-2x/week  PT DURATION: 8 weeks  PLANNED INTERVENTIONS: 02835- PT Re-evaluation, 97110-Therapeutic exercises, 97530- Therapeutic activity, 97112- Neuromuscular re-education, 97535- Self Care, 02859- Manual therapy, [id number]- Aquatic Therapy, [id number]- Electrical stimulation (unattended), 20560 (1-2 muscles), 20561 (3+ muscles)- Dry Needling, Patient/Family education, Joint mobilization, Spinal mobilization, Cryotherapy, and Moist heat  PLAN FOR NEXT SESSION: Land visit coming up. MMT RT hip assess eccentric use of Rt hip flexor/quad group.  Delon Darner, PTA 08/18/24 12:57 PM   [facility] [address] [place], [place], [address] Phone: [phone]   Fax:  [phone]

## 2024-08-23 ENCOUNTER — Ambulatory Visit: Admitting: Physical Therapy

## 2024-08-23 NOTE — Therapy (Deleted)
 OUTPATIENT PHYSICAL THERAPY UPPER AND LOWER EXTREMITY TREATMENT   Patient Name: Natasha [name] MRN: [id number] DOB:[birth year], 58 y.o., female Today's Date: 08/23/2024  END OF SESSION:             Past Medical History:  Diagnosis Date   Colon polyps    Diabetes mellitus without complication (HCC)    pre diabetes   GERD (gastroesophageal reflux disease)    Hypertension    Obesity    Past Surgical History:  Procedure Laterality Date   AUGMENTATION MAMMAPLASTY     GASTRIC BYPASS     Patient Active Problem List   Diagnosis Date Noted   Leucopenia 07/16/2024   Obesity, unspecified 07/16/2024   Right hip pain 07/16/2024   Encounter for screening 07/16/2024   Class 3 severe obesity due to excess calories with serious comorbidity and body mass index (BMI) of 45.0 to 49.9 in adult 03/05/2024   Anxiety 03/05/2024   Other insomnia 03/05/2024   COVID-19 vaccination declined 11/03/2023   Abnormal glucose 12/06/2022   Essential hypertension 12/06/2022   Gastroesophageal reflux disease without esophagitis 12/06/2022   Class 3 drug-induced obesity with serious comorbidity and body mass index (BMI) of 40.0 to 44.9 in adult (HCC) 12/06/2022    PCP: Georgina Speaks, FNP   REFERRING PROVIDER: Genelle Standing, MD   REFERRING DIAG: [phone] (ICD-10-CM) - Chronic right shoulder pain  Scapular dyskinesis program  Anterior chain mobilization  THERAPY DIAG:  Muscle weakness (generalized)  Cramp and spasm  Pain of both hip joints  Chronic right shoulder pain  Rationale for Evaluation and Treatment: Rehabilitation  ONSET DATE: many months ago  SUBJECTIVE:                                                                                                                                                                                      SUBJECTIVE STATEMENT:    Hand dominance: Right  PERTINENT HISTORY:  B hip pain, HTN  PAIN:  Are you having pain? None right  now, just muscle soreness.  PRECAUTIONS: None  RED FLAGS: None   WEIGHT BEARING RESTRICTIONS: No  FALLS:  Has patient fallen in last 6 months? No  LIVING ENVIRONMENT: Lives with: lives with their family Lives in: House/apartment Stairs: Yes: Internal: 15 steps; on right going up Has following equipment at home: None  OCCUPATION: Nurse  PLOF: Independent  PATIENT GOALS: Less pain  NEXT MD VISIT: 6 weeks  OBJECTIVE:  Note: Objective measures were completed at Evaluation unless otherwise noted.  DIAGNOSTIC FINDINGS:  Mild A/C OA  PATIENT SURVEYS :  UEFS  Extreme difficulty/unable (0), Quite a bit of difficulty (1), Moderate difficulty (2), Little  difficulty (3), No difficulty (4) Survey date:   07/06/24  Any of your usual work, household or school activities 3 3  2. Your usual hobbies, recreational/sport activities 0 1   3. Lifting a bag of groceries to waist level 4 3   4. Lifting a bag of groceries above your head 3 3  5. Grooming your hair 3 4  6. Pushing up on your hands (I.e. from bathtub or chair) 2 1  7. Preparing food (I.e. peeling/cutting) 4 4  8. Driving  3 4  9. Vacuuming, sweeping, or raking 3 3  10. Dressing  3 4  11. Doing up buttons 4 4  12. Using tools/appliances 4 4  13. Opening doors 4 4  14. Cleaning  4 3  15. Tying or lacing shoes 4 4  16. Sleeping  1 3  17. Laundering clothes (I.e. washing, ironing, folding) 4 4  18. Opening a jar 4 4  19. Throwing a ball 4 3  20. Carrying a small suitcase with your affected limb.  2 3  Score total:  63 66     COGNITION: Overall cognitive status: Within functional limits for tasks assessed     SENSATION: Not tested  POSTURE: Mild rounded shoulders,   CERVICAL ROM: * into R UT  Active ROM A/PROM (deg) eval  Flexion Full *  Extension 25*  Right lateral flexion 19 *  Left lateral flexion 14 relief  Right rotation 52  Left rotation 45   (Blank rows = not tested)   UPPER EXTREMITY ROM: Full  B shoulder ROM, Relief with functional IR behind back   UPPER EXTREMITY MMT:  MMT Right eval Left eval Right 07/06/24  Shoulder flexion 4+  5  Shoulder extension 4+  4+  Shoulder abduction 4 mild discomfort  4+  Shoulder adduction     Shoulder internal rotation 5    Shoulder external rotation 5    Middle trapezius 4 5 4+  Lower trapezius 4- 5 4  (Blank rows = not tested)  SHOULDER SPECIAL TESTS: Impingement tests: Neer impingement test: negative and Hawkins/Kennedy impingement test: negative Rotator cuff assessment: Empty can test: positive  and Full can test: negative  scaption weak  CERVICAL SPECIAL TESTS: Neg compression/distraction, negative spurlings B  PALPATION:  Increased tension and TTP of R UT and levator scap. Pain with UPA mobs to lower cervical and upper thoracic spine  Hip Asessment: 06/02/24 Imaging: IMPRESSION: 1. Mild bilateral femoroacetabular osteoarthritis. 2. Left greater than right lumbosacral assimilation joints with associated moderate degenerative changes. This can be a source of lower back and/or hip pain.  PAIN:  Are you having pain? No, not currently  Subjective:  The hips have been hurting for months now starting when I was walking more.   If I sit down for awhile it gets stiff.  It hurts more in the groin on both sides I also have LBP - they definitiely go together.   Objective:  Lumbar:  AROM  Flexion: hands to floor Extension: WNL and feels good Rot B: WNL Sidebend WNL  Hip AROM: WNL Hip PROM: painfree, some pull with cross body position MMT: all 4+/5 without pain Palpation: +1-2 ttp to Rt lateral hip at greater trochanter, +1 to left hip Tightness in quads bil, bil lateral hip Lt>Rt Feels good with lumbar extension standing and prone  Posture: increased lumbar lordosis  TREATMENT DATE:  [facility] Adult  PT Treatment:                                                DATE:   08/23/24: Pt seen for aquatic therapy today.  Treatment took place in water 3.5-4.75 ft in depth at the [facility] pool. Temp of water was 91.  Pt entered/exited the pool via stairs using step to pattern with hand rail. Seated LAQ for warm up 20x Bil. *walking forward, back and side stepping in 3.6 ft UE push pull with rainbow floats 10x each: good speed increase resulting in increased current *Post pelvic tilt on wall 10x, 2 yellow float core press 10x2 -hip kicks 20x2 Bil 3 direction: ankle fins today -pink bells forward/back 20x2, added stagger stance 20x each side -Sit to stand from second step 3x10 - resisted walking forward and back using yellow bungee chord 10x2 -Underwater bicycle in horseback holding onto wall in corner 9 min  08/18/24: Pt seen for aquatic therapy today.  Treatment took place in water 3.5-4.75 ft in depth at the [facility] pool. Temp of water was 91.  Pt entered/exited the pool via stairs using step to pattern with hand rail. Seated LAQ for warm up 20x Bil. *walking forward, back and side stepping in 3.6 ft UE push pull with rainbow floats 10x each: good speed increase resulting in increased current *Post pelvic tilt on wall 10x, 2 yellow float core press 10x2 -hip kicks 20x2 Bil 3 direction: ankle fins today -pink bells forward/back 20x2, added stagger stance 20x each side -Sit to stand from second step 3x10 - resisted walking forward and back using yellow bungee chord 10x2 -Underwater bicycle in horseback holding onto wall in corner 9 min  08/16/24: Pt seen for aquatic therapy today.  Treatment took place in water 3.5-4.75 ft in depth at the [facility] pool. Temp of water was 91.  Pt entered/exited the pool via stairs using step to pattern with hand rail. Seated LAQ for warm up 20x Bil. *walking forward, back and side stepping in 3.6 ft UE push pull with rainbow floats  10x each: good speed increase resulting in increased current *Post pelvic tilt on wall 10x, then added yellow float core press 15x2 -hip kicks 20x2 Bil 3 direction: ankle fins today -pink bells forward/back 20x2, added stagger stance 20x each side -Sit to stand from second step 3x10 -Trial of resisted walking forward and back using yellow bungee chord 10x -Underwater bicycle in horseback holding onto wall in corner 9 min   PATIENT EDUCATION: Education details: per today's note Person educated: Patient Education method: Programmer, multimedia, Facilities manager, and Handouts Education comprehension: verbalized understanding and returned demonstration  HOME EXERCISE PROGRAM: Access Code: [email or web address] URL: [email or web address] Date: 05/31/2024 Prepared by: Orvil Beuhring  Exercises - Standing Shoulder Horizontal Abduction with Resistance  - 1 x daily - 3 x weekly - 1-3 sets - 10 reps - Standing Shoulder Row with Anchored Resistance  - 1 x daily - 3 x weekly - 1-3 sets - 10 reps - Shoulder extension with resistance - Neutral  - 1 x daily - 3 x weekly - 1-3 sets - 10 reps - Standing Shoulder Single Arm PNF D2 Flexion with Resistance  - 1 x daily - 3 x weekly - 1-3 sets - 10 reps - Shoulder External Rotation and Scapular Retraction with  Resistance  - 1 x daily - 7 x weekly - 1-3 sets - 10 reps - Doorway Pec Stretch at 60 Elevation  - 2 x daily - 7 x weekly - 1 sets - 2 reps - 30 sec hold - Seated Upper Trapezius Stretch  - 3 x daily - 7 x weekly - 1 sets - 1 reps - 30 hold - Seated Levator Scapulae Stretch  - 1 x daily - 7 x weekly - 1 sets - 1 reps - 30 hold - Cervical Retraction with Resistance  - 1 x daily - 7 x weekly - 1 sets - 3 reps - 10 hold  Patient Education - Office Posture  Access Code: [email or web address] URL: [email or web address] Date: 06/02/2024 Prepared by: Saddie Raw  Exercises - Supine Bridge  - 1 x daily - 7 x weekly - 1-3 sets - 10 reps - 20-30 seconds hold -  Supine Lower Trunk Rotation  - 1 x daily - 7 x weekly - 1 sets - 10 reps - 5-6 sec hold - Supine Butterfly Groin Stretch  - 1 x daily - 7 x weekly - 1 sets - 2-3 reps - 20-30 seconds hold - Supine Posterior Pelvic Tilt  - 1 x daily - 7 x weekly - 1-3 sets - 10 reps - 20-30 seconds hold - Supine Figure 4 Piriformis Stretch  - 1 x daily - 7 x weekly - 2 sets - 3 reps - 20 seconds hold  ASSESSMENT:  CLINICAL IMPRESSION:    OBJECTIVE IMPAIRMENTS: decreased activity tolerance, decreased ROM, decreased strength, increased muscle spasms, impaired flexibility, impaired UE functional use, postural dysfunction, obesity, and pain.   ACTIVITY LIMITATIONS: carrying, lifting, standing, and sleeping  PARTICIPATION LIMITATIONS: meal prep, cleaning, laundry, community activity, occupation, and yard work  PERSONAL FACTORS: Time since onset of injury/illness/exacerbation and 1-2 comorbidities: B hip pain, HTN are also affecting patient's functional outcome.   REHAB POTENTIAL: Excellent  CLINICAL DECISION MAKING: Evolving/moderate complexity  EVALUATION COMPLEXITY: Moderate  GOALS: Goals reviewed with patient? Yes  SHORT TERM GOALS: Target date: 06/02/2024   Patient will be independent with initial HEP.  Baseline:  Goal status: MET 6/25  2.  Improved sleep by 50% secondary to decreased shoulder pain.  Baseline: 07/06/24 - 60% Goal status: MET  3.  Decreased shoulder pain by 30% with ADLs Baseline: 07/06/24 - 60% Goal status: MET   LONG TERM GOALS: Target date: 07/07/2024   Patient will be independent with advanced/ongoing HEP to improve outcomes and carryover.  Baseline:  Goal status: ONGOING  2.  Patient able to carry and lift without increased shoulder pain. Baseline: 07/06/24 - 60% improved Goal status: ONGOING  3.  Patient to demonstrate functional neck ROM without pain provocation.  Baseline: 07/06/24 - pt reports still tight but no more pain Goal status: MET  4.  Patient will  demonstrate improved R shoulder and scapular strength to 5/5 in able to lift and carry without difficulty.  Baseline: See above MMT chart Goal status: ONGOING  5. Increased UEFS score to 72 or better demonstrating improved functional ability.  Baseline: 63; 07/06/24 - 66 Goal status: ONGOING  6. Pt will be ind with final HEP for hip stretching, core mobility, and postural changes for the neck and low pain to maximize impact of posture on pain.  Baseline: No knowledge; 07/06/24 - Pt is independent with initial HEP for neck stretches, today progressed HEP to include cervical stability and hip flexibility along with initial core strength Goal Status: ONGOING  PLAN: PT FREQUENCY: 1-2x/week  PT DURATION: 8 weeks  PLANNED INTERVENTIONS: 97164- PT Re-evaluation, 97110-Therapeutic exercises, 97530- Therapeutic activity, 97112- Neuromuscular re-education, 97535- Self Care, [id number]- Manual therapy, [phone]- Aquatic Therapy, [id number]- Electrical stimulation (unattended), 20560 (1-2 muscles), [id number] (3+ muscles)- Dry Needling, Patient/Family education, Joint mobilization, Spinal mobilization, Cryotherapy, and Moist heat  PLAN FOR NEXT SESSION: Land visit coming up. MMT RT hip assess eccentric use of Rt hip flexor/quad group.  Delon Darner, PTA 08/23/24 8:59 AM   [facility] [address] [place], [place], [address] Phone: [phone]   Fax:  [phone]

## 2024-08-24 NOTE — Therapy (Signed)
 OUTPATIENT PHYSICAL THERAPY UPPER AND LOWER EXTREMITY TREATMENT   Patient Name: Natasha [name] MRN: [id number] DOB:12-Jan-1966, 58 y.o., female Today's Date: 08/25/2024  END OF SESSION:  PT End of Session - 08/25/24 1104     Visit Number 15    Number of Visits 22    Date for Recertification  10/06/24    Authorization Type [facility] [employer]    PT Start Time 1104    PT Stop Time 1148    PT Time Calculation (min) 44 min    Activity Tolerance Patient tolerated treatment well    Behavior During Therapy WFL for tasks assessed/performed                    Past Medical History:  Diagnosis Date   Colon polyps    Diabetes mellitus without complication (HCC)    pre diabetes   GERD (gastroesophageal reflux disease)    Hypertension    Obesity    Past Surgical History:  Procedure Laterality Date   AUGMENTATION MAMMAPLASTY     GASTRIC BYPASS     Patient Active Problem List   Diagnosis Date Noted   Leucopenia 07/16/2024   Obesity, unspecified 07/16/2024   Right hip pain 07/16/2024   Encounter for screening 07/16/2024   Class 3 severe obesity due to excess calories with serious comorbidity and body mass index (BMI) of 45.0 to 49.9 in adult 03/05/2024   Anxiety 03/05/2024   Other insomnia 03/05/2024   COVID-19 vaccination declined 11/03/2023   Abnormal glucose 12/06/2022   Essential hypertension 12/06/2022   Gastroesophageal reflux disease without esophagitis 12/06/2022   Class 3 drug-induced obesity with serious comorbidity and body mass index (BMI) of 40.0 to 44.9 in adult [facility]) 12/06/2022    PCP: Georgina Speaks, FNP   REFERRING PROVIDER: Genelle Standing, MD   REFERRING DIAG: [phone] (ICD-10-CM) - Chronic right shoulder pain  Scapular dyskinesis program  Anterior chain mobilization  THERAPY DIAG:  Muscle weakness (generalized)  Cramp and spasm  Pain of both hip joints  Chronic right shoulder pain  Cervicalgia  Rationale for Evaluation and Treatment:  Rehabilitation  ONSET DATE: many months ago  SUBJECTIVE:                                                                                                                                                                                      SUBJECTIVE STATEMENT:  I feel like I'm getting stronger. I am no longer using a cane. My goal is to get back to walking. Declines is when I feel it in my hips (R greater than left). The shoulder is improving. I use support in the car and have  set up my desk ergonomically correct.   Hand dominance: Right  PERTINENT HISTORY:  B hip pain, HTN  PAIN:  Are you having pain? None right now, just muscle soreness.  PRECAUTIONS: None  RED FLAGS: None   WEIGHT BEARING RESTRICTIONS: No  FALLS:  Has patient fallen in last 6 months? No  LIVING ENVIRONMENT: Lives with: lives with their family Lives in: House/apartment Stairs: Yes: Internal: 15 steps; on right going up Has following equipment at home: None  OCCUPATION: Nurse  PLOF: Independent  PATIENT GOALS: Less pain  NEXT MD VISIT: 6 weeks  OBJECTIVE:  Note: Objective measures were completed at Evaluation unless otherwise noted.  DIAGNOSTIC FINDINGS:  Mild A/C OA  PATIENT SURVEYS :  UEFS  Extreme difficulty/unable (0), Quite a bit of difficulty (1), Moderate difficulty (2), Little difficulty (3), No difficulty (4) Survey date:   07/06/24 9/19  Any of your usual work, household or school activities 3 3 4   2. Your usual hobbies, recreational/sport activities 0 1 1   3. Lifting a bag of groceries to waist level 4 3 4    4. Lifting a bag of groceries above your head 3 3 4   5. Grooming your hair 3 4 4   6. Pushing up on your hands (I.e. from bathtub or chair) 2 1 1   7. Preparing food (I.e. peeling/cutting) 4 4 4   8. Driving  3 4 3   9. Vacuuming, sweeping, or raking 3 3 3   10. Dressing  3 4 4   11. Doing up buttons 4 4 4   12. Using tools/appliances 4 4 4   13. Opening doors 4 4 4   14.  Cleaning  4 3 4   15. Tying or lacing shoes 4 4 4   16. Sleeping  1 3 3   17. Laundering clothes (I.e. washing, ironing, folding) 4 4 4   18. Opening a jar 4 4 4   19. Throwing a ball 4 3 3   20. Carrying a small suitcase with your affected limb.  2 3 4   Score total:  63 66 70     COGNITION: Overall cognitive status: Within functional limits for tasks assessed     SENSATION: Not tested  POSTURE: Mild rounded shoulders,   CERVICAL ROM: * into R UT  Active ROM A/PROM (deg) eval  Flexion Full *  Extension 25*  Right lateral flexion 19 *  Left lateral flexion 14 relief  Right rotation 52  Left rotation 45   (Blank rows = not tested)   UPPER EXTREMITY ROM: Full B shoulder ROM, Relief with functional IR behind back   UPPER EXTREMITY MMT:  MMT Right eval Left eval Right 07/06/24 Right 9/18  Shoulder flexion 4+  5 5  Shoulder extension 4+  4+ 4+  Shoulder abduction 4 mild discomfort  4+ 5  Shoulder adduction      Shoulder internal rotation 5   5  Shoulder external rotation 5   5  Middle trapezius 4 5 4+ 5  Lower trapezius 4- 5 4 4+  (Blank rows = not tested)   SHOULDER SPECIAL TESTS: Impingement tests: Neer impingement test: negative and Hawkins/Kennedy impingement test: negative Rotator cuff assessment: Empty can test: positive  and Full can test: negative  scaption weak  CERVICAL SPECIAL TESTS: Neg compression/distraction, negative spurlings B  PALPATION:  Increased tension and TTP of R UT and levator scap. Pain with UPA mobs to lower cervical and upper thoracic spine  Hip Asessment: 06/02/24 Imaging: IMPRESSION: 1. Mild bilateral femoroacetabular osteoarthritis. 2. Left greater  than right lumbosacral assimilation joints with associated moderate degenerative changes. This can be a source of lower back and/or hip pain.  PAIN:  Are you having pain? No, not currently  Subjective:  The hips have been hurting for months now starting when I was walking more.   If  I sit down for awhile it gets stiff.  It hurts more in the groin on both sides I also have LBP - they definitiely go together.   Objective:  Lumbar:  AROM  Flexion: hands to floor Extension: WNL and feels good Rot B: WNL Sidebend WNL  Hip AROM: WNL Hip PROM: painfree, some pull with cross body position MMT: all 4+/5 without pain Palpation: +1-2 ttp to Rt lateral hip at greater trochanter, +1 to left hip Tightness in quads bil, bil lateral hip Lt>Rt Feels good with lumbar extension standing and prone  Posture: increased lumbar lordosis   LOWER EXTREMITY MMT:  08/25/24  4+ to 5/5 in B hips (flexors weaker)                                                                                                                               TREATMENT DATE:  [facility] Adult PT Treatment:                                                DATE:   08/25/24 Nustep L5 x 5 min with PT present to discuss status Hip flexor stretch on stairs B x 30 sec Hip adductor stretch done at stairs, seated and in 1/2 kneel position on table x 30 sec ea Supine 90/90 hip flexor strengthening with yellow band around feet 2x10 B Eccentric B hip flexor strengthening in thomas test position UEFS completed MMT shoulder Goals assessed   08/23/24: Pt seen for aquatic therapy today.  Treatment took place in water 3.5-4.75 ft in depth at the [facility] pool. Temp of water was 91.  Pt entered/exited the pool via stairs using step to pattern with hand rail. Seated LAQ for warm up 20x Bil. *walking forward, back and side stepping in 3.6 ft UE push pull with rainbow floats 10x each: good speed increase resulting in increased current *Post pelvic tilt on wall 10x, 2 yellow float core press 10x2 -hip kicks 20x2 Bil 3 direction: ankle fins today -pink bells forward/back 20x2, added stagger stance 20x each side -Sit to stand from second step 3x10 - resisted walking forward and back using yellow bungee chord 10x2 -Underwater  bicycle in horseback holding onto wall in corner 9 min  08/18/24: Pt seen for aquatic therapy today.  Treatment took place in water 3.5-4.75 ft in depth at the [facility] pool. Temp of water was 91.  Pt entered/exited the pool via stairs using step to pattern with hand rail. Seated LAQ for warm  up 20x Bil. *walking forward, back and side stepping in 3.6 ft UE push pull with rainbow floats 10x each: good speed increase resulting in increased current *Post pelvic tilt on wall 10x, 2 yellow float core press 10x2 -hip kicks 20x2 Bil 3 direction: ankle fins today -pink bells forward/back 20x2, added stagger stance 20x each side -Sit to stand from second step 3x10 - resisted walking forward and back using yellow bungee chord 10x2 -Underwater bicycle in horseback holding onto wall in corner 9 min  08/16/24: Pt seen for aquatic therapy today.  Treatment took place in water 3.5-4.75 ft in depth at the [facility] pool. Temp of water was 91.  Pt entered/exited the pool via stairs using step to pattern with hand rail. Seated LAQ for warm up 20x Bil. *walking forward, back and side stepping in 3.6 ft UE push pull with rainbow floats 10x each: good speed increase resulting in increased current *Post pelvic tilt on wall 10x, then added yellow float core press 15x2 -hip kicks 20x2 Bil 3 direction: ankle fins today -pink bells forward/back 20x2, added stagger stance 20x each side -Sit to stand from second step 3x10 -Trial of resisted walking forward and back using yellow bungee chord 10x -Underwater bicycle in horseback holding onto wall in corner 9 min   PATIENT EDUCATION: Education details: per today's note Person educated: Patient Education method: Programmer, multimedia, Facilities manager, and Handouts Education comprehension: verbalized understanding and returned demonstration  HOME EXERCISE PROGRAM: Access Code: [email or web address] URL: [email or web address] Date: 05/31/2024 Prepared by:  Orvil Beuhring  Exercises - Standing Shoulder Horizontal Abduction with Resistance  - 1 x daily - 3 x weekly - 1-3 sets - 10 reps - Standing Shoulder Row with Anchored Resistance  - 1 x daily - 3 x weekly - 1-3 sets - 10 reps - Shoulder extension with resistance - Neutral  - 1 x daily - 3 x weekly - 1-3 sets - 10 reps - Standing Shoulder Single Arm PNF D2 Flexion with Resistance  - 1 x daily - 3 x weekly - 1-3 sets - 10 reps - Shoulder External Rotation and Scapular Retraction with Resistance  - 1 x daily - 7 x weekly - 1-3 sets - 10 reps - Doorway Pec Stretch at 60 Elevation  - 2 x daily - 7 x weekly - 1 sets - 2 reps - 30 sec hold - Seated Upper Trapezius Stretch  - 3 x daily - 7 x weekly - 1 sets - 1 reps - 30 hold - Seated Levator Scapulae Stretch  - 1 x daily - 7 x weekly - 1 sets - 1 reps - 30 hold - Cervical Retraction with Resistance  - 1 x daily - 7 x weekly - 1 sets - 3 reps - 10 hold  Patient Education - Office Posture  Access Code: [email or web address] URL: [email or web address] Date: 06/02/2024 Prepared by: Saddie Raw  Exercises - Supine Bridge  - 1 x daily - 7 x weekly - 1-3 sets - 10 reps - 20-30 seconds hold - Supine Lower Trunk Rotation  - 1 x daily - 7 x weekly - 1 sets - 10 reps - 5-6 sec hold - Supine Butterfly Groin Stretch  - 1 x daily - 7 x weekly - 1 sets - 2-3 reps - 20-30 seconds hold - Supine Posterior Pelvic Tilt  - 1 x daily - 7 x weekly - 1-3 sets - 10 reps - 20-30 seconds hold - Supine Figure 4 Piriformis Stretch  - 1 x daily -  7 x weekly - 2 sets - 3 reps - 20 seconds hold  ASSESSMENT:  CLINICAL IMPRESSION: Patient presents today reporting overall improvements with hip pain. She no longer uses a cane, but still experiences intermittent pain or a catch upon standing from sitting and with walking especially on declines. She demonstrates weakness in her hip flexors eccentrically and also weakness in her abdominals. This leads to some back pain with hip  flexor exercises if pt does not engage her abs. She also has tightness in her hip ADDuctors which may be contributing to pain in the anterior hip joints. She will benefit from ongoing skilled PT to address these deficits. She has made signficant gains in the pool and so I recommend she continue with 6 more aquatic visits and then 1-2 f/u land visits to finalize her HEP. She is pleased with her current level of function in her shoulder and agrees to discharge of this joint. She has met or partially met her goals relating to her neck and shoulder.    OBJECTIVE IMPAIRMENTS: decreased activity tolerance, decreased ROM, decreased strength, increased muscle spasms, impaired flexibility, impaired UE functional use, postural dysfunction, obesity, and pain.   ACTIVITY LIMITATIONS: carrying, lifting, standing, and sleeping  PARTICIPATION LIMITATIONS: meal prep, cleaning, laundry, community activity, occupation, and yard work  PERSONAL FACTORS: Time since onset of injury/illness/exacerbation and 1-2 comorbidities: B hip pain, HTN are also affecting patient's functional outcome.   REHAB POTENTIAL: Excellent  CLINICAL DECISION MAKING: Evolving/moderate complexity  EVALUATION COMPLEXITY: Moderate  GOALS: Goals reviewed with patient? Yes  SHORT TERM GOALS: Target date: 06/02/2024   Patient will be independent with initial HEP.  Baseline:  Goal status: MET 6/25  2.  Improved sleep by 50% secondary to decreased shoulder pain.  Baseline: 07/06/24 - 60% Goal status: MET  3.  Decreased shoulder pain by 30% with ADLs Baseline: 07/06/24 - 60% Goal status: MET   LONG TERM GOALS: Target date: 08/31/2024   Patient will be independent with advanced/ongoing HEP to improve outcomes and carryover.  Baseline:  Goal status: MET for shoulder; IN PROGRESS for hip and core 9/19  2.  Patient able to carry and lift without increased shoulder pain. Baseline:  Goal status: MET 08/25/24  3.  Patient to demonstrate  functional neck ROM without pain provocation.  Baseline: 07/06/24 - pt reports still tight but no more pain Goal status: MET  4.  Patient will demonstrate improved R shoulder and scapular strength to 5/5 in able to lift and carry without difficulty.  Baseline: See above MMT chart Goal status: PARTIALLY MET 08/25/24  5. Increased UEFS score to 72 or better demonstrating improved functional ability.  Baseline: 63; 07/06/24 - 66 Goal status: NOT MET  08/25/24 70  6. Pt will be ind with final HEP for hip stretching, core mobility, and postural changes for the neck and low back pain to maximize impact of posture on pain.  Baseline: No knowledge; 07/06/24 - Pt is independent with initial HEP for neck stretches, today progressed HEP to include cervical stability and hip flexibility along with initial core strength Goal Status: MET  for neck; IN PROGRESS for LE/core 08/25/24  7. Patient able to tolerate walking for 30 min without needing to stop due to hip pain.   Baseline:   Goal status: NEW  8.  Patient will report decreased catch and pain in the hip by 50% with sit to walk transitions. Baseline:  Goal status: NEW   PLAN: PT FREQUENCY: 1-2 x wk  PT DURATION: 6-7 sessions  PLANNED INTERVENTIONS: 97164- PT Re-evaluation, 97110-Therapeutic exercises, 97530- Therapeutic activity, 97112- Neuromuscular re-education, 97535- Self Care, [id number]- Manual therapy, [phone]- Aquatic Therapy, [phone]- Electrical stimulation (unattended), 20560 (1-2 muscles), 20561 (3+ muscles)- Dry Needling, Patient/Family education, Joint mobilization, Spinal mobilization, Cryotherapy, and Moist heat  PLAN FOR NEXT SESSION: shoulder has been discharged, continue with eccentric hip flexor strengthening and abdominal strenghening with goal of returning to walking program.   Mliss Cummins, PT  08/25/24 12:17 PM   [facility] [address] [place], [place], [address] Phone:  [phone]   Fax:  [phone]

## 2024-08-25 ENCOUNTER — Encounter: Payer: Self-pay | Admitting: Physical Therapy

## 2024-08-25 ENCOUNTER — Ambulatory Visit: Admitting: Physical Therapy

## 2024-08-25 ENCOUNTER — Other Ambulatory Visit (HOSPITAL_BASED_OUTPATIENT_CLINIC_OR_DEPARTMENT_OTHER): Payer: Self-pay

## 2024-08-25 ENCOUNTER — Other Ambulatory Visit (HOSPITAL_COMMUNITY): Payer: Self-pay

## 2024-08-25 DIAGNOSIS — G8929 Other chronic pain: Secondary | ICD-10-CM | POA: Diagnosis not present

## 2024-08-25 DIAGNOSIS — M542 Cervicalgia: Secondary | ICD-10-CM | POA: Diagnosis not present

## 2024-08-25 DIAGNOSIS — M6281 Muscle weakness (generalized): Secondary | ICD-10-CM

## 2024-08-25 DIAGNOSIS — M25551 Pain in right hip: Secondary | ICD-10-CM

## 2024-08-25 DIAGNOSIS — M25552 Pain in left hip: Secondary | ICD-10-CM | POA: Diagnosis not present

## 2024-08-25 DIAGNOSIS — R252 Cramp and spasm: Secondary | ICD-10-CM

## 2024-08-25 DIAGNOSIS — M25511 Pain in right shoulder: Secondary | ICD-10-CM | POA: Diagnosis not present

## 2024-08-27 ENCOUNTER — Other Ambulatory Visit (HOSPITAL_COMMUNITY): Payer: Self-pay

## 2024-08-30 ENCOUNTER — Ambulatory Visit: Admitting: Physical Therapy

## 2024-08-30 ENCOUNTER — Encounter: Payer: Self-pay | Admitting: Physical Therapy

## 2024-08-30 DIAGNOSIS — G8929 Other chronic pain: Secondary | ICD-10-CM | POA: Diagnosis not present

## 2024-08-30 DIAGNOSIS — M25552 Pain in left hip: Secondary | ICD-10-CM

## 2024-08-30 DIAGNOSIS — R252 Cramp and spasm: Secondary | ICD-10-CM | POA: Diagnosis not present

## 2024-08-30 DIAGNOSIS — M6281 Muscle weakness (generalized): Secondary | ICD-10-CM

## 2024-08-30 DIAGNOSIS — M25511 Pain in right shoulder: Secondary | ICD-10-CM | POA: Diagnosis not present

## 2024-08-30 DIAGNOSIS — M542 Cervicalgia: Secondary | ICD-10-CM | POA: Diagnosis not present

## 2024-08-30 DIAGNOSIS — M25551 Pain in right hip: Secondary | ICD-10-CM | POA: Diagnosis not present

## 2024-08-30 NOTE — Therapy (Signed)
 OUTPATIENT PHYSICAL THERAPY UPPER AND LOWER EXTREMITY TREATMENT   Patient Name: Natasha [name] MRN: [id number] DOB:17-Mar-1966, 58 y.o., female Today's Date: 08/30/2024  END OF SESSION:  PT End of Session - 08/30/24 1011     Visit Number 16    Number of Visits 22    Date for Recertification  10/06/24    Authorization Type [facility] [employer]    PT Start Time 1010    PT Stop Time 1057    PT Time Calculation (min) 47 min    Activity Tolerance Patient tolerated treatment well    Behavior During Therapy WFL for tasks assessed/performed                     Past Medical History:  Diagnosis Date   Colon polyps    Diabetes mellitus without complication (HCC)    pre diabetes   GERD (gastroesophageal reflux disease)    Hypertension    Obesity    Past Surgical History:  Procedure Laterality Date   AUGMENTATION MAMMAPLASTY     GASTRIC BYPASS     Patient Active Problem List   Diagnosis Date Noted   Leucopenia 07/16/2024   Obesity, unspecified 07/16/2024   Right hip pain 07/16/2024   Encounter for screening 07/16/2024   Class 3 severe obesity due to excess calories with serious comorbidity and body mass index (BMI) of 45.0 to 49.9 in adult 03/05/2024   Anxiety 03/05/2024   Other insomnia 03/05/2024   COVID-19 vaccination declined 11/03/2023   Abnormal glucose 12/06/2022   Essential hypertension 12/06/2022   Gastroesophageal reflux disease without esophagitis 12/06/2022   Class 3 drug-induced obesity with serious comorbidity and body mass index (BMI) of 40.0 to 44.9 in adult [facility]) 12/06/2022    PCP: Georgina Speaks, FNP   REFERRING PROVIDER: Genelle Standing, MD   REFERRING DIAG: [phone] (ICD-10-CM) - Chronic right shoulder pain  Scapular dyskinesis program  Anterior chain mobilization  THERAPY DIAG:  Muscle weakness (generalized)  Cramp and spasm  Pain of both hip joints  Chronic right shoulder pain  Rationale for Evaluation and Treatment:  Rehabilitation  ONSET DATE: many months ago  SUBJECTIVE:                                                                                                                                                                                      SUBJECTIVE STATEMENT: Tummy much better. Ready to exercise.   Hand dominance: Right  PERTINENT HISTORY:  B hip pain, HTN  PAIN:  Are you having pain? None right now, just muscle soreness.  PRECAUTIONS: None  RED FLAGS: None   WEIGHT BEARING RESTRICTIONS: No  FALLS:  Has patient fallen in last 6 months? No  LIVING ENVIRONMENT: Lives with: lives with their family Lives in: House/apartment Stairs: Yes: Internal: 15 steps; on right going up Has following equipment at home: None  OCCUPATION: Nurse  PLOF: Independent  PATIENT GOALS: Less pain  NEXT MD VISIT: 6 weeks  OBJECTIVE:  Note: Objective measures were completed at Evaluation unless otherwise noted.  DIAGNOSTIC FINDINGS:  Mild A/C OA  PATIENT SURVEYS :  UEFS  Extreme difficulty/unable (0), Quite a bit of difficulty (1), Moderate difficulty (2), Little difficulty (3), No difficulty (4) Survey date:   07/06/24 9/19  Any of your usual work, household or school activities 3 3 4   2. Your usual hobbies, recreational/sport activities 0 1 1   3. Lifting a bag of groceries to waist level 4 3 4    4. Lifting a bag of groceries above your head 3 3 4   5. Grooming your hair 3 4 4   6. Pushing up on your hands (I.e. from bathtub or chair) 2 1 1   7. Preparing food (I.e. peeling/cutting) 4 4 4   8. Driving  3 4 3   9. Vacuuming, sweeping, or raking 3 3 3   10. Dressing  3 4 4   11. Doing up buttons 4 4 4   12. Using tools/appliances 4 4 4   13. Opening doors 4 4 4   14. Cleaning  4 3 4   15. Tying or lacing shoes 4 4 4   16. Sleeping  1 3 3   17. Laundering clothes (I.e. washing, ironing, folding) 4 4 4   18. Opening a jar 4 4 4   19. Throwing a ball 4 3 3   20. Carrying a small suitcase with  your affected limb.  2 3 4   Score total:  63 66 70     COGNITION: Overall cognitive status: Within functional limits for tasks assessed     SENSATION: Not tested  POSTURE: Mild rounded shoulders,   CERVICAL ROM: * into R UT  Active ROM A/PROM (deg) eval  Flexion Full *  Extension 25*  Right lateral flexion 19 *  Left lateral flexion 14 relief  Right rotation 52  Left rotation 45   (Blank rows = not tested)   UPPER EXTREMITY ROM: Full B shoulder ROM, Relief with functional IR behind back   UPPER EXTREMITY MMT:  MMT Right eval Left eval Right 07/06/24 Right 9/18  Shoulder flexion 4+  5 5  Shoulder extension 4+  4+ 4+  Shoulder abduction 4 mild discomfort  4+ 5  Shoulder adduction      Shoulder internal rotation 5   5  Shoulder external rotation 5   5  Middle trapezius 4 5 4+ 5  Lower trapezius 4- 5 4 4+  (Blank rows = not tested)   SHOULDER SPECIAL TESTS: Impingement tests: Neer impingement test: negative and Hawkins/Kennedy impingement test: negative Rotator cuff assessment: Empty can test: positive  and Full can test: negative  scaption weak  CERVICAL SPECIAL TESTS: Neg compression/distraction, negative spurlings B  PALPATION:  Increased tension and TTP of R UT and levator scap. Pain with UPA mobs to lower cervical and upper thoracic spine  Hip Asessment: 06/02/24 Imaging: IMPRESSION: 1. Mild bilateral femoroacetabular osteoarthritis. 2. Left greater than right lumbosacral assimilation joints with associated moderate degenerative changes. This can be a source of lower back and/or hip pain.  PAIN:  Are you having pain? No, not currently  Subjective:  The hips have been hurting for months now starting when I was walking more.  If I sit down for awhile it gets stiff.  It hurts more in the groin on both sides I also have LBP - they definitiely go together.   Objective:  Lumbar:  AROM  Flexion: hands to floor Extension: WNL and feels good Rot B:  WNL Sidebend WNL  Hip AROM: WNL Hip PROM: painfree, some pull with cross body position MMT: all 4+/5 without pain Palpation: +1-2 ttp to Rt lateral hip at greater trochanter, +1 to left hip Tightness in quads bil, bil lateral hip Lt>Rt Feels good with lumbar extension standing and prone  Posture: increased lumbar lordosis   LOWER EXTREMITY MMT:  08/25/24  4+ to 5/5 in B hips (flexors weaker)                                                                                                                               TREATMENT DATE:  [facility] Adult PT Treatment:                                                DATE:   08/30/24:Pt seen for aquatic therapy today.  Treatment took place in water 3.5-4.75 ft in depth at the [facility] pool. Temp of water was 91.  Pt entered/exited the pool via stairs using step to pattern with hand rail. Seated LAQ for warm up 20x Bil. *walking forward, back and side stepping in 3.6 ft UE push pull with rainbow floats 10x each: good speed increase resulting in increased current *Post pelvic tilt on wall 10x, 2 yellow float core press 10x2 -hip kicks 20x2 Bil 3 direction: ankle fins today -pink bells forward/back 20x2, added stagger stance 20x each side -Sit to stand from second step 3x10 - resisted walking forward and back using yellow bungee chord 10x2 -L stretch/adductor stretch against wall 3x 30 sec -Underwater bicycle in horseback holding onto wall in corner 9 min   08/25/24 Nustep L5 x 5 min with PT present to discuss status Hip flexor stretch on stairs B x 30 sec Hip adductor stretch done at stairs, seated and in 1/2 kneel position on table x 30 sec ea Supine 90/90 hip flexor strengthening with yellow band around feet 2x10 B Eccentric B hip flexor strengthening in thomas test position UEFS completed MMT shoulder Goals assessed   08/23/24: Pt seen for aquatic therapy today.  Treatment took place in water 3.5-4.75 ft in depth at the [facility] pool. Temp of water was 91.  Pt entered/exited the pool via stairs using step to pattern with hand rail. Seated LAQ for warm up 20x Bil. *walking forward, back and side stepping in 3.6 ft UE push pull with rainbow floats 10x each: good speed increase resulting in increased current *Post pelvic tilt on wall 10x, 2 yellow float core press 10x2 -hip kicks 20x2  Bil 3 direction: ankle fins today -pink bells forward/back 20x2, added stagger stance 20x each side -Sit to stand from second step 3x10 - resisted walking forward and back using yellow bungee chord 10x2 -Underwater bicycle in horseback holding onto wall in corner 9 min  PATIENT EDUCATION: Education details: per today's note Person educated: Patient Education method: Programmer, multimedia, Facilities manager, and Handouts Education comprehension: verbalized understanding and returned demonstration  HOME EXERCISE PROGRAM: Access Code: [email or web address] URL: [email or web address] Date: 05/31/2024 Prepared by: Orvil Beuhring  Exercises - Standing Shoulder Horizontal Abduction with Resistance  - 1 x daily - 3 x weekly - 1-3 sets - 10 reps - Standing Shoulder Row with Anchored Resistance  - 1 x daily - 3 x weekly - 1-3 sets - 10 reps - Shoulder extension with resistance - Neutral  - 1 x daily - 3 x weekly - 1-3 sets - 10 reps - Standing Shoulder Single Arm PNF D2 Flexion with Resistance  - 1 x daily - 3 x weekly - 1-3 sets - 10 reps - Shoulder External Rotation and Scapular Retraction with Resistance  - 1 x daily - 7 x weekly - 1-3 sets - 10 reps - Doorway Pec Stretch at 60 Elevation  - 2 x daily - 7 x weekly - 1 sets - 2 reps - 30 sec hold - Seated Upper Trapezius Stretch  - 3 x daily - 7 x weekly - 1 sets - 1 reps - 30 hold - Seated Levator Scapulae Stretch  - 1 x daily - 7 x weekly - 1 sets - 1 reps - 30 hold - Cervical Retraction with Resistance  - 1 x daily - 7 x weekly - 1 sets - 3 reps - 10 hold  Patient Education - Office  Posture  Access Code: [email or web address] URL: [email or web address] Date: 06/02/2024 Prepared by: Saddie Raw  Exercises - Supine Bridge  - 1 x daily - 7 x weekly - 1-3 sets - 10 reps - 20-30 seconds hold - Supine Lower Trunk Rotation  - 1 x daily - 7 x weekly - 1 sets - 10 reps - 5-6 sec hold - Supine Butterfly Groin Stretch  - 1 x daily - 7 x weekly - 1 sets - 2-3 reps - 20-30 seconds hold - Supine Posterior Pelvic Tilt  - 1 x daily - 7 x weekly - 1-3 sets - 10 reps - 20-30 seconds hold - Supine Figure 4 Piriformis Stretch  - 1 x daily - 7 x weekly - 2 sets - 3 reps - 20 seconds hold  ASSESSMENT:  CLINICAL IMPRESSION: Pt returns to aquatic PT after having to miss a week due to GI issues. She remains optimistic for the future regarding her hip. We discussed best practice for her return to outdoor walking. Pt verbally understood to begin with flat surface and short distances, gradually increasing her duration. No issues with todays exercises. Added adductor stretching with blue noodle.   OBJECTIVE IMPAIRMENTS: decreased activity tolerance, decreased ROM, decreased strength, increased muscle spasms, impaired flexibility, impaired UE functional use, postural dysfunction, obesity, and pain.   ACTIVITY LIMITATIONS: carrying, lifting, standing, and sleeping  PARTICIPATION LIMITATIONS: meal prep, cleaning, laundry, community activity, occupation, and yard work  PERSONAL FACTORS: Time since onset of injury/illness/exacerbation and 1-2 comorbidities: B hip pain, HTN are also affecting patient's functional outcome.   REHAB POTENTIAL: Excellent  CLINICAL DECISION MAKING: Evolving/moderate complexity  EVALUATION COMPLEXITY: Moderate  GOALS: Goals reviewed with patient? Yes  SHORT TERM GOALS: Target date: 06/02/2024  Patient will be independent with initial HEP.  Baseline:  Goal status: MET 6/25  2.  Improved sleep by 50% secondary to decreased shoulder pain.  Baseline: 07/06/24 -  60% Goal status: MET  3.  Decreased shoulder pain by 30% with ADLs Baseline: 07/06/24 - 60% Goal status: MET   LONG TERM GOALS: Target date: 08/31/2024   Patient will be independent with advanced/ongoing HEP to improve outcomes and carryover.  Baseline:  Goal status: MET for shoulder; IN PROGRESS for hip and core 9/19  2.  Patient able to carry and lift without increased shoulder pain. Baseline:  Goal status: MET 08/25/24  3.  Patient to demonstrate functional neck ROM without pain provocation.  Baseline: 07/06/24 - pt reports still tight but no more pain Goal status: MET  4.  Patient will demonstrate improved R shoulder and scapular strength to 5/5 in able to lift and carry without difficulty.  Baseline: See above MMT chart Goal status: PARTIALLY MET 08/25/24  5. Increased UEFS score to 72 or better demonstrating improved functional ability.  Baseline: 63; 07/06/24 - 66 Goal status: NOT MET  08/25/24 70  6. Pt will be ind with final HEP for hip stretching, core mobility, and postural changes for the neck and low back pain to maximize impact of posture on pain.  Baseline: No knowledge; 07/06/24 - Pt is independent with initial HEP for neck stretches, today progressed HEP to include cervical stability and hip flexibility along with initial core strength Goal Status: MET  for neck; IN PROGRESS for LE/core 08/25/24  7. Patient able to tolerate walking for 30 min without needing to stop due to hip pain.   Baseline:   Goal status: NEW  8.  Patient will report decreased catch and pain in the hip by 50% with sit to walk transitions. Baseline:  Goal status: NEW   PLAN: PT FREQUENCY: 1-2 x wk  PT DURATION: 6-7 sessions  PLANNED INTERVENTIONS: 97164- PT Re-evaluation, 97110-Therapeutic exercises, 97530- Therapeutic activity, 97112- Neuromuscular re-education, 97535- Self Care, 02859- Manual therapy, [phone]- Aquatic Therapy, [phone]- Electrical stimulation (unattended), 20560 (1-2 muscles),  20561 (3+ muscles)- Dry Needling, Patient/Family education, Joint mobilization, Spinal mobilization, Cryotherapy, and Moist heat  PLAN FOR NEXT SESSION: shoulder has been discharged, continue with eccentric hip flexor strengthening, follow up with pt on her outdoor walking program.   Delon Darner, PTA 08/30/24 10:48 AM    [facility] [address] [place], [place], [address] Phone: [phone]   Fax:  [phone]

## 2024-08-31 ENCOUNTER — Ambulatory Visit: Admitting: Nurse Practitioner

## 2024-09-01 ENCOUNTER — Ambulatory Visit: Admitting: Physical Therapy

## 2024-09-01 ENCOUNTER — Other Ambulatory Visit (HOSPITAL_BASED_OUTPATIENT_CLINIC_OR_DEPARTMENT_OTHER): Payer: Self-pay

## 2024-09-01 ENCOUNTER — Encounter: Payer: Self-pay | Admitting: Physical Therapy

## 2024-09-01 DIAGNOSIS — M6281 Muscle weakness (generalized): Secondary | ICD-10-CM | POA: Diagnosis not present

## 2024-09-01 DIAGNOSIS — R252 Cramp and spasm: Secondary | ICD-10-CM | POA: Diagnosis not present

## 2024-09-01 DIAGNOSIS — M25551 Pain in right hip: Secondary | ICD-10-CM | POA: Diagnosis not present

## 2024-09-01 DIAGNOSIS — M542 Cervicalgia: Secondary | ICD-10-CM | POA: Diagnosis not present

## 2024-09-01 DIAGNOSIS — G8929 Other chronic pain: Secondary | ICD-10-CM

## 2024-09-01 DIAGNOSIS — M25552 Pain in left hip: Secondary | ICD-10-CM | POA: Diagnosis not present

## 2024-09-01 DIAGNOSIS — M25511 Pain in right shoulder: Secondary | ICD-10-CM | POA: Diagnosis not present

## 2024-09-01 NOTE — Therapy (Signed)
 OUTPATIENT PHYSICAL THERAPY UPPER AND LOWER EXTREMITY TREATMENT   Patient Name: Natasha [name] MRN: [id number] DOB:January 29, 1966, 58 y.o., female Today's Date: 09/01/2024  END OF SESSION:  PT End of Session - 09/01/24 1213     Visit Number 17    Number of Visits 22    Date for Recertification  10/06/24    Authorization Type [facility] [employer]    PT Start Time 1212    PT Stop Time 1310    PT Time Calculation (min) 58 min    Activity Tolerance Patient tolerated treatment well    Behavior During Therapy WFL for tasks assessed/performed                      Past Medical History:  Diagnosis Date   Colon polyps    Diabetes mellitus without complication (HCC)    pre diabetes   GERD (gastroesophageal reflux disease)    Hypertension    Obesity    Past Surgical History:  Procedure Laterality Date   AUGMENTATION MAMMAPLASTY     GASTRIC BYPASS     Patient Active Problem List   Diagnosis Date Noted   Leucopenia 07/16/2024   Obesity, unspecified 07/16/2024   Right hip pain 07/16/2024   Encounter for screening 07/16/2024   Class 3 severe obesity due to excess calories with serious comorbidity and body mass index (BMI) of 45.0 to 49.9 in adult 03/05/2024   Anxiety 03/05/2024   Other insomnia 03/05/2024   COVID-19 vaccination declined 11/03/2023   Abnormal glucose 12/06/2022   Essential hypertension 12/06/2022   Gastroesophageal reflux disease without esophagitis 12/06/2022   Class 3 drug-induced obesity with serious comorbidity and body mass index (BMI) of 40.0 to 44.9 in adult [facility]) 12/06/2022    PCP: Georgina Speaks, FNP   REFERRING PROVIDER: Genelle Standing, MD   REFERRING DIAG: [phone] (ICD-10-CM) - Chronic right shoulder pain  Scapular dyskinesis program  Anterior chain mobilization  THERAPY DIAG:  Muscle weakness (generalized)  Cramp and spasm  Pain of both hip joints  Chronic right shoulder pain  Rationale for Evaluation and Treatment:  Rehabilitation  ONSET DATE: many months ago  SUBJECTIVE:                                                                                                                                                                                      SUBJECTIVE STATEMENT: My return to the pool went well. Body felt good. Today I feel good, low back is a little achy.  Hand dominance: Right  PERTINENT HISTORY:  B hip pain, HTN  PAIN:  Are you having pain? None right now, just muscle soreness.  PRECAUTIONS:  None  RED FLAGS: None   WEIGHT BEARING RESTRICTIONS: No  FALLS:  Has patient fallen in last 6 months? No  LIVING ENVIRONMENT: Lives with: lives with their family Lives in: House/apartment Stairs: Yes: Internal: 15 steps; on right going up Has following equipment at home: None  OCCUPATION: Nurse  PLOF: Independent  PATIENT GOALS: Less pain  NEXT MD VISIT: 6 weeks  OBJECTIVE:  Note: Objective measures were completed at Evaluation unless otherwise noted.  DIAGNOSTIC FINDINGS:  Mild A/C OA  PATIENT SURVEYS :  UEFS  Extreme difficulty/unable (0), Quite a bit of difficulty (1), Moderate difficulty (2), Little difficulty (3), No difficulty (4) Survey date:   07/06/24 9/19  Any of your usual work, household or school activities 3 3 4   2. Your usual hobbies, recreational/sport activities 0 1 1   3. Lifting a bag of groceries to waist level 4 3 4    4. Lifting a bag of groceries above your head 3 3 4   5. Grooming your hair 3 4 4   6. Pushing up on your hands (I.e. from bathtub or chair) 2 1 1   7. Preparing food (I.e. peeling/cutting) 4 4 4   8. Driving  3 4 3   9. Vacuuming, sweeping, or raking 3 3 3   10. Dressing  3 4 4   11. Doing up buttons 4 4 4   12. Using tools/appliances 4 4 4   13. Opening doors 4 4 4   14. Cleaning  4 3 4   15. Tying or lacing shoes 4 4 4   16. Sleeping  1 3 3   17. Laundering clothes (I.e. washing, ironing, folding) 4 4 4   18. Opening a jar 4 4 4   19.  Throwing a ball 4 3 3   20. Carrying a small suitcase with your affected limb.  2 3 4   Score total:  63 66 70     COGNITION: Overall cognitive status: Within functional limits for tasks assessed     SENSATION: Not tested  POSTURE: Mild rounded shoulders,   CERVICAL ROM: * into R UT  Active ROM A/PROM (deg) eval  Flexion Full *  Extension 25*  Right lateral flexion 19 *  Left lateral flexion 14 relief  Right rotation 52  Left rotation 45   (Blank rows = not tested)   UPPER EXTREMITY ROM: Full B shoulder ROM, Relief with functional IR behind back   UPPER EXTREMITY MMT:  MMT Right eval Left eval Right 07/06/24 Right 9/18  Shoulder flexion 4+  5 5  Shoulder extension 4+  4+ 4+  Shoulder abduction 4 mild discomfort  4+ 5  Shoulder adduction      Shoulder internal rotation 5   5  Shoulder external rotation 5   5  Middle trapezius 4 5 4+ 5  Lower trapezius 4- 5 4 4+  (Blank rows = not tested)   SHOULDER SPECIAL TESTS: Impingement tests: Neer impingement test: negative and Hawkins/Kennedy impingement test: negative Rotator cuff assessment: Empty can test: positive  and Full can test: negative  scaption weak  CERVICAL SPECIAL TESTS: Neg compression/distraction, negative spurlings B  PALPATION:  Increased tension and TTP of R UT and levator scap. Pain with UPA mobs to lower cervical and upper thoracic spine  Hip Asessment: 06/02/24 Imaging: IMPRESSION: 1. Mild bilateral femoroacetabular osteoarthritis. 2. Left greater than right lumbosacral assimilation joints with associated moderate degenerative changes. This can be a source of lower back and/or hip pain.  PAIN:  Are you having pain? No, not currently  Subjective:  The hips have been hurting for months now starting when I was walking more.   If I sit down for awhile it gets stiff.  It hurts more in the groin on both sides I also have LBP - they definitiely go together.   Objective:  Lumbar:  AROM   Flexion: hands to floor Extension: WNL and feels good Rot B: WNL Sidebend WNL  Hip AROM: WNL Hip PROM: painfree, some pull with cross body position MMT: all 4+/5 without pain Palpation: +1-2 ttp to Rt lateral hip at greater trochanter, +1 to left hip Tightness in quads bil, bil lateral hip Lt>Rt Feels good with lumbar extension standing and prone  Posture: increased lumbar lordosis   LOWER EXTREMITY MMT:  08/25/24  4+ to 5/5 in B hips (flexors weaker)                                                                                                                               TREATMENT DATE:  [facility] Adult PT Treatment:                                                DATE:   09/01/24:Pt seen for aquatic therapy today.  Treatment took place in water 3.5-4.75 ft in depth at the [facility] pool. Temp of water was 91.  Pt entered/exited the pool via stairs using step to pattern with hand rail. Seated LAQ for warm up 20x Bil. *walking forward, back and side stepping in 3.6 ft UE push pull with rainbow floats 10x each: good speed increase resulting in increased current *Post pelvic tilt on wall 10x, 2 yellow float core press 20x2 -hip kicks 20x2 Bil 3 direction: ankle fins today -pink bells forward/back 20x2, stagger stance 20x each side -Sit to stand from second step 3x10 - resisted walking forward and back using yellow bungee chord 20x -L stretch/adductor stretch against wall 3x 30 sec Bil -Underwater bicycle in horseback holding onto wall in corner 9 min    08/30/24:Pt seen for aquatic therapy today.  Treatment took place in water 3.5-4.75 ft in depth at the [facility] pool. Temp of water was 91.  Pt entered/exited the pool via stairs using step to pattern with hand rail. Seated LAQ for warm up 20x Bil. *walking forward, back and side stepping in 3.6 ft UE push pull with rainbow floats 10x each: good speed increase resulting in increased current *Post pelvic tilt on  wall 10x, 2 yellow float core press 10x2 -hip kicks 20x2 Bil 3 direction: ankle fins today -pink bells forward/back 20x2, added stagger stance 20x each side -Sit to stand from second step 3x10 - resisted walking forward and back using yellow bungee chord 10x2 -L stretch/adductor stretch against wall 3x 30 sec -Underwater bicycle in horseback holding onto wall in corner 9  min   08/25/24 Nustep L5 x 5 min with PT present to discuss status Hip flexor stretch on stairs B x 30 sec Hip adductor stretch done at stairs, seated and in 1/2 kneel position on table x 30 sec ea Supine 90/90 hip flexor strengthening with yellow band around feet 2x10 B Eccentric B hip flexor strengthening in thomas test position UEFS completed MMT shoulder Goals assessed   PATIENT EDUCATION: Education details: per today's note Person educated: Patient Education method: Explanation, Demonstration, and Handouts Education comprehension: verbalized understanding and returned demonstration  HOME EXERCISE PROGRAM: Access Code: [email or web address] URL: [email or web address] Date: 05/31/2024 Prepared by: Orvil Beuhring  Exercises - Standing Shoulder Horizontal Abduction with Resistance  - 1 x daily - 3 x weekly - 1-3 sets - 10 reps - Standing Shoulder Row with Anchored Resistance  - 1 x daily - 3 x weekly - 1-3 sets - 10 reps - Shoulder extension with resistance - Neutral  - 1 x daily - 3 x weekly - 1-3 sets - 10 reps - Standing Shoulder Single Arm PNF D2 Flexion with Resistance  - 1 x daily - 3 x weekly - 1-3 sets - 10 reps - Shoulder External Rotation and Scapular Retraction with Resistance  - 1 x daily - 7 x weekly - 1-3 sets - 10 reps - Doorway Pec Stretch at 60 Elevation  - 2 x daily - 7 x weekly - 1 sets - 2 reps - 30 sec hold - Seated Upper Trapezius Stretch  - 3 x daily - 7 x weekly - 1 sets - 1 reps - 30 hold - Seated Levator Scapulae Stretch  - 1 x daily - 7 x weekly - 1 sets - 1 reps - 30 hold -  Cervical Retraction with Resistance  - 1 x daily - 7 x weekly - 1 sets - 3 reps - 10 hold  Patient Education - Office Posture  Access Code: [email or web address] URL: [email or web address] Date: 06/02/2024 Prepared by: Saddie Raw  Exercises - Supine Bridge  - 1 x daily - 7 x weekly - 1-3 sets - 10 reps - 20-30 seconds hold - Supine Lower Trunk Rotation  - 1 x daily - 7 x weekly - 1 sets - 10 reps - 5-6 sec hold - Supine Butterfly Groin Stretch  - 1 x daily - 7 x weekly - 1 sets - 2-3 reps - 20-30 seconds hold - Supine Posterior Pelvic Tilt  - 1 x daily - 7 x weekly - 1-3 sets - 10 reps - 20-30 seconds hold - Supine Figure 4 Piriformis Stretch  - 1 x daily - 7 x weekly - 2 sets - 3 reps - 20 seconds hold  ASSESSMENT:  CLINICAL IMPRESSION: Pt returns to aquatic exercise this week after missing a week due to GI issues. Her body responds well to aquatic exercise and will begin adding some fitness walking (gradually) back into her fitness routine.This will be weather dependant.  Pt tolerated all new stretches and work loads very well.    OBJECTIVE IMPAIRMENTS: decreased activity tolerance, decreased ROM, decreased strength, increased muscle spasms, impaired flexibility, impaired UE functional use, postural dysfunction, obesity, and pain.   ACTIVITY LIMITATIONS: carrying, lifting, standing, and sleeping  PARTICIPATION LIMITATIONS: meal prep, cleaning, laundry, community activity, occupation, and yard work  PERSONAL FACTORS: Time since onset of injury/illness/exacerbation and 1-2 comorbidities: B hip pain, HTN are also affecting patient's functional outcome.   REHAB POTENTIAL: Excellent  CLINICAL DECISION MAKING: Evolving/moderate complexity  EVALUATION COMPLEXITY: Moderate  GOALS: Goals reviewed with patient? Yes  SHORT TERM GOALS: Target date: 06/02/2024   Patient will be independent with initial HEP.  Baseline:  Goal status: MET 6/25  2.  Improved sleep by 50% secondary to  decreased shoulder pain.  Baseline: 07/06/24 - 60% Goal status: MET  3.  Decreased shoulder pain by 30% with ADLs Baseline: 07/06/24 - 60% Goal status: MET   LONG TERM GOALS: Target date: 08/31/2024   Patient will be independent with advanced/ongoing HEP to improve outcomes and carryover.  Baseline:  Goal status: MET for shoulder; IN PROGRESS for hip and core 9/19  2.  Patient able to carry and lift without increased shoulder pain. Baseline:  Goal status: MET 08/25/24  3.  Patient to demonstrate functional neck ROM without pain provocation.  Baseline: 07/06/24 - pt reports still tight but no more pain Goal status: MET  4.  Patient will demonstrate improved R shoulder and scapular strength to 5/5 in able to lift and carry without difficulty.  Baseline: See above MMT chart Goal status: PARTIALLY MET 08/25/24  5. Increased UEFS score to 72 or better demonstrating improved functional ability.  Baseline: 63; 07/06/24 - 66 Goal status: NOT MET  08/25/24 70  6. Pt will be ind with final HEP for hip stretching, core mobility, and postural changes for the neck and low back pain to maximize impact of posture on pain.  Baseline: No knowledge; 07/06/24 - Pt is independent with initial HEP for neck stretches, today progressed HEP to include cervical stability and hip flexibility along with initial core strength Goal Status: MET  for neck; IN PROGRESS for LE/core 08/25/24  7. Patient able to tolerate walking for 30 min without needing to stop due to hip pain.   Baseline:   Goal status: NEW  8.  Patient will report decreased catch and pain in the hip by 50% with sit to walk transitions. Baseline:  Goal status: NEW   PLAN: PT FREQUENCY: 1-2 x wk  PT DURATION: 6-7 sessions  PLANNED INTERVENTIONS: 97164- PT Re-evaluation, 97110-Therapeutic exercises, 97530- Therapeutic activity, 97112- Neuromuscular re-education, 97535- Self Care, [id number]- Manual therapy, [phone]- Aquatic Therapy, [id number]- Electrical  stimulation (unattended), [phone] (1-2 muscles), [id number] (3+ muscles)- Dry Needling, Patient/Family education, Joint mobilization, Spinal mobilization, Cryotherapy, and Moist heat  PLAN FOR NEXT SESSION: Encourage pt to begin thinking about how she will incorporate her water exercises and land exercises together upon her DC from PT.   Delon Darner, PTA 09/01/24 1:03 PM    [facility] [address] [place], [place], [address] Phone: [phone]   Fax:  [phone]

## 2024-09-02 DIAGNOSIS — F1020 Alcohol dependence, uncomplicated: Secondary | ICD-10-CM | POA: Diagnosis not present

## 2024-09-02 DIAGNOSIS — F3289 Other specified depressive episodes: Secondary | ICD-10-CM | POA: Diagnosis not present

## 2024-09-06 ENCOUNTER — Other Ambulatory Visit (HOSPITAL_COMMUNITY): Payer: Self-pay

## 2024-09-07 DIAGNOSIS — F3289 Other specified depressive episodes: Secondary | ICD-10-CM | POA: Diagnosis not present

## 2024-09-07 DIAGNOSIS — F1020 Alcohol dependence, uncomplicated: Secondary | ICD-10-CM | POA: Diagnosis not present

## 2024-09-14 DIAGNOSIS — F3289 Other specified depressive episodes: Secondary | ICD-10-CM | POA: Diagnosis not present

## 2024-09-14 DIAGNOSIS — F1020 Alcohol dependence, uncomplicated: Secondary | ICD-10-CM | POA: Diagnosis not present

## 2024-09-19 ENCOUNTER — Ambulatory Visit (HOSPITAL_BASED_OUTPATIENT_CLINIC_OR_DEPARTMENT_OTHER): Attending: Orthopaedic Surgery | Admitting: Physical Therapy

## 2024-09-19 ENCOUNTER — Encounter (HOSPITAL_BASED_OUTPATIENT_CLINIC_OR_DEPARTMENT_OTHER): Payer: Self-pay | Admitting: Physical Therapy

## 2024-09-19 DIAGNOSIS — M6281 Muscle weakness (generalized): Secondary | ICD-10-CM | POA: Insufficient documentation

## 2024-09-19 DIAGNOSIS — R252 Cramp and spasm: Secondary | ICD-10-CM | POA: Insufficient documentation

## 2024-09-19 DIAGNOSIS — G8929 Other chronic pain: Secondary | ICD-10-CM | POA: Insufficient documentation

## 2024-09-19 DIAGNOSIS — M25552 Pain in left hip: Secondary | ICD-10-CM | POA: Diagnosis not present

## 2024-09-19 DIAGNOSIS — M25511 Pain in right shoulder: Secondary | ICD-10-CM | POA: Diagnosis not present

## 2024-09-19 DIAGNOSIS — M25551 Pain in right hip: Secondary | ICD-10-CM | POA: Insufficient documentation

## 2024-09-19 NOTE — Therapy (Signed)
 OUTPATIENT PHYSICAL THERAPY UPPER AND LOWER EXTREMITY TREATMENT   Patient Name: Natasha [name] MRN: [id number] DOB:[birth year], 58 y.o., female Today's Date: 09/19/2024  END OF SESSION:  PT End of Session - 09/19/24 1407     Visit Number 18    Number of Visits 22    Date for Recertification  10/06/24    Authorization Type [facility] [employer]    PT Start Time 1402    PT Stop Time 1445    PT Time Calculation (min) 43 min    Activity Tolerance Patient tolerated treatment well    Behavior During Therapy WFL for tasks assessed/performed                      Past Medical History:  Diagnosis Date   Colon polyps    Diabetes mellitus without complication (HCC)    pre diabetes   GERD (gastroesophageal reflux disease)    Hypertension    Obesity    Past Surgical History:  Procedure Laterality Date   AUGMENTATION MAMMAPLASTY     GASTRIC BYPASS     Patient Active Problem List   Diagnosis Date Noted   Leucopenia 07/16/2024   Obesity, unspecified 07/16/2024   Right hip pain 07/16/2024   Encounter for screening 07/16/2024   Class 3 severe obesity due to excess calories with serious comorbidity and body mass index (BMI) of 45.0 to 49.9 in adult (HCC) 03/05/2024   Anxiety 03/05/2024   Other insomnia 03/05/2024   COVID-19 vaccination declined 11/03/2023   Abnormal glucose 12/06/2022   Essential hypertension 12/06/2022   Gastroesophageal reflux disease without esophagitis 12/06/2022   Class 3 drug-induced obesity with serious comorbidity and body mass index (BMI) of 40.0 to 44.9 in adult (HCC) 12/06/2022    PCP: Georgina Speaks, FNP   REFERRING PROVIDER: Genelle Standing, MD   REFERRING DIAG: [phone] (ICD-10-CM) - Chronic right shoulder pain  Scapular dyskinesis program  Anterior chain mobilization  THERAPY DIAG:  Muscle weakness (generalized)  Cramp and spasm  Pain of both hip joints  Rationale for Evaluation and Treatment: Rehabilitation  ONSET DATE: many  months ago  SUBJECTIVE:                                                                                                                                                                                      SUBJECTIVE STATEMENT: no pain in my hips or shoulder.  Just stiffness in hips after 30 minutes walk.  Hand dominance: Right  PERTINENT HISTORY:  B hip pain, HTN  PAIN:  Are you having pain? None right now, just muscle soreness.  PRECAUTIONS: None  RED FLAGS: None   WEIGHT  BEARING RESTRICTIONS: No  FALLS:  Has patient fallen in last 6 months? No  LIVING ENVIRONMENT: Lives with: lives with their family Lives in: House/apartment Stairs: Yes: Internal: 15 steps; on right going up Has following equipment at home: None  OCCUPATION: Nurse  PLOF: Independent  PATIENT GOALS: Less pain  NEXT MD VISIT: 6 weeks  OBJECTIVE:  Note: Objective measures were completed at Evaluation unless otherwise noted.  DIAGNOSTIC FINDINGS:  Mild A/C OA  PATIENT SURVEYS :  UEFS  Extreme difficulty/unable (0), Quite a bit of difficulty (1), Moderate difficulty (2), Little difficulty (3), No difficulty (4) Survey date:   07/06/24 9/19  Any of your usual work, household or school activities 3 3 4   2. Your usual hobbies, recreational/sport activities 0 1 1   3. Lifting a bag of groceries to waist level 4 3 4    4. Lifting a bag of groceries above your head 3 3 4   5. Grooming your hair 3 4 4   6. Pushing up on your hands (I.e. from bathtub or chair) 2 1 1   7. Preparing food (I.e. peeling/cutting) 4 4 4   8. Driving  3 4 3   9. Vacuuming, sweeping, or raking 3 3 3   10. Dressing  3 4 4   11. Doing up buttons 4 4 4   12. Using tools/appliances 4 4 4   13. Opening doors 4 4 4   14. Cleaning  4 3 4   15. Tying or lacing shoes 4 4 4   16. Sleeping  1 3 3   17. Laundering clothes (I.e. washing, ironing, folding) 4 4 4   18. Opening a jar 4 4 4   19. Throwing a ball 4 3 3   20. Carrying a small suitcase  with your affected limb.  2 3 4   Score total:  63 66 70     COGNITION: Overall cognitive status: Within functional limits for tasks assessed     SENSATION: Not tested  POSTURE: Mild rounded shoulders,   CERVICAL ROM: * into R UT  Active ROM A/PROM (deg) eval  Flexion Full *  Extension 25*  Right lateral flexion 19 *  Left lateral flexion 14 relief  Right rotation 52  Left rotation 45   (Blank rows = not tested)   UPPER EXTREMITY ROM: Full B shoulder ROM, Relief with functional IR behind back   UPPER EXTREMITY MMT:  MMT Right eval Left eval Right 07/06/24 Right 9/18  Shoulder flexion 4+  5 5  Shoulder extension 4+  4+ 4+  Shoulder abduction 4 mild discomfort  4+ 5  Shoulder adduction      Shoulder internal rotation 5   5  Shoulder external rotation 5   5  Middle trapezius 4 5 4+ 5  Lower trapezius 4- 5 4 4+  (Blank rows = not tested)   SHOULDER SPECIAL TESTS: Impingement tests: Neer impingement test: negative and Hawkins/Kennedy impingement test: negative Rotator cuff assessment: Empty can test: positive  and Full can test: negative  scaption weak  CERVICAL SPECIAL TESTS: Neg compression/distraction, negative spurlings B  PALPATION:  Increased tension and TTP of R UT and levator scap. Pain with UPA mobs to lower cervical and upper thoracic spine  Hip Asessment: 06/02/24 Imaging: IMPRESSION: 1. Mild bilateral femoroacetabular osteoarthritis. 2. Left greater than right lumbosacral assimilation joints with associated moderate degenerative changes. This can be a source of lower back and/or hip pain.  PAIN:  Are you having pain? No, not currently  Subjective:  The hips have been hurting for months now  starting when I was walking more.   If I sit down for awhile it gets stiff.  It hurts more in the groin on both sides I also have LBP - they definitiely go together.   Objective:  Lumbar:  AROM  Flexion: hands to floor Extension: WNL and feels  good Rot B: WNL Sidebend WNL  Hip AROM: WNL Hip PROM: painfree, some pull with cross body position MMT: all 4+/5 without pain Palpation: +1-2 ttp to Rt lateral hip at greater trochanter, +1 to left hip Tightness in quads bil, bil lateral hip Lt>Rt Feels good with lumbar extension standing and prone  Posture: increased lumbar lordosis   LOWER EXTREMITY MMT:  08/25/24  4+ to 5/5 in B hips (flexors weaker)                                                                                                                               TREATMENT DATE:  [facility] Adult PT Treatment:                                                DATE:  09/19/24:Pt seen for aquatic therapy today.  Treatment took place in water 3.5-4.75 ft in depth at the [facility] pool. Temp of water was 91.  Pt entered/exited the pool via stairs using step to pattern with hand rail. Seated LAQ for warm up 20x Bil. *walking forward, back ue row motion multiple widths *side stepping ue horizontal add/abd on surface-> abd /add using yellow HB multiple widths. VC for slowed pacing *[place] *figure 4 stretch x 3 each leg 20s hold *Sit to stand from 3rd step from bottom x10; 4th step from bottom x 5 *Seated adductor set using orange buoyancy ball *STS with add set using orange BB 2 x 5 from 3rd step (good challenge) *seated on 3rd step: hip add/abd 10 slow/10 fast x 3 sets *solid Black noodle pull down submerged to mid chest (4.3 ft) wide stance then staggered stances x 10 *cycling on noodle ue support another noodle multiple widths    09/01/24:Pt seen for aquatic therapy today.  Treatment took place in water 3.5-4.75 ft in depth at the [facility] pool. Temp of water was 91.  Pt entered/exited the pool via stairs using step to pattern with hand rail. Seated LAQ for warm up 20x Bil. *walking forward, back and side stepping in 3.6 ft UE push pull with rainbow floats 10x each: good speed increase resulting in  increased current *Post pelvic tilt on wall 10x, 2 yellow float core press 20x2 -hip kicks 20x2 Bil 3 direction: ankle fins today -pink bells forward/back 20x2, stagger stance 20x each side -Sit to stand from second step 3x10 - resisted walking forward and back using yellow bungee chord 20x -L stretch/adductor stretch against wall 3x  30 sec Bil -Underwater bicycle in horseback holding onto wall in corner 9 min    08/30/24:Pt seen for aquatic therapy today.  Treatment took place in water 3.5-4.75 ft in depth at the [facility] pool. Temp of water was 91.  Pt entered/exited the pool via stairs using step to pattern with hand rail. Seated LAQ for warm up 20x Bil. *walking forward, back and side stepping in 3.6 ft UE push pull with rainbow floats 10x each: good speed increase resulting in increased current *Post pelvic tilt on wall 10x, 2 yellow float core press 10x2 -hip kicks 20x2 Bil 3 direction: ankle fins today -pink bells forward/back 20x2, added stagger stance 20x each side -Sit to stand from second step 3x10 - resisted walking forward and back using yellow bungee chord 10x2 -L stretch/adductor stretch against wall 3x 30 sec -Underwater bicycle in horseback holding onto wall in corner 9 min   08/25/24 Nustep L5 x 5 min with PT present to discuss status Hip flexor stretch on stairs B x 30 sec Hip adductor stretch done at stairs, seated and in 1/2 kneel position on table x 30 sec ea Supine 90/90 hip flexor strengthening with yellow band around feet 2x10 B Eccentric B hip flexor strengthening in thomas test position UEFS completed MMT shoulder Goals assessed   PATIENT EDUCATION: Education details: per today's note Person educated: Patient Education method: Explanation, Demonstration, and Handouts Education comprehension: verbalized understanding and returned demonstration  HOME EXERCISE PROGRAM: Access Code: [email or web address] URL: [email or web address] Date:  05/31/2024 Prepared by: Orvil Beuhring  Exercises - Standing Shoulder Horizontal Abduction with Resistance  - 1 x daily - 3 x weekly - 1-3 sets - 10 reps - Standing Shoulder Row with Anchored Resistance  - 1 x daily - 3 x weekly - 1-3 sets - 10 reps - Shoulder extension with resistance - Neutral  - 1 x daily - 3 x weekly - 1-3 sets - 10 reps - Standing Shoulder Single Arm PNF D2 Flexion with Resistance  - 1 x daily - 3 x weekly - 1-3 sets - 10 reps - Shoulder External Rotation and Scapular Retraction with Resistance  - 1 x daily - 7 x weekly - 1-3 sets - 10 reps - Doorway Pec Stretch at 60 Elevation  - 2 x daily - 7 x weekly - 1 sets - 2 reps - 30 sec hold - Seated Upper Trapezius Stretch  - 3 x daily - 7 x weekly - 1 sets - 1 reps - 30 hold - Seated Levator Scapulae Stretch  - 1 x daily - 7 x weekly - 1 sets - 1 reps - 30 hold - Cervical Retraction with Resistance  - 1 x daily - 7 x weekly - 1 sets - 3 reps - 10 hold  Patient Education - Office Posture  Access Code: [email or web address] URL: [email or web address] Date: 06/02/2024 Prepared by: Saddie Raw  Exercises - Supine Bridge  - 1 x daily - 7 x weekly - 1-3 sets - 10 reps - 20-30 seconds hold - Supine Lower Trunk Rotation  - 1 x daily - 7 x weekly - 1 sets - 10 reps - 5-6 sec hold - Supine Butterfly Groin Stretch  - 1 x daily - 7 x weekly - 1 sets - 2-3 reps - 20-30 seconds hold - Supine Posterior Pelvic Tilt  - 1 x daily - 7 x weekly - 1-3 sets - 10 reps - 20-30 seconds hold - Supine Figure 4 Piriformis Stretch  - 1  x daily - 7 x weekly - 2 sets - 3 reps - 20 seconds hold  ASSESSMENT:  CLINICAL IMPRESSION: Pt reports walking last week outside for up to 30 minutes without any hip pain nor needing to stop and rest reaching towards meeting LTG #7. Progressed LE stretching and strengthening as well as UE.  She reports no pain coming or going.  Good tolerance to session.  VC and demonstration provided with new exercises for  execution.  Good sitting balance gained engaging core cycling on noodle.    OBJECTIVE IMPAIRMENTS: decreased activity tolerance, decreased ROM, decreased strength, increased muscle spasms, impaired flexibility, impaired UE functional use, postural dysfunction, obesity, and pain.   ACTIVITY LIMITATIONS: carrying, lifting, standing, and sleeping  PARTICIPATION LIMITATIONS: meal prep, cleaning, laundry, community activity, occupation, and yard work  PERSONAL FACTORS: Time since onset of injury/illness/exacerbation and 1-2 comorbidities: B hip pain, HTN are also affecting patient's functional outcome.   REHAB POTENTIAL: Excellent  CLINICAL DECISION MAKING: Evolving/moderate complexity  EVALUATION COMPLEXITY: Moderate  GOALS: Goals reviewed with patient? Yes  SHORT TERM GOALS: Target date: 06/02/2024   Patient will be independent with initial HEP.  Baseline:  Goal status: MET 6/25  2.  Improved sleep by 50% secondary to decreased shoulder pain.  Baseline: 07/06/24 - 60% Goal status: MET  3.  Decreased shoulder pain by 30% with ADLs Baseline: 07/06/24 - 60% Goal status: MET   LONG TERM GOALS: Target date: 08/31/2024   Patient will be independent with advanced/ongoing HEP to improve outcomes and carryover.  Baseline:  Goal status: MET for shoulder; IN PROGRESS for hip and core 9/19  2.  Patient able to carry and lift without increased shoulder pain. Baseline:  Goal status: MET 08/25/24  3.  Patient to demonstrate functional neck ROM without pain provocation.  Baseline: 07/06/24 - pt reports still tight but no more pain Goal status: MET  4.  Patient will demonstrate improved R shoulder and scapular strength to 5/5 in able to lift and carry without difficulty.  Baseline: See above MMT chart Goal status: PARTIALLY MET 08/25/24  5. Increased UEFS score to 72 or better demonstrating improved functional ability.  Baseline: 63; 07/06/24 - 66 Goal status: NOT MET  08/25/24 70  6. Pt  will be ind with final HEP for hip stretching, core mobility, and postural changes for the neck and low back pain to maximize impact of posture on pain.  Baseline: No knowledge; 07/06/24 - Pt is independent with initial HEP for neck stretches, today progressed HEP to include cervical stability and hip flexibility along with initial core strength Goal Status: MET  for neck; IN PROGRESS for LE/core 08/25/24  7. Patient able to tolerate walking for 30 min without needing to stop due to hip pain.   Baseline:   Goal status: In Progress 09/19/24  8.  Patient will report decreased catch and pain in the hip by 50% with sit to walk transitions. Baseline:  Goal status: NEW   PLAN: PT FREQUENCY: 1-2 x wk  PT DURATION: 6-7 sessions  PLANNED INTERVENTIONS: 97164- PT Re-evaluation, 97110-Therapeutic exercises, 97530- Therapeutic activity, 97112- Neuromuscular re-education, 97535- Self Care, 02859- Manual therapy, [phone]- Aquatic Therapy, [id number]- Electrical stimulation (unattended), 20560 (1-2 muscles), 20561 (3+ muscles)- Dry Needling, Patient/Family education, Joint mobilization, Spinal mobilization, Cryotherapy, and Moist heat  PLAN FOR NEXT SESSION: Encourage pt to begin thinking about how she will incorporate her water exercises and land exercises together upon her DC from PT.   Kamauri Denardo Kem) Athony Coppa MPT  09/19/24 2:09 PM [facility] [address] [place], [place], [address] Phone: [phone]   Fax:  [phone]

## 2024-09-21 ENCOUNTER — Encounter (HOSPITAL_BASED_OUTPATIENT_CLINIC_OR_DEPARTMENT_OTHER): Payer: Self-pay | Admitting: Physical Therapy

## 2024-09-21 ENCOUNTER — Ambulatory Visit (HOSPITAL_BASED_OUTPATIENT_CLINIC_OR_DEPARTMENT_OTHER): Admitting: Physical Therapy

## 2024-09-21 DIAGNOSIS — F1020 Alcohol dependence, uncomplicated: Secondary | ICD-10-CM | POA: Diagnosis not present

## 2024-09-21 DIAGNOSIS — M25552 Pain in left hip: Secondary | ICD-10-CM

## 2024-09-21 DIAGNOSIS — F3289 Other specified depressive episodes: Secondary | ICD-10-CM | POA: Diagnosis not present

## 2024-09-21 DIAGNOSIS — G8929 Other chronic pain: Secondary | ICD-10-CM

## 2024-09-21 DIAGNOSIS — R252 Cramp and spasm: Secondary | ICD-10-CM | POA: Diagnosis not present

## 2024-09-21 DIAGNOSIS — M25511 Pain in right shoulder: Secondary | ICD-10-CM | POA: Diagnosis not present

## 2024-09-21 DIAGNOSIS — M6281 Muscle weakness (generalized): Secondary | ICD-10-CM | POA: Diagnosis not present

## 2024-09-21 DIAGNOSIS — M25551 Pain in right hip: Secondary | ICD-10-CM | POA: Diagnosis not present

## 2024-09-21 NOTE — Therapy (Signed)
 OUTPATIENT PHYSICAL THERAPY UPPER AND LOWER EXTREMITY TREATMENT   Patient Name: Natasha [name] MRN: [id number] DOB:March 01, 1966, 58 y.o., female Today's Date: 09/21/2024  END OF SESSION:  PT End of Session - 09/21/24 1014     Visit Number 19    Number of Visits 22    Date for Recertification  10/06/24    Authorization Type [facility] [employer]    PT Start Time 1013    PT Stop Time 1053    PT Time Calculation (min) 40 min    Activity Tolerance Patient tolerated treatment well    Behavior During Therapy WFL for tasks assessed/performed                      Past Medical History:  Diagnosis Date   Colon polyps    Diabetes mellitus without complication (HCC)    pre diabetes   GERD (gastroesophageal reflux disease)    Hypertension    Obesity    Past Surgical History:  Procedure Laterality Date   AUGMENTATION MAMMAPLASTY     GASTRIC BYPASS     Patient Active Problem List   Diagnosis Date Noted   Leucopenia 07/16/2024   Obesity, unspecified 07/16/2024   Right hip pain 07/16/2024   Encounter for screening 07/16/2024   Class 3 severe obesity due to excess calories with serious comorbidity and body mass index (BMI) of 45.0 to 49.9 in adult (HCC) 03/05/2024   Anxiety 03/05/2024   Other insomnia 03/05/2024   COVID-19 vaccination declined 11/03/2023   Abnormal glucose 12/06/2022   Essential hypertension 12/06/2022   Gastroesophageal reflux disease without esophagitis 12/06/2022   Class 3 drug-induced obesity with serious comorbidity and body mass index (BMI) of 40.0 to 44.9 in adult (HCC) 12/06/2022    PCP: Georgina Speaks, FNP   REFERRING PROVIDER: Genelle Standing, MD   REFERRING DIAG: [id number] (ICD-10-CM) - Chronic right shoulder pain  Scapular dyskinesis program  Anterior chain mobilization  THERAPY DIAG:  Muscle weakness (generalized)  Cramp and spasm  Pain of both hip joints  Chronic right shoulder pain  Rationale for Evaluation and Treatment:  Rehabilitation  ONSET DATE: many months ago  SUBJECTIVE:                                                                                                                                                                                      SUBJECTIVE STATEMENT: no pain just stiffness this morning.  I felt the burn (pointing to right thigh) that night after I saw you last. No complaints about shoulder. I think my shoulder is just about 100%  Hand dominance: Right  PERTINENT HISTORY:  B hip pain, HTN  PAIN:  Are you having pain? None right now, just muscle soreness.  PRECAUTIONS: None  RED FLAGS: None   WEIGHT BEARING RESTRICTIONS: No  FALLS:  Has patient fallen in last 6 months? No  LIVING ENVIRONMENT: Lives with: lives with their family Lives in: House/apartment Stairs: Yes: Internal: 15 steps; on right going up Has following equipment at home: None  OCCUPATION: Nurse  PLOF: Independent  PATIENT GOALS: Less pain  NEXT MD VISIT: 6 weeks  OBJECTIVE:  Note: Objective measures were completed at Evaluation unless otherwise noted.  DIAGNOSTIC FINDINGS:  Mild A/C OA  PATIENT SURVEYS :  UEFS  Extreme difficulty/unable (0), Quite a bit of difficulty (1), Moderate difficulty (2), Little difficulty (3), No difficulty (4) Survey date:   07/06/24 9/19  Any of your usual work, household or school activities 3 3 4   2. Your usual hobbies, recreational/sport activities 0 1 1   3. Lifting a bag of groceries to waist level 4 3 4    4. Lifting a bag of groceries above your head 3 3 4   5. Grooming your hair 3 4 4   6. Pushing up on your hands (I.e. from bathtub or chair) 2 1 1   7. Preparing food (I.e. peeling/cutting) 4 4 4   8. Driving  3 4 3   9. Vacuuming, sweeping, or raking 3 3 3   10. Dressing  3 4 4   11. Doing up buttons 4 4 4   12. Using tools/appliances 4 4 4   13. Opening doors 4 4 4   14. Cleaning  4 3 4   15. Tying or lacing shoes 4 4 4   16. Sleeping  1 3 3   17.  Laundering clothes (I.e. washing, ironing, folding) 4 4 4   18. Opening a jar 4 4 4   19. Throwing a ball 4 3 3   20. Carrying a small suitcase with your affected limb.  2 3 4   Score total:  63 66 70     COGNITION: Overall cognitive status: Within functional limits for tasks assessed     SENSATION: Not tested  POSTURE: Mild rounded shoulders,   CERVICAL ROM: * into R UT  Active ROM A/PROM (deg) eval  Flexion Full *  Extension 25*  Right lateral flexion 19 *  Left lateral flexion 14 relief  Right rotation 52  Left rotation 45   (Blank rows = not tested)   UPPER EXTREMITY ROM: Full B shoulder ROM, Relief with functional IR behind back   UPPER EXTREMITY MMT:  MMT Right eval Left eval Right 07/06/24 Right 9/18  Shoulder flexion 4+  5 5  Shoulder extension 4+  4+ 4+  Shoulder abduction 4 mild discomfort  4+ 5  Shoulder adduction      Shoulder internal rotation 5   5  Shoulder external rotation 5   5  Middle trapezius 4 5 4+ 5  Lower trapezius 4- 5 4 4+  (Blank rows = not tested)   SHOULDER SPECIAL TESTS: Impingement tests: Neer impingement test: negative and Hawkins/Kennedy impingement test: negative Rotator cuff assessment: Empty can test: positive  and Full can test: negative  scaption weak  CERVICAL SPECIAL TESTS: Neg compression/distraction, negative spurlings B  PALPATION:  Increased tension and TTP of R UT and levator scap. Pain with UPA mobs to lower cervical and upper thoracic spine  Hip Asessment: 06/02/24 Imaging: IMPRESSION: 1. Mild bilateral femoroacetabular osteoarthritis. 2. Left greater than right lumbosacral assimilation joints with associated moderate degenerative changes. This can be a source of lower back and/or hip  pain.  PAIN:  Are you having pain? No, not currently  Subjective:  The hips have been hurting for months now starting when I was walking more.   If I sit down for awhile it gets stiff.  It hurts more in the groin on both  sides I also have LBP - they definitiely go together.   Objective:  Lumbar:  AROM  Flexion: hands to floor Extension: WNL and feels good Rot B: WNL Sidebend WNL  Hip AROM: WNL Hip PROM: painfree, some pull with cross body position MMT: all 4+/5 without pain Palpation: +1-2 ttp to Rt lateral hip at greater trochanter, +1 to left hip Tightness in quads bil, bil lateral hip Lt>Rt Feels good with lumbar extension standing and prone  Posture: increased lumbar lordosis   LOWER EXTREMITY MMT:  08/25/24  4+ to 5/5 in B hips (flexors weaker)                                                                                                                               TREATMENT DATE:  [facility] Adult PT Treatment:                                                DATE: 09/21/24 Pt seen for aquatic therapy today.  Treatment took place in water 3.5-4.75 ft in depth at the [facility] pool. Temp of water was 91.  Pt entered/exited the pool via stairs using step to pattern with hand rail.   Exercises - Hand buoy carry: Forward and Backward; bilaterally->unilaterally  - Side lunge with hand buoys   - [employer]  - Sit to Stand Without Arm Support   - Standing Shoulder Horizontal Abduction Using Hand buoy   - [place]    - Standing Hip Circles at [employer]   - Standing Hip Abduction Adduction at [employer]  - Standing Hip Flexion Extension at [employer]  - Push-Off on Pool Wall   - Cycling on noodle/noodle wrapped under shoulders     [facility] Adult PT Treatment:                                                DATE:  09/19/24:Pt seen for aquatic therapy today.  Treatment took place in water 3.5-4.75 ft in depth at the [facility] pool. Temp of water was 91.  Pt entered/exited the pool via stairs using step to pattern with hand rail. Seated LAQ for warm up 20x Bil. *walking forward, back ue row motion multiple widths *side stepping ue horizontal add/abd on surface-> abd /add using  yellow HB multiple widths. VC for slowed pacing *[place] *figure 4 stretch  x 3 each leg 20s hold *Sit to stand from 3rd step from bottom x10; 4th step from bottom x 5 *Seated adductor set using orange buoyancy ball *STS with add set using orange BB 2 x 5 from 3rd step (good challenge) *seated on 3rd step: hip add/abd 10 slow/10 fast x 3 sets *solid Black noodle pull down submerged to mid chest (4.3 ft) wide stance then staggered stances x 10 *cycling on noodle ue support another noodle multiple widths    09/01/24:Pt seen for aquatic therapy today.  Treatment took place in water 3.5-4.75 ft in depth at the [facility] pool. Temp of water was 91.  Pt entered/exited the pool via stairs using step to pattern with hand rail. Seated LAQ for warm up 20x Bil. *walking forward, back and side stepping in 3.6 ft UE push pull with rainbow floats 10x each: good speed increase resulting in increased current *Post pelvic tilt on wall 10x, 2 yellow float core press 20x2 -hip kicks 20x2 Bil 3 direction: ankle fins today -pink bells forward/back 20x2, stagger stance 20x each side -Sit to stand from second step 3x10 - resisted walking forward and back using yellow bungee chord 20x -L stretch/adductor stretch against wall 3x 30 sec Bil -Underwater bicycle in horseback holding onto wall in corner 9 min    08/30/24:Pt seen for aquatic therapy today.  Treatment took place in water 3.5-4.75 ft in depth at the [facility] pool. Temp of water was 91.  Pt entered/exited the pool via stairs using step to pattern with hand rail. Seated LAQ for warm up 20x Bil. *walking forward, back and side stepping in 3.6 ft UE push pull with rainbow floats 10x each: good speed increase resulting in increased current *Post pelvic tilt on wall 10x, 2 yellow float core press 10x2 -hip kicks 20x2 Bil 3 direction: ankle fins today -pink bells forward/back 20x2, added stagger stance 20x each side -Sit to stand  from second step 3x10 - resisted walking forward and back using yellow bungee chord 10x2 -L stretch/adductor stretch against wall 3x 30 sec -Underwater bicycle in horseback holding onto wall in corner 9 min   08/25/24 Nustep L5 x 5 min with PT present to discuss status Hip flexor stretch on stairs B x 30 sec Hip adductor stretch done at stairs, seated and in 1/2 kneel position on table x 30 sec ea Supine 90/90 hip flexor strengthening with yellow band around feet 2x10 B Eccentric B hip flexor strengthening in thomas test position UEFS completed MMT shoulder Goals assessed   PATIENT EDUCATION: Education details: per today's note Person educated: Patient Education method: Explanation, Demonstration, and Handouts Education comprehension: verbalized understanding and returned demonstration  HOME EXERCISE PROGRAM: Access Code: [email or web address] URL: [email or web address] Date: 05/31/2024 Prepared by: Orvil Beuhring  Exercises - Standing Shoulder Horizontal Abduction with Resistance  - 1 x daily - 3 x weekly - 1-3 sets - 10 reps - Standing Shoulder Row with Anchored Resistance  - 1 x daily - 3 x weekly - 1-3 sets - 10 reps - Shoulder extension with resistance - Neutral  - 1 x daily - 3 x weekly - 1-3 sets - 10 reps - Standing Shoulder Single Arm PNF D2 Flexion with Resistance  - 1 x daily - 3 x weekly - 1-3 sets - 10 reps - Shoulder External Rotation and Scapular Retraction with Resistance  - 1 x daily - 7 x weekly - 1-3 sets - 10 reps - Doorway Pec Stretch at 60 Elevation  -  2 x daily - 7 x weekly - 1 sets - 2 reps - 30 sec hold - Seated Upper Trapezius Stretch  - 3 x daily - 7 x weekly - 1 sets - 1 reps - 30 hold - Seated Levator Scapulae Stretch  - 1 x daily - 7 x weekly - 1 sets - 1 reps - 30 hold - Cervical Retraction with Resistance  - 1 x daily - 7 x weekly - 1 sets - 3 reps - 10 hold  Patient Education - Office Posture  Access Code: [email or web address] URL:  [email or web address] Date: 06/02/2024 Prepared by: Saddie Raw  Exercises - Supine Bridge  - 1 x daily - 7 x weekly - 1-3 sets - 10 reps - 20-30 seconds hold - Supine Lower Trunk Rotation  - 1 x daily - 7 x weekly - 1 sets - 10 reps - 5-6 sec hold - Supine Butterfly Groin Stretch  - 1 x daily - 7 x weekly - 1 sets - 2-3 reps - 20-30 seconds hold - Supine Posterior Pelvic Tilt  - 1 x daily - 7 x weekly - 1-3 sets - 10 reps - 20-30 seconds hold - Supine Figure 4 Piriformis Stretch  - 1 x daily - 7 x weekly - 2 sets - 3 reps - 20 seconds hold  Aquatic This aquatic home exercise program from [employer] utilizes pictures from land based exercises, but has been adapted prior to lamination and issuance.   Access Code: [email or web address] URL: [email or web address] Date: 09/21/2024 Prepared by: Frankie Spenser Harren  Exercises - Hand buoy carry: Forward and Backward; bilaterally->unilaterally  - 1 x daily - 1-3 x weekly - Side lunge with hand buoys  - 1 x daily - 1-3 x weekly - Noodle Press  - 1 x daily - 1-3 x weekly - 1-2 sets - 10 reps - Sit to Stand Without Arm Support  - 1 x daily - 1-3 x weekly - 1 sets - 10 reps - Standing Shoulder Horizontal Abduction Using Hand buoy  - 1 x daily - 1-3 x weekly - 1-2 sets - 10 reps - [place]   - 1 x daily - 1-3 x weekly - 1-2 sets - 10 reps - Standing Hip Circles at [employer]  - 1 x daily - 1-3 x weekly - 1-2 sets - 10 reps - Standing Hip Abduction Adduction at [employer]  - 1 x daily - 1-3 x weekly - 1-2 sets - 10 reps - Standing Hip Flexion Extension at [employer]  - 1 x daily - 1-3 x weekly - 1-2 sets - 10 reps - Push-Off on Pool Wall  - 1 x daily - 1-3 x weekly - 1-2 sets - 10 reps - Cycling on noodle/noodle wrapped under shoulders  - 1 x daily - 1-3 x weekly  ASSESSMENT:  CLINICAL IMPRESSION: Pt reports some discomfort in right hip after last aquatic session. Eliminated adductor sets and STS with adductor set to try to off set. Pt  is instructed through created aquatic HEP today.  She demonstrates good toleration and execution.  Provided VC and demonstration throughout.  Modified some exercises for comfort.  Continued cues for slowing pace.  Her standing balance is good as evidenced though multiple SLS engagements with LOB or unsteadiness.  Pt is approaching her max potential in aquatic setting and may benefit from fully transitioning onto land intervention in near future to progress strengthening and towards remaining goals. Plan to issue and complete instruction on final  HEP prior to last scheduled aquatic session (2 ).      OBJECTIVE IMPAIRMENTS: decreased activity tolerance, decreased ROM, decreased strength, increased muscle spasms, impaired flexibility, impaired UE functional use, postural dysfunction, obesity, and pain.   ACTIVITY LIMITATIONS: carrying, lifting, standing, and sleeping  PARTICIPATION LIMITATIONS: meal prep, cleaning, laundry, community activity, occupation, and yard work  PERSONAL FACTORS: Time since onset of injury/illness/exacerbation and 1-2 comorbidities: B hip pain, HTN are also affecting patient's functional outcome.   REHAB POTENTIAL: Excellent  CLINICAL DECISION MAKING: Evolving/moderate complexity  EVALUATION COMPLEXITY: Moderate  GOALS: Goals reviewed with patient? Yes  SHORT TERM GOALS: Target date: 06/02/2024   Patient will be independent with initial HEP.  Baseline:  Goal status: MET 6/25  2.  Improved sleep by 50% secondary to decreased shoulder pain.  Baseline: 07/06/24 - 60% Goal status: MET  3.  Decreased shoulder pain by 30% with ADLs Baseline: 07/06/24 - 60% Goal status: MET   LONG TERM GOALS: Target date: 08/31/2024   Patient will be independent with advanced/ongoing HEP to improve outcomes and carryover.  Baseline:  Goal status: MET for shoulder; IN PROGRESS for hip and core 9/19  2.  Patient able to carry and lift without increased shoulder pain. Baseline:   Goal status: MET 08/25/24  3.  Patient to demonstrate functional neck ROM without pain provocation.  Baseline: 07/06/24 - pt reports still tight but no more pain Goal status: MET  4.  Patient will demonstrate improved R shoulder and scapular strength to 5/5 in able to lift and carry without difficulty.  Baseline: See above MMT chart Goal status: PARTIALLY MET 08/25/24  5. Increased UEFS score to 72 or better demonstrating improved functional ability.  Baseline: 63; 07/06/24 - 66 Goal status: NOT MET  08/25/24 70  6. Pt will be ind with final HEP for hip stretching, core mobility, and postural changes for the neck and low back pain to maximize impact of posture on pain.  Baseline: No knowledge; 07/06/24 - Pt is independent with initial HEP for neck stretches, today progressed HEP to include cervical stability and hip flexibility along with initial core strength Goal Status: MET  for neck; IN PROGRESS for LE/core 08/25/24  7. Patient able to tolerate walking for 30 min without needing to stop due to hip pain.   Baseline:   Goal status: In Progress 09/19/24  8.  Patient will report decreased catch and pain in the hip by 50% with sit to walk transitions. Baseline:  Goal status: NEW   PLAN: PT FREQUENCY: 1-2 x wk  PT DURATION: 6-7 sessions  PLANNED INTERVENTIONS: 97164- PT Re-evaluation, 97110-Therapeutic exercises, 97530- Therapeutic activity, 97112- Neuromuscular re-education, 97535- Self Care, 02859- Manual therapy, [phone]- Aquatic Therapy, [id number]- Electrical stimulation (unattended), 20560 (1-2 muscles), 20561 (3+ muscles)- Dry Needling, Patient/Family education, Joint mobilization, Spinal mobilization, Cryotherapy, and Moist heat  PLAN FOR NEXT SESSION: Encourage pt to begin thinking about how she will incorporate her water exercises and land exercises together upon her DC from PT.   [address] [place]) Zoeie Ritter MPT 09/21/24 10:15 AM [facility] [address] [place], [place], [address] Phone: [phone]   Fax:  [phone]

## 2024-09-27 ENCOUNTER — Ambulatory Visit: Attending: Orthopaedic Surgery | Admitting: Physical Therapy

## 2024-09-27 ENCOUNTER — Other Ambulatory Visit (HOSPITAL_BASED_OUTPATIENT_CLINIC_OR_DEPARTMENT_OTHER): Payer: Self-pay

## 2024-09-27 ENCOUNTER — Encounter: Payer: Self-pay | Admitting: Physical Therapy

## 2024-09-27 DIAGNOSIS — R252 Cramp and spasm: Secondary | ICD-10-CM | POA: Diagnosis not present

## 2024-09-27 DIAGNOSIS — M25551 Pain in right hip: Secondary | ICD-10-CM | POA: Diagnosis not present

## 2024-09-27 DIAGNOSIS — M6281 Muscle weakness (generalized): Secondary | ICD-10-CM | POA: Diagnosis not present

## 2024-09-27 DIAGNOSIS — M542 Cervicalgia: Secondary | ICD-10-CM | POA: Diagnosis not present

## 2024-09-27 DIAGNOSIS — G8929 Other chronic pain: Secondary | ICD-10-CM | POA: Insufficient documentation

## 2024-09-27 DIAGNOSIS — M25511 Pain in right shoulder: Secondary | ICD-10-CM | POA: Diagnosis not present

## 2024-09-27 DIAGNOSIS — M25552 Pain in left hip: Secondary | ICD-10-CM | POA: Diagnosis not present

## 2024-09-27 MED ORDER — FLUZONE 0.5 ML IM SUSY
0.5000 mL | PREFILLED_SYRINGE | Freq: Once | INTRAMUSCULAR | 0 refills | Status: AC
  Filled 2024-09-27: qty 0.5, 1d supply, fill #0

## 2024-09-27 NOTE — Therapy (Signed)
 OUTPATIENT PHYSICAL THERAPY UPPER AND LOWER EXTREMITY TREATMENT   Patient Name: Natasha [name] MRN: [id number] DOB:Jul 21, 1966, 59 y.o., female Today's Date: 09/27/2024  END OF SESSION:  PT End of Session - 09/27/24 0927     Visit Number 20    Number of Visits 22    Date for Recertification  10/06/24    Authorization Type [facility] [employer]    PT Start Time [phone]    PT Stop Time 1015    PT Time Calculation (min) 48 min    Activity Tolerance Patient tolerated treatment well    Behavior During Therapy [facility] for tasks assessed/performed                       Past Medical History:  Diagnosis Date   Colon polyps    Diabetes mellitus without complication (HCC)    pre diabetes   GERD (gastroesophageal reflux disease)    Hypertension    Obesity    Past Surgical History:  Procedure Laterality Date   AUGMENTATION MAMMAPLASTY     GASTRIC BYPASS     Patient Active Problem List   Diagnosis Date Noted   Leucopenia 07/16/2024   Obesity, unspecified 07/16/2024   Right hip pain 07/16/2024   Encounter for screening 07/16/2024   Class 3 severe obesity due to excess calories with serious comorbidity and body mass index (BMI) of 45.0 to 49.9 in adult (HCC) 03/05/2024   Anxiety 03/05/2024   Other insomnia 03/05/2024   COVID-19 vaccination declined 11/03/2023   Abnormal glucose 12/06/2022   Essential hypertension 12/06/2022   Gastroesophageal reflux disease without esophagitis 12/06/2022   Class 3 drug-induced obesity with serious comorbidity and body mass index (BMI) of 40.0 to 44.9 in adult [facility]) 12/06/2022    PCP: Georgina Speaks, FNP   REFERRING PROVIDER: Genelle Standing, MD   REFERRING DIAG: [phone] (ICD-10-CM) - Chronic right shoulder pain  Scapular dyskinesis program  Anterior chain mobilization  THERAPY DIAG:  Muscle weakness (generalized)  Cramp and spasm  Chronic right shoulder pain  Pain of both hip joints  Rationale for Evaluation and Treatment:  Rehabilitation  ONSET DATE: many months ago  SUBJECTIVE:                                                                                                                                                                                      SUBJECTIVE STATEMENT: My hip is 85% improved since start of care.  Hand dominance: Right  PERTINENT HISTORY:  B hip pain, HTN  PAIN:  Are you having pain? None right now, just muscle soreness.  PRECAUTIONS: None  RED FLAGS: None   WEIGHT BEARING  RESTRICTIONS: No  FALLS:  Has patient fallen in last 6 months? No  LIVING ENVIRONMENT: Lives with: lives with their family Lives in: House/apartment Stairs: Yes: Internal: 15 steps; on right going up Has following equipment at home: None  OCCUPATION: Nurse  PLOF: Independent  PATIENT GOALS: Less pain  NEXT MD VISIT: 6 weeks  OBJECTIVE:  Note: Objective measures were completed at Evaluation unless otherwise noted.  DIAGNOSTIC FINDINGS:  Mild A/C OA  PATIENT SURVEYS :  UEFS  Extreme difficulty/unable (0), Quite a bit of difficulty (1), Moderate difficulty (2), Little difficulty (3), No difficulty (4) Survey date:   07/06/24 9/19  Any of your usual work, household or school activities 3 3 4   2. Your usual hobbies, recreational/sport activities 0 1 1   3. Lifting a bag of groceries to waist level 4 3 4    4. Lifting a bag of groceries above your head 3 3 4   5. Grooming your hair 3 4 4   6. Pushing up on your hands (I.e. from bathtub or chair) 2 1 1   7. Preparing food (I.e. peeling/cutting) 4 4 4   8. Driving  3 4 3   9. Vacuuming, sweeping, or raking 3 3 3   10. Dressing  3 4 4   11. Doing up buttons 4 4 4   12. Using tools/appliances 4 4 4   13. Opening doors 4 4 4   14. Cleaning  4 3 4   15. Tying or lacing shoes 4 4 4   16. Sleeping  1 3 3   17. Laundering clothes (I.e. washing, ironing, folding) 4 4 4   18. Opening a jar 4 4 4   19. Throwing a ball 4 3 3   20. Carrying a small suitcase  with your affected limb.  2 3 4   Score total:  63 66 70     COGNITION: Overall cognitive status: Within functional limits for tasks assessed     SENSATION: Not tested  POSTURE: Mild rounded shoulders,   CERVICAL ROM: * into R UT  Active ROM A/PROM (deg) eval  Flexion Full *  Extension 25*  Right lateral flexion 19 *  Left lateral flexion 14 relief  Right rotation 52  Left rotation 45   (Blank rows = not tested)   UPPER EXTREMITY ROM: Full B shoulder ROM, Relief with functional IR behind back   UPPER EXTREMITY MMT:  MMT Right eval Left eval Right 07/06/24 Right 9/18  Shoulder flexion 4+  5 5  Shoulder extension 4+  4+ 4+  Shoulder abduction 4 mild discomfort  4+ 5  Shoulder adduction      Shoulder internal rotation 5   5  Shoulder external rotation 5   5  Middle trapezius 4 5 4+ 5  Lower trapezius 4- 5 4 4+  (Blank rows = not tested)   SHOULDER SPECIAL TESTS: Impingement tests: Neer impingement test: negative and Hawkins/Kennedy impingement test: negative Rotator cuff assessment: Empty can test: positive  and Full can test: negative  scaption weak  CERVICAL SPECIAL TESTS: Neg compression/distraction, negative spurlings B  PALPATION:  Increased tension and TTP of R UT and levator scap. Pain with UPA mobs to lower cervical and upper thoracic spine  Hip Asessment: 06/02/24 Imaging: IMPRESSION: 1. Mild bilateral femoroacetabular osteoarthritis. 2. Left greater than right lumbosacral assimilation joints with associated moderate degenerative changes. This can be a source of lower back and/or hip pain.  PAIN:  Are you having pain? No, not currently  Subjective:  The hips have been hurting for months now starting  when I was walking more.   If I sit down for awhile it gets stiff.  It hurts more in the groin on both sides I also have LBP - they definitiely go together.   Objective:  Lumbar:  AROM  Flexion: hands to floor Extension: WNL and feels  good Rot B: WNL Sidebend WNL  Hip AROM: WNL Hip PROM: painfree, some pull with cross body position MMT: all 4+/5 without pain Palpation: +1-2 ttp to Rt lateral hip at greater trochanter, +1 to left hip Tightness in quads bil, bil lateral hip Lt>Rt Feels good with lumbar extension standing and prone  Posture: increased lumbar lordosis   LOWER EXTREMITY MMT:  08/25/24  4+ to 5/5 in B hips (flexors weaker)                                                                                                                               TREATMENT DATE:  [facility] Adult PT Treatment:                                                DATE:  09/27/24:Pt seen for aquatic therapy today.  Treatment took place in water 3.5-4.75 ft in depth at the [facility] pool. Temp of water was 91.  Pt entered/exited the pool via stairs using step to pattern with hand rail. Pt requires buoyancy of water for support and to offload joints with strengthening exercises.  Pt utilizes viscosity of the water required for strengthening.  -60% water depth water walking with VC to walk fast (like speed walking on land) 10 lengths of each with UE push/pull with rainbow floats -yellow hand floats by herside with slow, high knee march 6 lengths, VC to go slow to stay in single limb stance a bit longer -Sit to stand from 3rd step 3x10 -RT adductor stretch with blue noodle at ankle 4x 30 sec - hip 3 ways with ankle fins 25x each direction RTLE -Bicycle on horseback, black noodle for UE balance 5 min    09/21/24 Pt seen for aquatic therapy today.  Treatment took place in water 3.5-4.75 ft in depth at the [facility] pool. Temp of water was 91.  Pt entered/exited the pool via stairs using step to pattern with hand rail.   Exercises - Hand buoy carry: Forward and Backward; bilaterally->unilaterally  - Side lunge with hand buoys   - [employer]  - Sit to Stand Without Arm Support   - Standing Shoulder Horizontal  Abduction Using Hand buoy   - [place]    - Standing Hip Circles at [employer]   - Standing Hip Abduction Adduction at [employer]  - Standing Hip Flexion Extension at [employer]  - Push-Off on Pool Wall   - Cycling on noodle/noodle wrapped under shoulders  [facility] Adult PT Treatment:                                                DATE:  09/19/24:Pt seen for aquatic therapy today.  Treatment took place in water 3.5-4.75 ft in depth at the [facility] pool. Temp of water was 91.  Pt entered/exited the pool via stairs using step to pattern with hand rail. Seated LAQ for warm up 20x Bil. *walking forward, back ue row motion multiple widths *side stepping ue horizontal add/abd on surface-> abd /add using yellow HB multiple widths. VC for slowed pacing *[place] *figure 4 stretch x 3 each leg 20s hold *Sit to stand from 3rd step from bottom x10; 4th step from bottom x 5 *Seated adductor set using orange buoyancy ball *STS with add set using orange BB 2 x 5 from 3rd step (good challenge) *seated on 3rd step: hip add/abd 10 slow/10 fast x 3 sets *solid Black noodle pull down submerged to mid chest (4.3 ft) wide stance then staggered stances x 10 *cycling on noodle ue support another noodle multiple widths    09/01/24:Pt seen for aquatic therapy today.  Treatment took place in water 3.5-4.75 ft in depth at the [facility] pool. Temp of water was 91.  Pt entered/exited the pool via stairs using step to pattern with hand rail. Seated LAQ for warm up 20x Bil. *walking forward, back and side stepping in 3.6 ft UE push pull with rainbow floats 10x each: good speed increase resulting in increased current *Post pelvic tilt on wall 10x, 2 yellow float core press 20x2 -hip kicks 20x2 Bil 3 direction: ankle fins today -pink bells forward/back 20x2, stagger stance 20x each side -Sit to stand from second step 3x10 - resisted walking forward and back using yellow bungee chord  20x -L stretch/adductor stretch against wall 3x 30 sec Bil -Underwater bicycle in horseback holding onto wall in corner 9 min     PATIENT EDUCATION: Education details: per today's note Person educated: Patient Education method: Programmer, multimedia, Facilities manager, and Handouts Education comprehension: verbalized understanding and returned demonstration  HOME EXERCISE PROGRAM: Access Code: [email or web address] URL: [email or web address] Date: 05/31/2024 Prepared by: Orvil Beuhring  Exercises - Standing Shoulder Horizontal Abduction with Resistance  - 1 x daily - 3 x weekly - 1-3 sets - 10 reps - Standing Shoulder Row with Anchored Resistance  - 1 x daily - 3 x weekly - 1-3 sets - 10 reps - Shoulder extension with resistance - Neutral  - 1 x daily - 3 x weekly - 1-3 sets - 10 reps - Standing Shoulder Single Arm PNF D2 Flexion with Resistance  - 1 x daily - 3 x weekly - 1-3 sets - 10 reps - Shoulder External Rotation and Scapular Retraction with Resistance  - 1 x daily - 7 x weekly - 1-3 sets - 10 reps - Doorway Pec Stretch at 60 Elevation  - 2 x daily - 7 x weekly - 1 sets - 2 reps - 30 sec hold - Seated Upper Trapezius Stretch  - 3 x daily - 7 x weekly - 1 sets - 1 reps - 30 hold - Seated Levator Scapulae Stretch  - 1 x daily - 7 x weekly - 1 sets - 1 reps - 30 hold - Cervical Retraction with Resistance  - 1 x daily - 7  x weekly - 1 sets - 3 reps - 10 hold  Patient Education - Office Posture  Access Code: [email or web address] URL: [email or web address] Date: 06/02/2024 Prepared by: Saddie Raw  Exercises - Supine Bridge  - 1 x daily - 7 x weekly - 1-3 sets - 10 reps - 20-30 seconds hold - Supine Lower Trunk Rotation  - 1 x daily - 7 x weekly - 1 sets - 10 reps - 5-6 sec hold - Supine Butterfly Groin Stretch  - 1 x daily - 7 x weekly - 1 sets - 2-3 reps - 20-30 seconds hold - Supine Posterior Pelvic Tilt  - 1 x daily - 7 x weekly - 1-3 sets - 10 reps - 20-30 seconds hold - Supine  Figure 4 Piriformis Stretch  - 1 x daily - 7 x weekly - 2 sets - 3 reps - 20 seconds hold  Aquatic This aquatic home exercise program from Med[employer] utilizes pictures from land based exercises, but has been adapted prior to lamination and issuance.   Access Code: [email or web address] URL: [email or web address] Date: 09/21/2024 Prepared by: Frankie Ziemba  Exercises - Hand buoy carry: Forward and Backward; bilaterally->unilaterally  - 1 x daily - 1-3 x weekly - Side lunge with hand buoys  - 1 x daily - 1-3 x weekly - Noodle Press  - 1 x daily - 1-3 x weekly - 1-2 sets - 10 reps - Sit to Stand Without Arm Support  - 1 x daily - 1-3 x weekly - 1 sets - 10 reps - Standing Shoulder Horizontal Abduction Using Hand buoy  - 1 x daily - 1-3 x weekly - 1-2 sets - 10 reps - [place]   - 1 x daily - 1-3 x weekly - 1-2 sets - 10 reps - Standing Hip Circles at [employer]  - 1 x daily - 1-3 x weekly - 1-2 sets - 10 reps - Standing Hip Abduction Adduction at [employer]  - 1 x daily - 1-3 x weekly - 1-2 sets - 10 reps - Standing Hip Flexion Extension at [employer]  - 1 x daily - 1-3 x weekly - 1-2 sets - 10 reps - Push-Off on Pool Wall  - 1 x daily - 1-3 x weekly - 1-2 sets - 10 reps - Cycling on noodle/noodle wrapped under shoulders  - 1 x daily - 1-3 x weekly  ASSESSMENT:  CLINICAL IMPRESSION: Pt reports she is now walking 30 min at the park on flat ground successfully and pain free. She has not decided what to do about pool exercise post PT but she has not ruled it out as it does make exercising her hip easier.      OBJECTIVE IMPAIRMENTS: decreased activity tolerance, decreased ROM, decreased strength, increased muscle spasms, impaired flexibility, impaired UE functional use, postural dysfunction, obesity, and pain.   ACTIVITY LIMITATIONS: carrying, lifting, standing, and sleeping  PARTICIPATION LIMITATIONS: meal prep, cleaning, laundry, community activity, occupation, and yard  work  PERSONAL FACTORS: Time since onset of injury/illness/exacerbation and 1-2 comorbidities: B hip pain, HTN are also affecting patient's functional outcome.   REHAB POTENTIAL: Excellent  CLINICAL DECISION MAKING: Evolving/moderate complexity  EVALUATION COMPLEXITY: Moderate  GOALS: Goals reviewed with patient? Yes  SHORT TERM GOALS: Target date: 06/02/2024   Patient will be independent with initial HEP.  Baseline:  Goal status: MET 6/25  2.  Improved sleep by 50% secondary to decreased shoulder pain.  Baseline: 07/06/24 - 60% Goal status: MET  3.  Decreased  shoulder pain by 30% with ADLs Baseline: 07/06/24 - 60% Goal status: MET   LONG TERM GOALS: Target date: 08/31/2024   Patient will be independent with advanced/ongoing HEP to improve outcomes and carryover.  Baseline:  Goal status: MET for shoulder; IN PROGRESS for hip and core 9/19  2.  Patient able to carry and lift without increased shoulder pain. Baseline:  Goal status: MET 08/25/24  3.  Patient to demonstrate functional neck ROM without pain provocation.  Baseline: 07/06/24 - pt reports still tight but no more pain Goal status: MET  4.  Patient will demonstrate improved R shoulder and scapular strength to 5/5 in able to lift and carry without difficulty.  Baseline: See above MMT chart Goal status: PARTIALLY MET 08/25/24  5. Increased UEFS score to 72 or better demonstrating improved functional ability.  Baseline: 63; 07/06/24 - 66 Goal status: NOT MET  08/25/24 70  6. Pt will be ind with final HEP for hip stretching, core mobility, and postural changes for the neck and low back pain to maximize impact of posture on pain.  Baseline: No knowledge; 07/06/24 - Pt is independent with initial HEP for neck stretches, today progressed HEP to include cervical stability and hip flexibility along with initial core strength Goal Status: MET  for neck; IN PROGRESS for LE/core 08/25/24  7. Patient able to tolerate walking for  30 min without needing to stop due to hip pain.   Baseline:   Goal status: In Progress 09/19/24  8.  Patient will report decreased catch and pain in the hip by 50% with sit to walk transitions. Baseline:  Goal status: NEW   PLAN: PT FREQUENCY: 1-2 x wk  PT DURATION: 6-7 sessions  PLANNED INTERVENTIONS: 97164- PT Re-evaluation, 97110-Therapeutic exercises, 97530- Therapeutic activity, 97112- Neuromuscular re-education, 97535- Self Care, [id number]- Manual therapy, [phone]- Aquatic Therapy, [id number]- Electrical stimulation (unattended), 20560 (1-2 muscles), 20561 (3+ muscles)- Dry Needling, Patient/Family education, Joint mobilization, Spinal mobilization, Cryotherapy, and Moist heat  PLAN FOR NEXT SESSION: 2 more visits then probable DC  Delon Darner, PTA 09/27/24 10:51 AM   [facility] [address] [place], [place], [address] Phone: [phone]   Fax:  [phone]

## 2024-09-29 ENCOUNTER — Encounter (HOSPITAL_BASED_OUTPATIENT_CLINIC_OR_DEPARTMENT_OTHER): Payer: Self-pay | Admitting: Physical Therapy

## 2024-09-29 ENCOUNTER — Ambulatory Visit (HOSPITAL_BASED_OUTPATIENT_CLINIC_OR_DEPARTMENT_OTHER): Admitting: Physical Therapy

## 2024-09-29 DIAGNOSIS — G8929 Other chronic pain: Secondary | ICD-10-CM | POA: Diagnosis not present

## 2024-09-29 DIAGNOSIS — M6281 Muscle weakness (generalized): Secondary | ICD-10-CM | POA: Diagnosis not present

## 2024-09-29 DIAGNOSIS — M25551 Pain in right hip: Secondary | ICD-10-CM

## 2024-09-29 DIAGNOSIS — M25552 Pain in left hip: Secondary | ICD-10-CM | POA: Diagnosis not present

## 2024-09-29 DIAGNOSIS — R252 Cramp and spasm: Secondary | ICD-10-CM

## 2024-09-29 DIAGNOSIS — M25511 Pain in right shoulder: Secondary | ICD-10-CM | POA: Diagnosis not present

## 2024-09-29 NOTE — Therapy (Signed)
 OUTPATIENT PHYSICAL THERAPY UPPER AND LOWER EXTREMITY TREATMENT   Patient Name: Natasha [name] MRN: [id number] DOB:07/12/1966, 58 y.o., female Today's Date: 09/29/2024  END OF SESSION:  PT End of Session - 09/29/24 0957     Visit Number 21    Number of Visits 22    Date for Recertification  10/06/24    Authorization Type [facility] [employer]    PT Start Time 0930    PT Stop Time 1015    PT Time Calculation (min) 45 min    Activity Tolerance Patient tolerated treatment well    Behavior During Therapy [facility] for tasks assessed/performed                       Past Medical History:  Diagnosis Date   Colon polyps    Diabetes mellitus without complication (HCC)    pre diabetes   GERD (gastroesophageal reflux disease)    Hypertension    Obesity    Past Surgical History:  Procedure Laterality Date   AUGMENTATION MAMMAPLASTY     GASTRIC BYPASS     Patient Active Problem List   Diagnosis Date Noted   Leucopenia 07/16/2024   Obesity, unspecified 07/16/2024   Right hip pain 07/16/2024   Encounter for screening 07/16/2024   Class 3 severe obesity due to excess calories with serious comorbidity and body mass index (BMI) of 45.0 to 49.9 in adult (HCC) 03/05/2024   Anxiety 03/05/2024   Other insomnia 03/05/2024   COVID-19 vaccination declined 11/03/2023   Abnormal glucose 12/06/2022   Essential hypertension 12/06/2022   Gastroesophageal reflux disease without esophagitis 12/06/2022   Class 3 drug-induced obesity with serious comorbidity and body mass index (BMI) of 40.0 to 44.9 in adult [facility]) 12/06/2022    PCP: Georgina Speaks, FNP   REFERRING PROVIDER: Genelle Standing, MD   REFERRING DIAG: [id number] (ICD-10-CM) - Chronic right shoulder pain  Scapular dyskinesis program  Anterior chain mobilization  THERAPY DIAG:  Muscle weakness (generalized)  Cramp and spasm  Pain of both hip joints  Rationale for Evaluation and Treatment: Rehabilitation  ONSET DATE: many  months ago  SUBJECTIVE:                                                                                                                                                                                      SUBJECTIVE STATEMENT: Am checking out the [facility] and [facility] today  Hand dominance: Right  PERTINENT HISTORY:  B hip pain, HTN  PAIN:  Are you having pain? None right now, just muscle soreness.  PRECAUTIONS: None  RED FLAGS: None   WEIGHT BEARING RESTRICTIONS: No  FALLS:  Has  patient fallen in last 6 months? No  LIVING ENVIRONMENT: Lives with: lives with their family Lives in: House/apartment Stairs: Yes: Internal: 15 steps; on right going up Has following equipment at home: None  OCCUPATION: Nurse  PLOF: Independent  PATIENT GOALS: Less pain  NEXT MD VISIT: 6 weeks  OBJECTIVE:  Note: Objective measures were completed at Evaluation unless otherwise noted.  DIAGNOSTIC FINDINGS:  Mild A/C OA  PATIENT SURVEYS :  UEFS  Extreme difficulty/unable (0), Quite a bit of difficulty (1), Moderate difficulty (2), Little difficulty (3), No difficulty (4) Survey date:   07/06/24 9/19  Any of your usual work, household or school activities 3 3 4   2. Your usual hobbies, recreational/sport activities 0 1 1   3. Lifting a bag of groceries to waist level 4 3 4    4. Lifting a bag of groceries above your head 3 3 4   5. Grooming your hair 3 4 4   6. Pushing up on your hands (I.e. from bathtub or chair) 2 1 1   7. Preparing food (I.e. peeling/cutting) 4 4 4   8. Driving  3 4 3   9. Vacuuming, sweeping, or raking 3 3 3   10. Dressing  3 4 4   11. Doing up buttons 4 4 4   12. Using tools/appliances 4 4 4   13. Opening doors 4 4 4   14. Cleaning  4 3 4   15. Tying or lacing shoes 4 4 4   16. Sleeping  1 3 3   17. Laundering clothes (I.e. washing, ironing, folding) 4 4 4   18. Opening a jar 4 4 4   19. Throwing a ball 4 3 3   20. Carrying a small suitcase with your affected limb.  2 3 4   Score  total:  63 66 70     COGNITION: Overall cognitive status: Within functional limits for tasks assessed     SENSATION: Not tested  POSTURE: Mild rounded shoulders,   CERVICAL ROM: * into R UT  Active ROM A/PROM (deg) eval  Flexion Full *  Extension 25*  Right lateral flexion 19 *  Left lateral flexion 14 relief  Right rotation 52  Left rotation 45   (Blank rows = not tested)   UPPER EXTREMITY ROM: Full B shoulder ROM, Relief with functional IR behind back   UPPER EXTREMITY MMT:  MMT Right eval Left eval Right 07/06/24 Right 9/18  Shoulder flexion 4+  5 5  Shoulder extension 4+  4+ 4+  Shoulder abduction 4 mild discomfort  4+ 5  Shoulder adduction      Shoulder internal rotation 5   5  Shoulder external rotation 5   5  Middle trapezius 4 5 4+ 5  Lower trapezius 4- 5 4 4+  (Blank rows = not tested)   SHOULDER SPECIAL TESTS: Impingement tests: Neer impingement test: negative and Hawkins/Kennedy impingement test: negative Rotator cuff assessment: Empty can test: positive  and Full can test: negative  scaption weak  CERVICAL SPECIAL TESTS: Neg compression/distraction, negative spurlings B  PALPATION:  Increased tension and TTP of R UT and levator scap. Pain with UPA mobs to lower cervical and upper thoracic spine  Hip Asessment: 06/02/24 Imaging: IMPRESSION: 1. Mild bilateral femoroacetabular osteoarthritis. 2. Left greater than right lumbosacral assimilation joints with associated moderate degenerative changes. This can be a source of lower back and/or hip pain.  PAIN:  Are you having pain? No, not currently  Subjective:  The hips have been hurting for months now starting when I was walking more.  If I sit down for awhile it gets stiff.  It hurts more in the groin on both sides I also have LBP - they definitiely go together.   Objective:  Lumbar:  AROM  Flexion: hands to floor Extension: WNL and feels good Rot B: WNL Sidebend WNL  Hip AROM:  WNL Hip PROM: painfree, some pull with cross body position MMT: all 4+/5 without pain Palpation: +1-2 ttp to Rt lateral hip at greater trochanter, +1 to left hip Tightness in quads bil, bil lateral hip Lt>Rt Feels good with lumbar extension standing and prone  Posture: increased lumbar lordosis   LOWER EXTREMITY MMT:  08/25/24  4+ to 5/5 in B hips (flexors weaker)                                                                                                                               TREATMENT DATE:  09/29/24 Pt seen for aquatic therapy today.  Treatment took place in water 3.5-4.75 ft in depth at the [facility] pool. Temp of water was 91.  Pt entered/exited the pool via stairs using step to pattern with hand rail.   Instruction and clarifications given with completion. Exercises - Hand buoy carry: Forward and Backward; bilaterally->unilaterally  - Side lunge with hand buoys   - [employer]  - Sit to Stand Without Arm Support   - Standing Shoulder Horizontal Abduction Using Hand buoy   - [place]    - Standing Hip Circles at [employer]   - Standing Hip Abduction Adduction at [employer]  - Standing Hip Flexion Extension at [employer]  - Push-Off on Pool Wall   - Cycling on noodle/noodle wrapped under shoulders    [facility] Adult PT Treatment:                                                DATE:  09/27/24:Pt seen for aquatic therapy today.  Treatment took place in water 3.5-4.75 ft in depth at the [facility] pool. Temp of water was 91.  Pt entered/exited the pool via stairs using step to pattern with hand rail. Pt requires buoyancy of water for support and to offload joints with strengthening exercises.  Pt utilizes viscosity of the water required for strengthening.  -60% water depth water walking with VC to walk fast (like speed walking on land) 10 lengths of each with UE push/pull with rainbow floats -yellow hand floats by herside with slow, high knee march 6  lengths, VC to go slow to stay in single limb stance a bit longer -Sit to stand from 3rd step 3x10 -RT adductor stretch with blue noodle at ankle 4x 30 sec - hip 3 ways with ankle fins 25x each direction RTLE -Bicycle on horseback, black noodle for UE balance 5 min  09/21/24 Pt seen for aquatic therapy today.  Treatment took place in water 3.5-4.75 ft in depth at the [facility] pool. Temp of water was 91.  Pt entered/exited the pool via stairs using step to pattern with hand rail.   Exercises - Hand buoy carry: Forward and Backward; bilaterally->unilaterally  - Side lunge with hand buoys   - [employer]  - Sit to Stand Without Arm Support   - Standing Shoulder Horizontal Abduction Using Hand buoy   - [place]    - Standing Hip Circles at [employer]   - Standing Hip Abduction Adduction at [employer]  - Standing Hip Flexion Extension at [employer]  - Push-Off on Pool Wall   - Cycling on noodle/noodle wrapped under shoulders     [facility] Adult PT Treatment:                                                DATE:  09/19/24:Pt seen for aquatic therapy today.  Treatment took place in water 3.5-4.75 ft in depth at the [facility] pool. Temp of water was 91.  Pt entered/exited the pool via stairs using step to pattern with hand rail. Seated LAQ for warm up 20x Bil. *walking forward, back ue row motion multiple widths *side stepping ue horizontal add/abd on surface-> abd /add using yellow HB multiple widths. VC for slowed pacing *[place] *figure 4 stretch x 3 each leg 20s hold *Sit to stand from 3rd step from bottom x10; 4th step from bottom x 5 *Seated adductor set using orange buoyancy ball *STS with add set using orange BB 2 x 5 from 3rd step (good challenge) *seated on 3rd step: hip add/abd 10 slow/10 fast x 3 sets *solid Black noodle pull down submerged to mid chest (4.3 ft) wide stance then staggered stances x 10 *cycling on noodle ue support another noodle  multiple widths    09/01/24:Pt seen for aquatic therapy today.  Treatment took place in water 3.5-4.75 ft in depth at the [facility] pool. Temp of water was 91.  Pt entered/exited the pool via stairs using step to pattern with hand rail. Seated LAQ for warm up 20x Bil. *walking forward, back and side stepping in 3.6 ft UE push pull with rainbow floats 10x each: good speed increase resulting in increased current *Post pelvic tilt on wall 10x, 2 yellow float core press 20x2 -hip kicks 20x2 Bil 3 direction: ankle fins today -pink bells forward/back 20x2, stagger stance 20x each side -Sit to stand from second step 3x10 - resisted walking forward and back using yellow bungee chord 20x -L stretch/adductor stretch against wall 3x 30 sec Bil -Underwater bicycle in horseback holding onto wall in corner 9 min     PATIENT EDUCATION: Education details: per today's note Person educated: Patient Education method: Programmer, multimedia, Facilities manager, and Handouts Education comprehension: verbalized understanding and returned demonstration  HOME EXERCISE PROGRAM: Access Code: [email or web address] URL: [email or web address] Date: 05/31/2024 Prepared by: Orvil Beuhring  Exercises - Standing Shoulder Horizontal Abduction with Resistance  - 1 x daily - 3 x weekly - 1-3 sets - 10 reps - Standing Shoulder Row with Anchored Resistance  - 1 x daily - 3 x weekly - 1-3 sets - 10 reps - Shoulder extension with resistance - Neutral  - 1 x daily - 3 x weekly - 1-3 sets -  10 reps - Standing Shoulder Single Arm PNF D2 Flexion with Resistance  - 1 x daily - 3 x weekly - 1-3 sets - 10 reps - Shoulder External Rotation and Scapular Retraction with Resistance  - 1 x daily - 7 x weekly - 1-3 sets - 10 reps - Doorway Pec Stretch at 60 Elevation  - 2 x daily - 7 x weekly - 1 sets - 2 reps - 30 sec hold - Seated Upper Trapezius Stretch  - 3 x daily - 7 x weekly - 1 sets - 1 reps - 30 hold - Seated Levator  Scapulae Stretch  - 1 x daily - 7 x weekly - 1 sets - 1 reps - 30 hold - Cervical Retraction with Resistance  - 1 x daily - 7 x weekly - 1 sets - 3 reps - 10 hold  Patient Education - Office Posture  Access Code: [email or web address] URL: [email or web address] Date: 06/02/2024 Prepared by: Saddie Raw  Exercises - Supine Bridge  - 1 x daily - 7 x weekly - 1-3 sets - 10 reps - 20-30 seconds hold - Supine Lower Trunk Rotation  - 1 x daily - 7 x weekly - 1 sets - 10 reps - 5-6 sec hold - Supine Butterfly Groin Stretch  - 1 x daily - 7 x weekly - 1 sets - 2-3 reps - 20-30 seconds hold - Supine Posterior Pelvic Tilt  - 1 x daily - 7 x weekly - 1-3 sets - 10 reps - 20-30 seconds hold - Supine Figure 4 Piriformis Stretch  - 1 x daily - 7 x weekly - 2 sets - 3 reps - 20 seconds hold  Aquatic This aquatic home exercise program from [employer] utilizes pictures from land based exercises, but has been adapted prior to lamination and issuance.   Access Code: [email or web address] URL: [email or web address] Date: 09/21/2024 Prepared by: Frankie Lonzell Dorris  Exercises - Hand buoy carry: Forward and Backward; bilaterally->unilaterally  - 1 x daily - 1-3 x weekly - Side lunge with hand buoys  - 1 x daily - 1-3 x weekly - Noodle Press  - 1 x daily - 1-3 x weekly - 1-2 sets - 10 reps - Sit to Stand Without Arm Support  - 1 x daily - 1-3 x weekly - 1 sets - 10 reps - Standing Shoulder Horizontal Abduction Using Hand buoy  - 1 x daily - 1-3 x weekly - 1-2 sets - 10 reps - [place]   - 1 x daily - 1-3 x weekly - 1-2 sets - 10 reps - Standing Hip Circles at [employer]  - 1 x daily - 1-3 x weekly - 1-2 sets - 10 reps - Standing Hip Abduction Adduction at [employer]  - 1 x daily - 1-3 x weekly - 1-2 sets - 10 reps - Standing Hip Flexion Extension at [employer]  - 1 x daily - 1-3 x weekly - 1-2 sets - 10 reps - Push-Off on Pool Wall  - 1 x daily - 1-3 x weekly - 1-2 sets - 10 reps - Cycling on  noodle/noodle wrapped under shoulders  - 1 x daily - 1-3 x weekly  ASSESSMENT:  CLINICAL IMPRESSION: Pt arrives for final aquatic session.  She is issued and instructed on final aquatic program.  VC, demonstration and written clarifications given to ensure understanding, completion and various techniques to increase/decrease challenges. She demonstrates understanding and execution. Pt has reached her max potential in aquatic setting and is ready  to fully transition to land intervention to progress towards remaining goals. Potential DC likely/asper land based therapist     OBJECTIVE IMPAIRMENTS: decreased activity tolerance, decreased ROM, decreased strength, increased muscle spasms, impaired flexibility, impaired UE functional use, postural dysfunction, obesity, and pain.   ACTIVITY LIMITATIONS: carrying, lifting, standing, and sleeping  PARTICIPATION LIMITATIONS: meal prep, cleaning, laundry, community activity, occupation, and yard work  PERSONAL FACTORS: Time since onset of injury/illness/exacerbation and 1-2 comorbidities: B hip pain, HTN are also affecting patient's functional outcome.   REHAB POTENTIAL: Excellent  CLINICAL DECISION MAKING: Evolving/moderate complexity  EVALUATION COMPLEXITY: Moderate  GOALS: Goals reviewed with patient? Yes  SHORT TERM GOALS: Target date: 06/02/2024   Patient will be independent with initial HEP.  Baseline:  Goal status: MET 6/25  2.  Improved sleep by 50% secondary to decreased shoulder pain.  Baseline: 07/06/24 - 60% Goal status: MET  3.  Decreased shoulder pain by 30% with ADLs Baseline: 07/06/24 - 60% Goal status: MET   LONG TERM GOALS: Target date: 08/31/2024   Patient will be independent with advanced/ongoing HEP to improve outcomes and carryover.  Baseline:  Goal status: MET for shoulder; IN PROGRESS for hip and core 9/19  2.  Patient able to carry and lift without increased shoulder pain. Baseline:  Goal status: MET  08/25/24  3.  Patient to demonstrate functional neck ROM without pain provocation.  Baseline: 07/06/24 - pt reports still tight but no more pain Goal status: MET  4.  Patient will demonstrate improved R shoulder and scapular strength to 5/5 in able to lift and carry without difficulty.  Baseline: See above MMT chart Goal status: PARTIALLY MET 08/25/24  5. Increased UEFS score to 72 or better demonstrating improved functional ability.  Baseline: 63; 07/06/24 - 66 Goal status: NOT MET  08/25/24 70  6. Pt will be ind with final HEP for hip stretching, core mobility, and postural changes for the neck and low back pain to maximize impact of posture on pain.  Baseline: No knowledge; 07/06/24 - Pt is independent with initial HEP for neck stretches, today progressed HEP to include cervical stability and hip flexibility along with initial core strength Goal Status: MET  for neck; IN PROGRESS for LE/core 08/25/24  7. Patient able to tolerate walking for 30 min without needing to stop due to hip pain.   Baseline:   Goal status: In Progress 09/19/24  8.  Patient will report decreased catch and pain in the hip by 50% with sit to walk transitions. Baseline:  Goal status: NEW   PLAN: PT FREQUENCY: 1-2 x wk  PT DURATION: 6-7 sessions  PLANNED INTERVENTIONS: 97164- PT Re-evaluation, 97110-Therapeutic exercises, 97530- Therapeutic activity, 97112- Neuromuscular re-education, 97535- Self Care, 02859- Manual therapy, [phone]- Aquatic Therapy, [id number]- Electrical stimulation (unattended), 20560 (1-2 muscles), 20561 (3+ muscles)- Dry Needling, Patient/Family education, Joint mobilization, Spinal mobilization, Cryotherapy, and Moist heat  PLAN FOR NEXT SESSION: Return to land with possible DC.  Aquatics complete  Ronal Foots) Terry Bolotin MPT 09/29/24 9:59 AM [facility] [address] [place], [place], [address] Phone: [phone]   Fax:   [phone]

## 2024-09-30 DIAGNOSIS — F1020 Alcohol dependence, uncomplicated: Secondary | ICD-10-CM | POA: Diagnosis not present

## 2024-09-30 DIAGNOSIS — F3289 Other specified depressive episodes: Secondary | ICD-10-CM | POA: Diagnosis not present

## 2024-10-03 NOTE — Therapy (Signed)
 OUTPATIENT PHYSICAL THERAPY UPPER AND LOWER EXTREMITY TREATMENT   Patient Name: Natasha [name] MRN: [id number] DOB:Jun 23, 1966, 58 y.o., female Today's Date: 10/03/2024  END OF SESSION:                 Past Medical History:  Diagnosis Date   Colon polyps    Diabetes mellitus without complication (HCC)    pre diabetes   GERD (gastroesophageal reflux disease)    Hypertension    Obesity    Past Surgical History:  Procedure Laterality Date   AUGMENTATION MAMMAPLASTY     GASTRIC BYPASS     Patient Active Problem List   Diagnosis Date Noted   Leucopenia 07/16/2024   Obesity, unspecified 07/16/2024   Right hip pain 07/16/2024   Encounter for screening 07/16/2024   Class 3 severe obesity due to excess calories with serious comorbidity and body mass index (BMI) of 45.0 to 49.9 in adult (HCC) 03/05/2024   Anxiety 03/05/2024   Other insomnia 03/05/2024   COVID-19 vaccination declined 11/03/2023   Abnormal glucose 12/06/2022   Essential hypertension 12/06/2022   Gastroesophageal reflux disease without esophagitis 12/06/2022   Class 3 drug-induced obesity with serious comorbidity and body mass index (BMI) of 40.0 to 44.9 in adult [facility]) 12/06/2022    PCP: Georgina Speaks, FNP   REFERRING PROVIDER: Genelle Standing, MD   REFERRING DIAG: [phone] (ICD-10-CM) - Chronic right shoulder pain  Scapular dyskinesis program  Anterior chain mobilization  THERAPY DIAG:  No diagnosis found.  Rationale for Evaluation and Treatment: Rehabilitation  ONSET DATE: many months ago  SUBJECTIVE:                                                                                                                                                                                      SUBJECTIVE STATEMENT: Am checking out the [facility] and [facility] today  Hand dominance: Right  PERTINENT HISTORY:  B hip pain, HTN  PAIN:  Are you having pain? None right now, just muscle  soreness.  PRECAUTIONS: None  RED FLAGS: None   WEIGHT BEARING RESTRICTIONS: No  FALLS:  Has patient fallen in last 6 months? No  LIVING ENVIRONMENT: Lives with: lives with their family Lives in: House/apartment Stairs: Yes: Internal: 15 steps; on right going up Has following equipment at home: None  OCCUPATION: Nurse  PLOF: Independent  PATIENT GOALS: Less pain  NEXT MD VISIT: 6 weeks  OBJECTIVE:  Note: Objective measures were completed at Evaluation unless otherwise noted.  DIAGNOSTIC FINDINGS:  Mild A/C OA  PATIENT SURVEYS :  UEFS  Extreme difficulty/unable (0), Quite a bit of difficulty (1), Moderate difficulty (2), Little difficulty (3), No difficulty (  4) Survey date:   07/06/24 9/19  Any of your usual work, household or school activities 3 3 4   2. Your usual hobbies, recreational/sport activities 0 1 1   3. Lifting a bag of groceries to waist level 4 3 4    4. Lifting a bag of groceries above your head 3 3 4   5. Grooming your hair 3 4 4   6. Pushing up on your hands (I.e. from bathtub or chair) 2 1 1   7. Preparing food (I.e. peeling/cutting) 4 4 4   8. Driving  3 4 3   9. Vacuuming, sweeping, or raking 3 3 3   10. Dressing  3 4 4   11. Doing up buttons 4 4 4   12. Using tools/appliances 4 4 4   13. Opening doors 4 4 4   14. Cleaning  4 3 4   15. Tying or lacing shoes 4 4 4   16. Sleeping  1 3 3   17. Laundering clothes (I.e. washing, ironing, folding) 4 4 4   18. Opening a jar 4 4 4   19. Throwing a ball 4 3 3   20. Carrying a small suitcase with your affected limb.  2 3 4   Score total:  63 66 70     COGNITION: Overall cognitive status: Within functional limits for tasks assessed     SENSATION: Not tested  POSTURE: Mild rounded shoulders,   CERVICAL ROM: * into R UT  Active ROM A/PROM (deg) eval  Flexion Full *  Extension 25*  Right lateral flexion 19 *  Left lateral flexion 14 relief  Right rotation 52  Left rotation 45   (Blank rows = not tested)    UPPER EXTREMITY ROM: Full B shoulder ROM, Relief with functional IR behind back   UPPER EXTREMITY MMT:  MMT Right eval Left eval Right 07/06/24 Right 9/18 Right 10/29  Shoulder flexion 4+  5 5   Shoulder extension 4+  4+ 4+   Shoulder abduction 4 mild discomfort  4+ 5   Shoulder adduction       Shoulder internal rotation 5   5   Shoulder external rotation 5   5   Middle trapezius 4 5 4+ 5   Lower trapezius 4- 5 4 4+   (Blank rows = not tested)   SHOULDER SPECIAL TESTS: Impingement tests: Neer impingement test: negative and Hawkins/Kennedy impingement test: negative Rotator cuff assessment: Empty can test: positive  and Full can test: negative  scaption weak  CERVICAL SPECIAL TESTS: Neg compression/distraction, negative spurlings B  PALPATION:  Increased tension and TTP of R UT and levator scap. Pain with UPA mobs to lower cervical and upper thoracic spine  Hip Asessment: 06/02/24 Imaging: IMPRESSION: 1. Mild bilateral femoroacetabular osteoarthritis. 2. Left greater than right lumbosacral assimilation joints with associated moderate degenerative changes. This can be a source of lower back and/or hip pain.  PAIN:  Are you having pain? No, not currently  Subjective:  The hips have been hurting for months now starting when I was walking more.   If I sit down for awhile it gets stiff.  It hurts more in the groin on both sides I also have LBP - they definitiely go together.   Objective:  Lumbar:  AROM  Flexion: hands to floor Extension: WNL and feels good Rot B: WNL Sidebend WNL  Hip AROM: WNL Hip PROM: painfree, some pull with cross body position MMT: all 4+/5 without pain Palpation: +1-2 ttp to Rt lateral hip at greater trochanter, +1 to left hip Tightness in  quads bil, bil lateral hip Lt>Rt Feels good with lumbar extension standing and prone  Posture: increased lumbar lordosis   LOWER EXTREMITY MMT:  08/25/24  4+ to 5/5 in B hips (flexors weaker)                                                                                                                                TREATMENT DATE:   10/04/24 Nustep L5 x 5 min with PT present to discuss status UEFS completed MMT shoulder Goals assessed Hip flexor stretch on stairs B x 30 sec Hip adductor stretch done at stairs, seated and in 1/2 kneel position on table x 30 sec ea Supine 90/90 hip flexor strengthening with yellow band around feet 2x10 B Eccentric B hip flexor strengthening in thomas test position      09/29/24 Pt seen for aquatic therapy today.  Treatment took place in water 3.5-4.75 ft in depth at the [facility] pool. Temp of water was 91.  Pt entered/exited the pool via stairs using step to pattern with hand rail.   Instruction and clarifications given with completion. Exercises - Hand buoy carry: Forward and Backward; bilaterally->unilaterally  - Side lunge with hand buoys   - Directv  - Sit to Stand Without Arm Support   - Standing Shoulder Horizontal Abduction Using Hand buoy   - [place]    - Standing Hip Circles at [employer]   - Standing Hip Abduction Adduction at [employer]  - Standing Hip Flexion Extension at [employer]  - Push-Off on Pool Wall   - Cycling on noodle/noodle wrapped under shoulders    PATIENT EDUCATION: Education details: per today's note Person educated: Patient Education method: Programmer, Multimedia, Facilities Manager, and Handouts Education comprehension: verbalized understanding and returned demonstration  HOME EXERCISE PROGRAM: Access Code: [email or web address] URL: [email or web address] Date: 05/31/2024 Prepared by: Orvil Beuhring  Exercises - Standing Shoulder Horizontal Abduction with Resistance  - 1 x daily - 3 x weekly - 1-3 sets - 10 reps - Standing Shoulder Row with Anchored Resistance  - 1 x daily - 3 x weekly - 1-3 sets - 10 reps - Shoulder extension with resistance - Neutral  - 1 x daily - 3 x weekly - 1-3 sets  - 10 reps - Standing Shoulder Single Arm PNF D2 Flexion with Resistance  - 1 x daily - 3 x weekly - 1-3 sets - 10 reps - Shoulder External Rotation and Scapular Retraction with Resistance  - 1 x daily - 7 x weekly - 1-3 sets - 10 reps - Doorway Pec Stretch at 60 Elevation  - 2 x daily - 7 x weekly - 1 sets - 2 reps - 30 sec hold - Seated Upper Trapezius Stretch  - 3 x daily - 7 x weekly - 1 sets - 1 reps - 30 hold - Seated Levator Scapulae Stretch  - 1 x daily - 7 x weekly - 1 sets -  1 reps - 30 hold - Cervical Retraction with Resistance  - 1 x daily - 7 x weekly - 1 sets - 3 reps - 10 hold  Patient Education - Office Posture  Access Code: [email or web address] URL: [email or web address] Date: 06/02/2024 Prepared by: Saddie Raw  Exercises - Supine Bridge  - 1 x daily - 7 x weekly - 1-3 sets - 10 reps - 20-30 seconds hold - Supine Lower Trunk Rotation  - 1 x daily - 7 x weekly - 1 sets - 10 reps - 5-6 sec hold - Supine Butterfly Groin Stretch  - 1 x daily - 7 x weekly - 1 sets - 2-3 reps - 20-30 seconds hold - Supine Posterior Pelvic Tilt  - 1 x daily - 7 x weekly - 1-3 sets - 10 reps - 20-30 seconds hold - Supine Figure 4 Piriformis Stretch  - 1 x daily - 7 x weekly - 2 sets - 3 reps - 20 seconds hold  Aquatic This aquatic home exercise program from Med[employer] utilizes pictures from land based exercises, but has been adapted prior to lamination and issuance.   Access Code: [email or web address] URL: [email or web address] Date: 09/21/2024 Prepared by: Frankie Ziemba  Exercises - Hand buoy carry: Forward and Backward; bilaterally->unilaterally  - 1 x daily - 1-3 x weekly - Side lunge with hand buoys  - 1 x daily - 1-3 x weekly - Noodle Press  - 1 x daily - 1-3 x weekly - 1-2 sets - 10 reps - Sit to Stand Without Arm Support  - 1 x daily - 1-3 x weekly - 1 sets - 10 reps - Standing Shoulder Horizontal Abduction Using Hand buoy  - 1 x daily - 1-3 x weekly - 1-2 sets - 10 reps - Bow  and Arrow   - 1 x daily - 1-3 x weekly - 1-2 sets - 10 reps - Standing Hip Circles at [employer]  - 1 x daily - 1-3 x weekly - 1-2 sets - 10 reps - Standing Hip Abduction Adduction at [employer]  - 1 x daily - 1-3 x weekly - 1-2 sets - 10 reps - Standing Hip Flexion Extension at [employer]  - 1 x daily - 1-3 x weekly - 1-2 sets - 10 reps - Push-Off on Pool Wall  - 1 x daily - 1-3 x weekly - 1-2 sets - 10 reps - Cycling on noodle/noodle wrapped under shoulders  - 1 x daily - 1-3 x weekly  ASSESSMENT:  CLINICAL IMPRESSION: ***     OBJECTIVE IMPAIRMENTS: decreased activity tolerance, decreased ROM, decreased strength, increased muscle spasms, impaired flexibility, impaired UE functional use, postural dysfunction, obesity, and pain.   ACTIVITY LIMITATIONS: carrying, lifting, standing, and sleeping  PARTICIPATION LIMITATIONS: meal prep, cleaning, laundry, community activity, occupation, and yard work  PERSONAL FACTORS: Time since onset of injury/illness/exacerbation and 1-2 comorbidities: B hip pain, HTN are also affecting patient's functional outcome.   REHAB POTENTIAL: Excellent  CLINICAL DECISION MAKING: Evolving/moderate complexity  EVALUATION COMPLEXITY: Moderate  GOALS: Goals reviewed with patient? Yes  SHORT TERM GOALS: Target date: 06/02/2024   Patient will be independent with initial HEP.  Baseline:  Goal status: MET 6/25  2.  Improved sleep by 50% secondary to decreased shoulder pain.  Baseline: 07/06/24 - 60% Goal status: MET  3.  Decreased shoulder pain by 30% with ADLs Baseline: 07/06/24 - 60% Goal status: MET   LONG TERM GOALS: Target date: 08/31/2024   Patient will be independent with advanced/ongoing  HEP to improve outcomes and carryover.  Baseline:  Goal status: MET for shoulder; IN PROGRESS for hip and core 9/19  2.  Patient able to carry and lift without increased shoulder pain. Baseline:  Goal status: MET 08/25/24  3.  Patient to demonstrate functional  neck ROM without pain provocation.  Baseline: 07/06/24 - pt reports still tight but no more pain Goal status: MET  4.  Patient will demonstrate improved R shoulder and scapular strength to 5/5 in able to lift and carry without difficulty.  Baseline: See above MMT chart Goal status: PARTIALLY MET 08/25/24  5. Increased UEFS score to 72 or better demonstrating improved functional ability.  Baseline: 63; 07/06/24 - 66 Goal status: NOT MET  08/25/24 70  6. Pt will be ind with final HEP for hip stretching, core mobility, and postural changes for the neck and low back pain to maximize impact of posture on pain.  Baseline: No knowledge; 07/06/24 - Pt is independent with initial HEP for neck stretches, today progressed HEP to include cervical stability and hip flexibility along with initial core strength Goal Status: MET  for neck; IN PROGRESS for LE/core 08/25/24  7. Patient able to tolerate walking for 30 min without needing to stop due to hip pain.   Baseline:   Goal status: In Progress 09/19/24  8.  Patient will report decreased catch and pain in the hip by 50% with sit to walk transitions. Baseline:  Goal status: NEW   PLAN: PT FREQUENCY: 1-2 x wk  PT DURATION: 6-7 sessions  PLANNED INTERVENTIONS: 97164- PT Re-evaluation, 97110-Therapeutic exercises, 97530- Therapeutic activity, 97112- Neuromuscular re-education, 97535- Self Care, [id number]- Manual therapy, [phone]- Aquatic Therapy, [id number]- Electrical stimulation (unattended), 20560 (1-2 muscles), 20561 (3+ muscles)- Dry Needling, Patient/Family education, Joint mobilization, Spinal mobilization, Cryotherapy, and Moist heat  PLAN FOR NEXT SESSION: Return to land with possible DC.  Aquatics complete  Mliss Cummins, PT

## 2024-10-04 ENCOUNTER — Ambulatory Visit: Payer: Self-pay | Admitting: Physical Therapy

## 2024-10-04 ENCOUNTER — Encounter: Payer: Self-pay | Admitting: Physical Therapy

## 2024-10-04 DIAGNOSIS — M25551 Pain in right hip: Secondary | ICD-10-CM

## 2024-10-04 DIAGNOSIS — M6281 Muscle weakness (generalized): Secondary | ICD-10-CM

## 2024-10-04 DIAGNOSIS — R252 Cramp and spasm: Secondary | ICD-10-CM

## 2024-10-04 DIAGNOSIS — G8929 Other chronic pain: Secondary | ICD-10-CM

## 2024-10-04 DIAGNOSIS — M25552 Pain in left hip: Secondary | ICD-10-CM | POA: Diagnosis not present

## 2024-10-04 DIAGNOSIS — M542 Cervicalgia: Secondary | ICD-10-CM

## 2024-10-04 DIAGNOSIS — M25511 Pain in right shoulder: Secondary | ICD-10-CM | POA: Diagnosis not present

## 2024-10-07 DIAGNOSIS — F3289 Other specified depressive episodes: Secondary | ICD-10-CM | POA: Diagnosis not present

## 2024-10-07 DIAGNOSIS — F1020 Alcohol dependence, uncomplicated: Secondary | ICD-10-CM | POA: Diagnosis not present

## 2024-10-09 ENCOUNTER — Other Ambulatory Visit: Payer: Self-pay | Admitting: General Practice

## 2024-10-09 ENCOUNTER — Encounter: Payer: Self-pay | Admitting: Radiology

## 2024-10-10 MED ORDER — TELMISARTAN-HCTZ 80-25 MG PO TABS: 1.0000 | ORAL_TABLET | Freq: Every day | ORAL | 1 refills | Status: AC

## 2024-10-12 DIAGNOSIS — F1020 Alcohol dependence, uncomplicated: Secondary | ICD-10-CM | POA: Diagnosis not present

## 2024-10-12 DIAGNOSIS — F3289 Other specified depressive episodes: Secondary | ICD-10-CM | POA: Diagnosis not present

## 2024-10-20 DIAGNOSIS — F3289 Other specified depressive episodes: Secondary | ICD-10-CM | POA: Diagnosis not present

## 2024-10-20 DIAGNOSIS — F1020 Alcohol dependence, uncomplicated: Secondary | ICD-10-CM | POA: Diagnosis not present

## 2024-10-21 ENCOUNTER — Other Ambulatory Visit: Payer: Self-pay | Admitting: Nurse Practitioner

## 2024-10-21 DIAGNOSIS — R7309 Other abnormal glucose: Secondary | ICD-10-CM

## 2024-10-23 ENCOUNTER — Other Ambulatory Visit: Payer: Self-pay

## 2024-10-24 ENCOUNTER — Other Ambulatory Visit (HOSPITAL_BASED_OUTPATIENT_CLINIC_OR_DEPARTMENT_OTHER): Payer: Self-pay

## 2024-10-24 MED ORDER — METFORMIN HCL 500 MG PO TABS
500.0000 mg | ORAL_TABLET | Freq: Two times a day (BID) | ORAL | 2 refills | Status: AC
  Filled 2024-10-24: qty 60, 30d supply, fill #0
  Filled 2024-12-24: qty 60, 30d supply, fill #1

## 2024-10-28 DIAGNOSIS — F3289 Other specified depressive episodes: Secondary | ICD-10-CM | POA: Diagnosis not present

## 2024-10-28 DIAGNOSIS — F1020 Alcohol dependence, uncomplicated: Secondary | ICD-10-CM | POA: Diagnosis not present

## 2024-11-10 DIAGNOSIS — F3289 Other specified depressive episodes: Secondary | ICD-10-CM | POA: Diagnosis not present

## 2024-11-10 DIAGNOSIS — F1020 Alcohol dependence, uncomplicated: Secondary | ICD-10-CM | POA: Diagnosis not present

## 2024-11-16 DIAGNOSIS — F3289 Other specified depressive episodes: Secondary | ICD-10-CM | POA: Diagnosis not present

## 2024-11-16 DIAGNOSIS — F1020 Alcohol dependence, uncomplicated: Secondary | ICD-10-CM | POA: Diagnosis not present

## 2024-11-23 DIAGNOSIS — F1020 Alcohol dependence, uncomplicated: Secondary | ICD-10-CM | POA: Diagnosis not present

## 2024-11-23 DIAGNOSIS — F3289 Other specified depressive episodes: Secondary | ICD-10-CM | POA: Diagnosis not present

## 2024-12-25 ENCOUNTER — Other Ambulatory Visit: Payer: Self-pay

## 2024-12-25 ENCOUNTER — Other Ambulatory Visit (HOSPITAL_BASED_OUTPATIENT_CLINIC_OR_DEPARTMENT_OTHER): Payer: Self-pay

## 2024-12-29 ENCOUNTER — Other Ambulatory Visit (HOSPITAL_BASED_OUTPATIENT_CLINIC_OR_DEPARTMENT_OTHER): Payer: Self-pay

## 2025-01-02 ENCOUNTER — Ambulatory Visit: Admitting: Nurse Practitioner

## 2025-04-12 ENCOUNTER — Encounter: Payer: Self-pay | Admitting: Nurse Practitioner
